# Patient Record
Sex: Female | Born: 1960 | Hispanic: No | Smoking: Light tobacco smoker
Health system: Southern US, Community
[De-identification: ages and names within clinical notes are randomized; demographics above are authoritative.]

## PROBLEM LIST (undated history)

## (undated) DIAGNOSIS — F419 Anxiety disorder, unspecified: Secondary | ICD-10-CM

## (undated) DIAGNOSIS — E119 Type 2 diabetes mellitus without complications: Secondary | ICD-10-CM

## (undated) DIAGNOSIS — I10 Essential (primary) hypertension: Secondary | ICD-10-CM

## (undated) HISTORY — PX: CHOLECYSTECTOMY: SHX55

## (undated) HISTORY — PX: APPENDECTOMY: SHX54

---

## 2004-05-29 ENCOUNTER — Encounter (INDEPENDENT_AMBULATORY_CARE_PROVIDER_SITE_OTHER): Payer: Self-pay | Admitting: Specialist

## 2004-05-29 ENCOUNTER — Ambulatory Visit (HOSPITAL_COMMUNITY): Admission: RE | Admit: 2004-05-29 | Discharge: 2004-05-29 | Payer: Self-pay | Admitting: Obstetrics and Gynecology

## 2004-09-28 ENCOUNTER — Encounter: Admission: RE | Admit: 2004-09-28 | Discharge: 2004-09-28 | Payer: Self-pay | Admitting: Family Medicine

## 2004-10-19 ENCOUNTER — Ambulatory Visit: Admission: RE | Admit: 2004-10-19 | Discharge: 2004-10-19 | Payer: Self-pay | Admitting: General Surgery

## 2004-10-19 ENCOUNTER — Encounter (INDEPENDENT_AMBULATORY_CARE_PROVIDER_SITE_OTHER): Payer: Self-pay | Admitting: Specialist

## 2004-12-19 ENCOUNTER — Other Ambulatory Visit: Admission: RE | Admit: 2004-12-19 | Discharge: 2004-12-19 | Payer: Self-pay | Admitting: Obstetrics and Gynecology

## 2005-02-08 ENCOUNTER — Encounter: Admission: RE | Admit: 2005-02-08 | Discharge: 2005-02-08 | Payer: Self-pay | Admitting: Family Medicine

## 2006-07-29 ENCOUNTER — Other Ambulatory Visit: Admission: RE | Admit: 2006-07-29 | Discharge: 2006-07-29 | Payer: Self-pay | Admitting: Obstetrics and Gynecology

## 2007-01-29 IMAGING — CR DG KNEE 1-2V*R*
3 series · 3 of 3 positions shown · non-contrast
Comparison: none

CLINICAL DATA: Right knee pain and swelling for two days.  No acute injury.  
 RIGHT KNEE TWO VIEWS:
 No comparison.  The frontal exam was repeated.  There is a moderate sized knee joint effusion.  There is mild medial and patellar femoral joint space loss with osteophytes.  No acute fracture or dislocation is seen.

[view not recorded (1 of 3)]
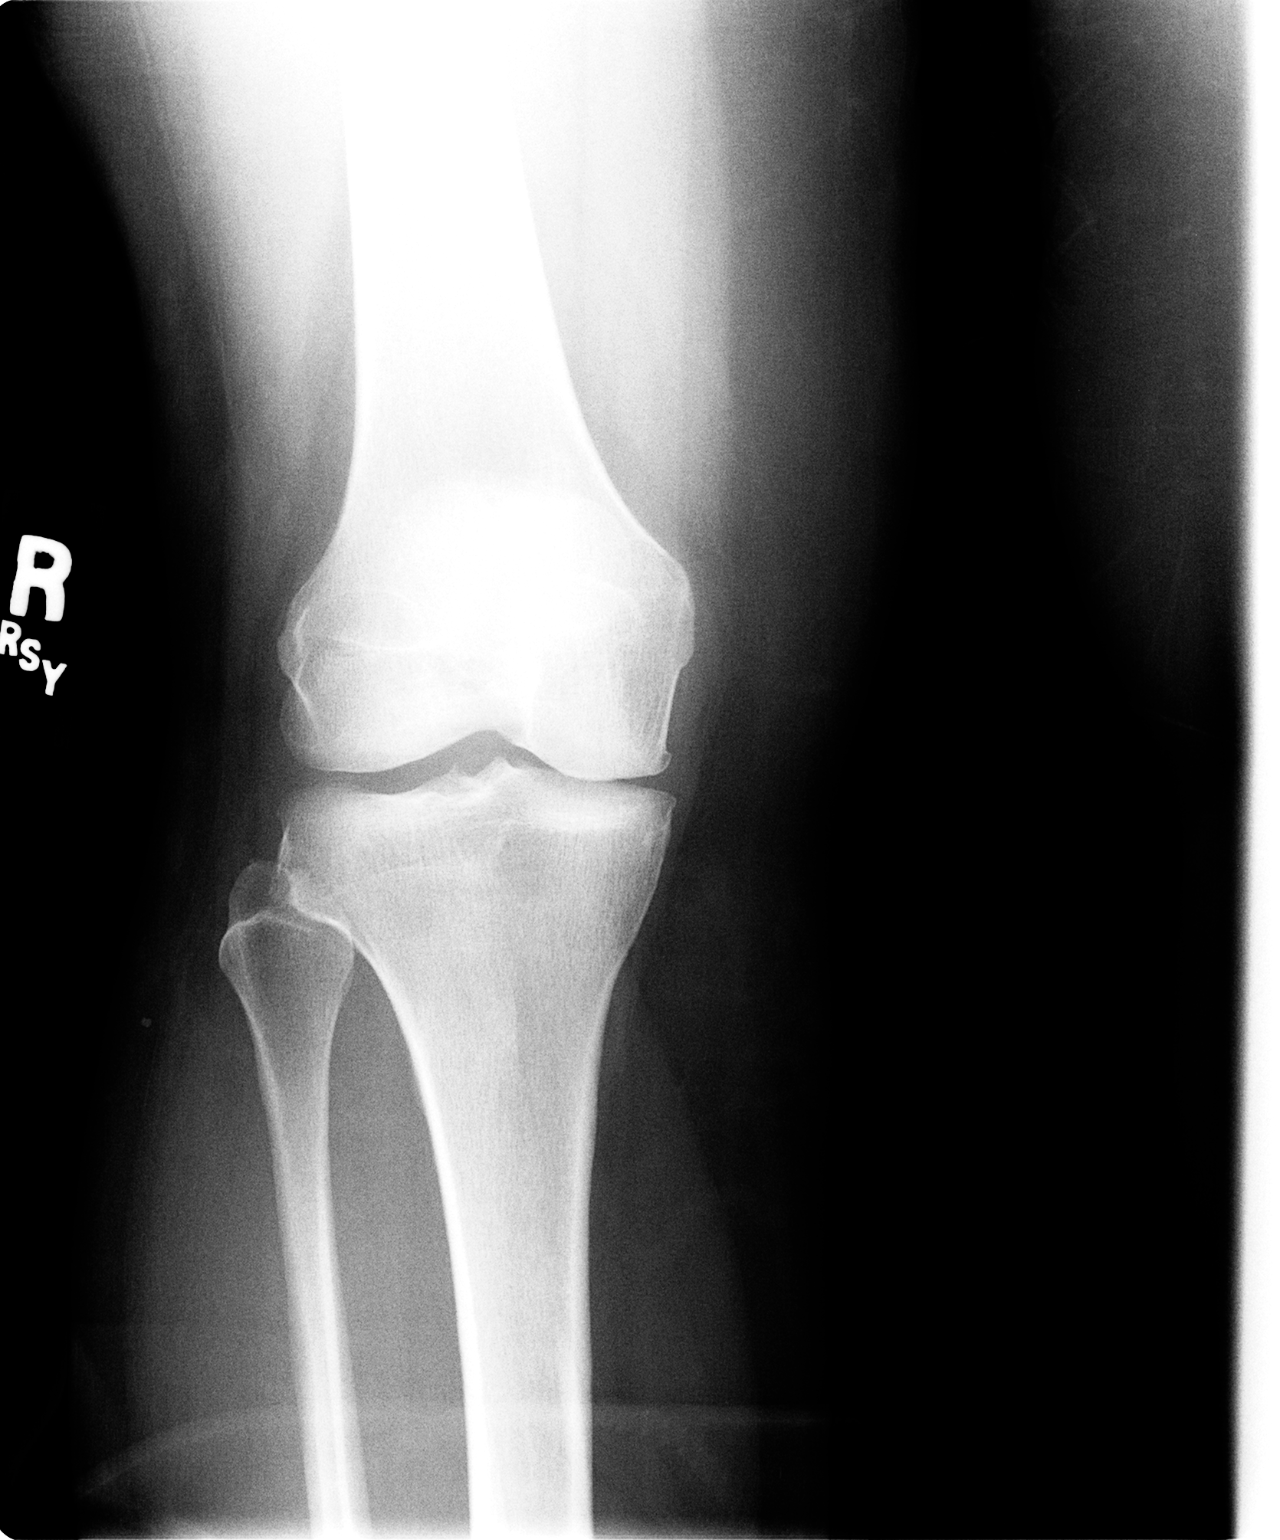

[view not recorded (2 of 3)]
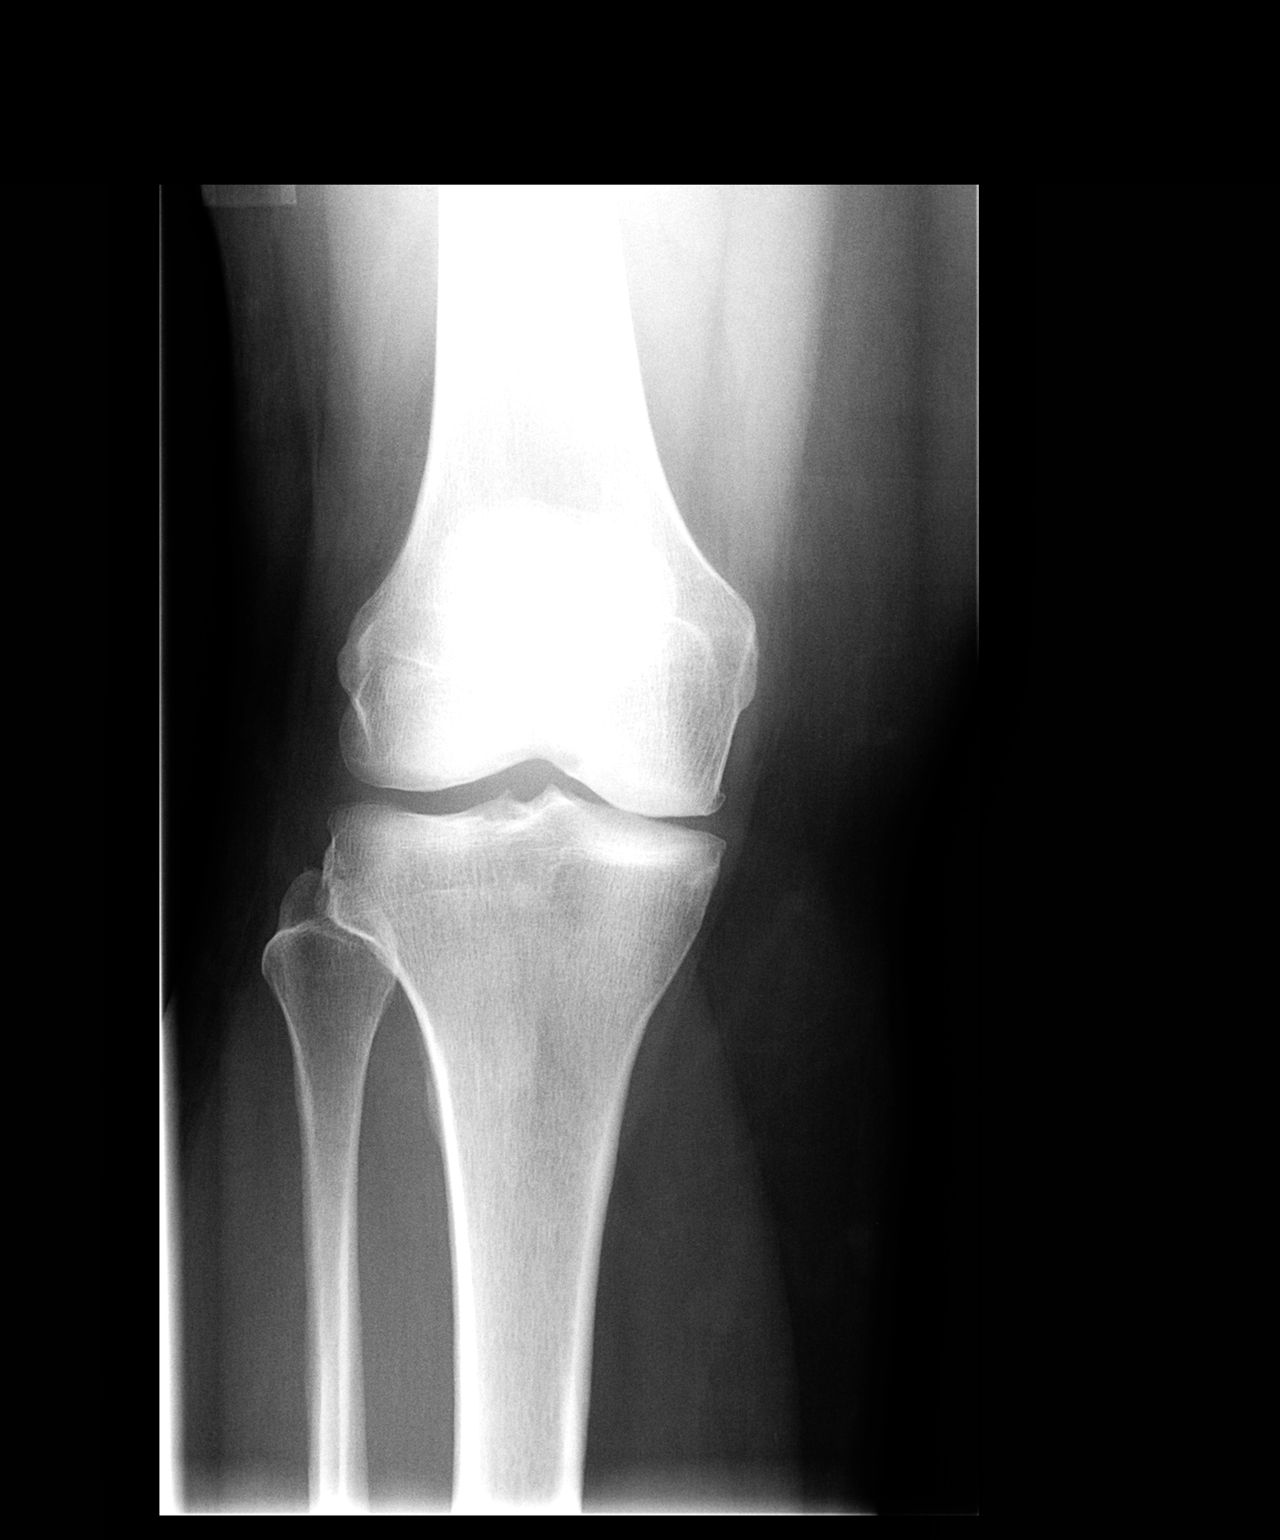

[view not recorded (3 of 3)]
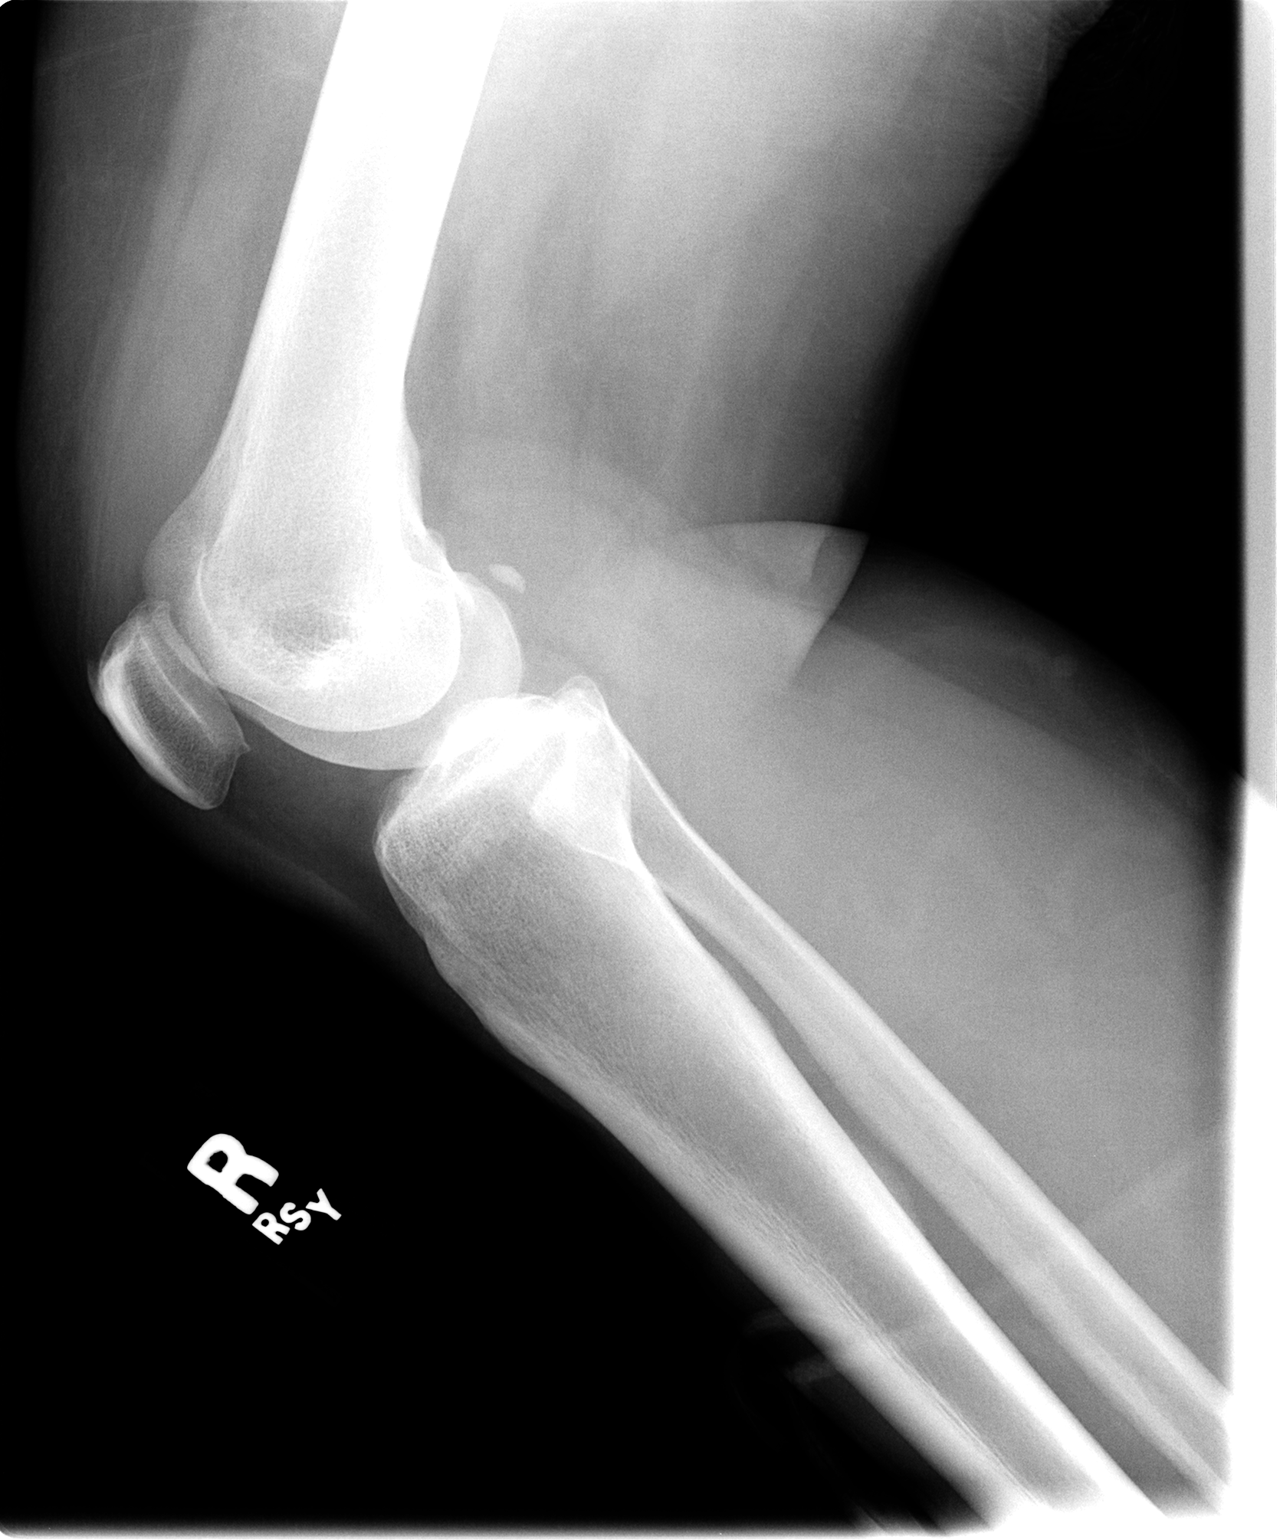

[3 of 3 positions shown; findings below may reference images not displayed]

IMPRESSION: Knee joint effusion with mild degenerative changes.  No acute osseous findings.

## 2010-07-07 NOTE — Op Note (Signed)
Angela Burch, Angela Burch               ACCOUNT NO.:  [id number]   MEDICAL RECORD NO.:  [id number]          PATIENT TYPE:  AMB   LOCATION:  DAY                          FACILITY:  [facility]   PHYSICIAN:  Cynthia P. Romine, M.D.DATE OF BIRTH:  [birth year]   DATE OF PROCEDURE:  05/29/2004  DATE OF DISCHARGE:                                 OPERATIVE REPORT   PREOPERATIVE DIAGNOSES:  Menorrhagia, submucous fibroid.   POSTOPERATIVE DIAGNOSES:  Menorrhagia, submucous fibroid, pathology pending.   PROCEDURE:  Endometrial ablation using HTA and a hysteroscopic resection of  submucous myoma.   SURGEON:  Cynthia P. Romine, M.D.   ANESTHESIA:  General by LMA   ESTIMATED BLOOD LOSS:  Minimal.   COMPLICATIONS:  None.   DESCRIPTION OF PROCEDURE:  The patient was taken to the operating room, and  after the induction of adequate general anesthesia by LMA, she was placed in  the dorsal lithotomy position and prepped and draped in the usual fashion.  The bladder was drained with a red rubber catheter.  A posterior weighted  and an anterior Sims retractor were placed.  The cervix was grasped on its  anterior lip with a single-tooth tenaculum.  The uterus was sounded to 7 cm.  The cervix was dilated to a #23 Shawnie Pons.  The ablated hysteroscope was  introduced.  Proper documentation was noted by seeing the tubal ostia and  the submucous fibroid.  The submucous fibroid was approximately 2 cm, and  originated from the lower part of the anterior uterine segment.  The  hysteroscope was withdrawn to just inside the endo-cervical os, and an  endometrial ablation was carried out using the endometrial hydrotherm  ablator without difficulty, according to the usual fashion.  Following a 10-  minute ablation, the fundus was inspected and it was felt that there were  areas within the fundus that were not adequately ablated.  A two-minute re-  ablation was then done with good results.  The HTA scope was now withdrawn.  The standard operative hysteroscope was then inserted.  Sorbitol was used as  the distention medium.  The pressure pump was set on 70 mmHg.  A single loop  was used to resect the lower uterine segment anterior fibroid in pieces.  The specimen was sent to pathology.  After removing the fibroid, the scope  was withdrawn.  A sharp curettage was done to remove some fragments of  ablated tissue that were shaggy in the endometrial cavity.  The scope was  then put back in.  The cavity was cleaned up with a single loop to make the  site where the fibroid was smooth.  The bleeding sites were  coagulated.  The scope was then withdrawn.  The instruments were removed  from the vagina.  The procedure was terminated.   The patient tolerated the procedure well and went in satisfactory condition  to the post-anesthesia recovery.      CPR/MEDQ  D:  05/29/2004  T:  05/29/2004  Job:  [id number]

## 2010-07-07 NOTE — Op Note (Signed)
NAMEHADYN, Angela Burch NO.:  [id number]   MEDICAL RECORD NO.:  [id number]          PATIENT TYPE:  INP   LOCATION:  0005                         FACILITY:  [facility]   PHYSICIAN:  Leonie Man, M.D.   DATE OF BIRTH:  [birth year]   DATE OF PROCEDURE:  10/19/2004  DATE OF DISCHARGE:                                 OPERATIVE REPORT   PREOPERATIVE DIAGNOSIS:  Chronic calculous cholecystitis.   POSTOPERATIVE DIAGNOSIS:  Chronic calculous cholecystitis.   PROCEDURES:  Laparoscopic cholecystectomy with intraoperative cholangiogram.   SURGEON:  Leonie Man, M.D.   ASSISTANT:  Avel Peace, M.D.   ANESTHESIA:  General.   SPECIMENS TO LAB:  Gallbladder with stones.   ESTIMATED BLOOD LOSS:  Minimal.   COMPLICATIONS:  No complications apparent.   The patient returned to the PACU in good condition.   Note, Angela Burch is a 50 year old diabetic woman with longstanding right  upper quadrant symptoms associated with fatty food intolerance. She had an  ultrasound which shows that she has a single gallstone impacted in the neck  of the gallbladder. She comes to the operating room after the risk and  potential benefits of surgery have been discussed. All questions answered  and consent obtained.   DESCRIPTION OF PROCEDURE:  Following the induction of satisfactory general  anesthesia with the patient positioned supinely, the abdomen was routinely  prepped and draped to be included in a sterile operative field. Open  laparoscopy is then created at the umbilicus with insertion of a Hassan  cannula, insufflation of the peritoneal cavity to 14 mmHg pressure using  carbon dioxide. The camera was then inserted and visual exploration of the  abdomen carried out. There were a few adhesions in the pelvis. The liver  edges were somewhat fatty in appearance. The gallbladder was chronically  scarred with multiple adhesions up to the wall of the gallbladder. Under  direct vision,  epigastric and lateral ports were placed, the gallbladder is  grasped and retracted cephalad. Dissection carried down to the region of the  ampulla of the gallbladder, isolated the cystic artery and cystic duct. The  cystic duct was clipped proximally and opened and the cystic duct  cholangiogram was carried out with a Cook catheter which was inserted into  the cystic duct with the injection of one-half strength Hypaque into the  biliary system. The resulting cholangiogram showed free flow of contrast  into the duodenum with no filling defects and normal caliber ductal system.  The cystic duct was then doubly clipped and transected, the cystic artery  was dissected free, doubly clipped and transected and the gallbladder  dissected free from the liver bed using electrocautery and maintaining  hemostasis throughout the course of the dissection. At the end of the  dissection, additional bleeding points within the liver bed were treated  with electrocautery. The camera was then moved to the epigastric port, the  gallbladder was retrieved through the umbilical port without difficulty. The  right upper quadrant was again thoroughly irrigated and sucked dry. Sponge,  instrument and sharp counts were verified. The pneumoperitoneum allowed to  deflate  and the wound was closed in layers as follows:  The umbilical wound  closed in two layers with #0 Vicryl and 4-0 Monocryl. The epigastric and  lateral flank wounds were closed with 4-0 Monocryl sutures and all wounds  reinforced with Steri-Strips and a sterile dressing was applied. Anesthetic  reversed, patient removed from the operating room to the recovery room in  stable condition. She tolerated the procedure well.           ______________________________  Leonie Man, M.D.     PB/MEDQ  D:  10/19/2004  T:  10/19/2004  Job:  [id number]

## 2011-06-06 ENCOUNTER — Telehealth (INDEPENDENT_AMBULATORY_CARE_PROVIDER_SITE_OTHER): Payer: Self-pay

## 2011-06-07 NOTE — Telephone Encounter (Signed)
Encounter opened in error

## 2011-12-15 ENCOUNTER — Emergency Department (HOSPITAL_COMMUNITY)
Admission: EM | Admit: 2011-12-15 | Discharge: 2011-12-15 | Disposition: A | Discharge status: Home / Self Care | Payer: Self-pay | Attending: Emergency Medicine | Admitting: Emergency Medicine

## 2011-12-15 ENCOUNTER — Encounter (HOSPITAL_COMMUNITY): Payer: Self-pay | Admitting: Emergency Medicine

## 2011-12-15 DIAGNOSIS — R350 Frequency of micturition: Secondary | ICD-10-CM | POA: Insufficient documentation

## 2011-12-15 DIAGNOSIS — F329 Major depressive disorder, single episode, unspecified: Secondary | ICD-10-CM | POA: Insufficient documentation

## 2011-12-15 DIAGNOSIS — F17200 Nicotine dependence, unspecified, uncomplicated: Secondary | ICD-10-CM | POA: Insufficient documentation

## 2011-12-15 DIAGNOSIS — R404 Transient alteration of awareness: Secondary | ICD-10-CM | POA: Insufficient documentation

## 2011-12-15 DIAGNOSIS — W06XXXA Fall from bed, initial encounter: Secondary | ICD-10-CM | POA: Insufficient documentation

## 2011-12-15 DIAGNOSIS — E162 Hypoglycemia, unspecified: Secondary | ICD-10-CM

## 2011-12-15 DIAGNOSIS — Y929 Unspecified place or not applicable: Secondary | ICD-10-CM | POA: Insufficient documentation

## 2011-12-15 DIAGNOSIS — F3289 Other specified depressive episodes: Secondary | ICD-10-CM | POA: Insufficient documentation

## 2011-12-15 DIAGNOSIS — I10 Essential (primary) hypertension: Secondary | ICD-10-CM | POA: Insufficient documentation

## 2011-12-15 DIAGNOSIS — E1169 Type 2 diabetes mellitus with other specified complication: Secondary | ICD-10-CM | POA: Insufficient documentation

## 2011-12-15 DIAGNOSIS — Y939 Activity, unspecified: Secondary | ICD-10-CM | POA: Insufficient documentation

## 2011-12-15 HISTORY — DX: Type 2 diabetes mellitus without complications: E11.9

## 2011-12-15 HISTORY — DX: Essential (primary) hypertension: I10

## 2011-12-15 LAB — CBC WITH DIFFERENTIAL/PLATELET
Basophils Absolute: 0 10*3/uL (ref 0.0–0.1)
Eosinophils Relative: 1 % (ref 0–5)
HCT: 42.3 % (ref 36.0–46.0)
Hemoglobin: 14.2 g/dL (ref 12.0–15.0)
Lymphocytes Relative: 22 % (ref 12–46)
MCHC: 33.6 g/dL (ref 30.0–36.0)
MCV: 81.7 fL (ref 78.0–100.0)
Monocytes Absolute: 0.6 10*3/uL (ref 0.1–1.0)
Monocytes Relative: 6 % (ref 3–12)
Neutro Abs: 6.6 10*3/uL (ref 1.7–7.7)
RDW: 14.3 % (ref 11.5–15.5)
WBC: 9.3 10*3/uL (ref 4.0–10.5)

## 2011-12-15 LAB — BASIC METABOLIC PANEL
BUN: 11 mg/dL (ref 6–23)
CO2: 19 mEq/L (ref 19–32)
Calcium: 8.9 mg/dL (ref 8.4–10.5)
Chloride: 102 mEq/L (ref 96–112)
Creatinine, Ser: 0.45 mg/dL — ABNORMAL LOW (ref 0.50–1.10)

## 2011-12-15 LAB — URINALYSIS, ROUTINE W REFLEX MICROSCOPIC
Bilirubin Urine: NEGATIVE
Glucose, UA: 250 mg/dL — AB
Hgb urine dipstick: NEGATIVE
Ketones, ur: NEGATIVE mg/dL
Protein, ur: NEGATIVE mg/dL
pH: 5 (ref 5.0–8.0)

## 2011-12-15 LAB — GLUCOSE, CAPILLARY
Glucose-Capillary: 92 mg/dL (ref 70–99)
Glucose-Capillary: 128 mg/dL — ABNORMAL HIGH (ref 70–99)
Glucose-Capillary: 72 mg/dL (ref 70–99)

## 2011-12-15 LAB — RAPID URINE DRUG SCREEN, HOSP PERFORMED
Cocaine: NOT DETECTED
Opiates: NOT DETECTED
Tetrahydrocannabinol: NOT DETECTED

## 2011-12-15 MED ORDER — SODIUM CHLORIDE 0.9 % IV SOLN
1000.0000 mL | Freq: Once | INTRAVENOUS | Status: AC
  Administered 2011-12-15: 1000 mL via INTRAVENOUS

## 2011-12-15 MED ORDER — LORAZEPAM 1 MG PO TABS
1.0000 mg | ORAL_TABLET | Freq: Once | ORAL | Status: AC
  Administered 2011-12-15: 1 mg via ORAL
  Filled 2011-12-15: qty 1

## 2011-12-15 MED ORDER — LISINOPRIL 10 MG PO TABS
10.0000 mg | ORAL_TABLET | Freq: Every day | ORAL | Status: DC
  Administered 2011-12-15: 10 mg via ORAL
  Filled 2011-12-15: qty 1

## 2011-12-15 MED ORDER — SODIUM CHLORIDE 0.9 % IV SOLN: 1000.0000 mL | INTRAVENOUS | Status: DC

## 2011-12-15 MED ORDER — ALUM & MAG HYDROXIDE-SIMETH 200-200-20 MG/5ML PO SUSP: 30.0000 mL | ORAL | Status: DC | PRN

## 2011-12-15 MED ORDER — PAROXETINE HCL 20 MG PO TABS
40.0000 mg | ORAL_TABLET | Freq: Every day | ORAL | Status: DC
  Administered 2011-12-15: 40 mg via ORAL
  Filled 2011-12-15: qty 2

## 2011-12-15 MED ORDER — CLONAZEPAM 0.5 MG PO TABS
1.0000 mg | ORAL_TABLET | ORAL | Status: AC
  Administered 2011-12-15: 1 mg via ORAL
  Filled 2011-12-15: qty 2

## 2011-12-15 MED ORDER — IBUPROFEN 200 MG PO TABS: 600.0000 mg | ORAL_TABLET | Freq: Three times a day (TID) | ORAL | Status: DC | PRN

## 2011-12-15 MED ORDER — NICOTINE 21 MG/24HR TD PT24
21.0000 mg | MEDICATED_PATCH | Freq: Every day | TRANSDERMAL | Status: DC
  Filled 2011-12-15: qty 1

## 2011-12-15 MED ORDER — CLONAZEPAM 0.5 MG PO TABS: 1.0000 mg | ORAL_TABLET | Freq: Three times a day (TID) | ORAL | Status: DC | PRN

## 2011-12-15 MED ORDER — SODIUM CHLORIDE 0.9 % IV SOLN: 1000.0000 mL | Freq: Once | INTRAVENOUS | Status: DC

## 2011-12-15 MED ORDER — ZOLPIDEM TARTRATE 5 MG PO TABS: 5.0000 mg | ORAL_TABLET | Freq: Every evening | ORAL | Status: DC | PRN

## 2011-12-15 MED ORDER — ACETAMINOPHEN 325 MG PO TABS: 650.0000 mg | ORAL_TABLET | ORAL | Status: DC | PRN

## 2011-12-15 MED ORDER — ONDANSETRON HCL 4 MG PO TABS: 4.0000 mg | ORAL_TABLET | Freq: Three times a day (TID) | ORAL | Status: DC | PRN

## 2011-12-15 MED ORDER — BUPROPION HCL 100 MG PO TABS
100.0000 mg | ORAL_TABLET | Freq: Two times a day (BID) | ORAL | Status: DC
  Filled 2011-12-15: qty 1

## 2011-12-15 MED ORDER — INSULIN NPH (HUMAN) (ISOPHANE) 100 UNIT/ML ~~LOC~~ SUSP: 40.0000 [IU] | Freq: Two times a day (BID) | SUBCUTANEOUS | Status: DC

## 2011-12-15 MED ORDER — LORAZEPAM 1 MG PO TABS: 1.0000 mg | ORAL_TABLET | Freq: Three times a day (TID) | ORAL | Status: DC | PRN

## 2011-12-15 MED ORDER — AMPHETAMINE-DEXTROAMPHETAMINE 10 MG PO TABS: 30.0000 mg | ORAL_TABLET | Freq: Two times a day (BID) | ORAL | Status: DC

## 2011-12-15 NOTE — ED Notes (Signed)
MD at bedside. 

## 2011-12-15 NOTE — ED Notes (Signed)
AVW:[id number]<XB> Expected date:12/15/11<BR> Expected time:10:36 AM<BR> Means of arrival:Ambulance<BR> Comments:<BR> Hypoglycemia

## 2011-12-15 NOTE — ED Provider Notes (Signed)
History     CSN: [id number]  Arrival date & time 12/15/11  1034   First MD Initiated Contact with Patient 12/15/11 1051      Chief Complaint  Patient presents with  . Hypoglycemia    (Consider location/radiation/quality/duration/timing/severity/associated sxs/prior treatment) HPI Comments: Patient was found unresponsive this morning by friend who heard her fall out of bed this morning.  EMS was called and found her cbg to be 18.  Daughter was also present, states patient was clammy, cold, and moaning.  EMS gave D50 and cbg increased to 93.  Daughter reports patient takes novolin 60 units BID.  Patient last remembers taking novolin yesterday morning.  Yesterday she ate toast and cereal for breakfast and pasta with sausage for dinner.  Pt has had one prior episode of hypoglycemia last year, cbg found to be 23 but pt states she was asymptomatic.  Pt notes she has had urinary frequency over the past few days.  Currently feels tired and cold.  Denies fevers, sore throat, cough, SOB, CP, abdominal pain, N/V/D, urinary urgency or dysuria.  Daughter notes patient's blood glucose is usually in the mid-100s, though she does not check it very often.   The history is provided by the patient and a relative.    Past Medical History  Diagnosis Date  . Diabetes mellitus without complication   . Hypertension     History reviewed. No pertinent past surgical history.  No family history on file.  History  Substance Use Topics  . Smoking status: Light Tobacco Smoker  . Smokeless tobacco: Not on file  . Alcohol Use: No    OB History    Grav Para Term Preterm Abortions TAB SAB Ect Mult Living                  Review of Systems  Constitutional: Negative for fever and chills.  HENT: Negative for sore throat.   Respiratory: Negative for cough and shortness of breath.   Cardiovascular: Negative for chest pain.  Gastrointestinal: Negative for nausea, vomiting, abdominal pain and diarrhea.    Genitourinary: Positive for frequency. Negative for dysuria, urgency, vaginal bleeding and vaginal discharge.  Psychiatric/Behavioral: Positive for confusion.    Allergies  Sulfa antibiotics  Home Medications  No current outpatient prescriptions on file.  BP 128/92  Pulse 56  Temp 97.5 F (36.4 C) (Rectal)  Resp 16  SpO2 100%  Physical Exam  Nursing note and vitals reviewed. Constitutional: She appears well-developed and well-nourished. No distress.  HENT:  Head: Normocephalic and atraumatic.  Neck: Neck supple.  Cardiovascular: Normal rate and regular rhythm.   Pulmonary/Chest: Effort normal and breath sounds normal. No respiratory distress. She has no wheezes. She has no rales.  Abdominal: Soft. She exhibits no distension. There is no tenderness. There is no rebound and no guarding.  Neurological: She is alert. She has normal strength. No cranial nerve deficit or sensory deficit. She exhibits normal muscle tone. Coordination and gait normal. GCS eye subscore is 4. GCS verbal subscore is 5. GCS motor subscore is 6.       CN II-XII intact, EOMs intact, no pronator drift, grip strengths equal bilaterally; strength 5/5 in all extremities, sensation intact in all extremities; finger to nose, heel to shin, rapid alternating movements normal; gait is normal.    Pt is alert and cooperative, answers all questions, but appears mildly confused in terms of short term memory (e.g. yesterday's events).    Skin: She is not diaphoretic.  ED Course  Procedures (including critical care time)  Labs Reviewed  CBC WITH DIFFERENTIAL - Abnormal; Notable for the following:    RBC 5.18 (*)     All other components within normal limits  BASIC METABOLIC PANEL - Abnormal; Notable for the following:    Glucose, Bld 69 (*)     Creatinine, Ser 0.45 (*)     All other components within normal limits  URINALYSIS, ROUTINE W REFLEX MICROSCOPIC - Abnormal; Notable for the following:    APPearance  CLOUDY (*)     Glucose, UA 250 (*)     All other components within normal limits  GLUCOSE, CAPILLARY - Abnormal; Notable for the following:    Glucose-Capillary 119 (*)     All other components within normal limits  GLUCOSE, CAPILLARY - Abnormal; Notable for the following:    Glucose-Capillary 134 (*)     All other components within normal limits  GLUCOSE, CAPILLARY - Abnormal; Notable for the following:    Glucose-Capillary 128 (*)     All other components within normal limits  URINE RAPID DRUG SCREEN (HOSP PERFORMED) - Abnormal; Notable for the following:    Benzodiazepines POSITIVE (*)     Amphetamines POSITIVE (*)     All other components within normal limits  GLUCOSE, CAPILLARY  ETHANOL  GLUCOSE, CAPILLARY   No results found.  11:22 AM Pt discussed with Dr Ethelda Chick who will also see the patient.   1:11 PM Patient reports she is having a panic attack, is crying.  States she feels sad, wants to see her mom (she is dead).  States she is depressed.  Denies SI.  Pt does have a hx panic attacks.   3:38 PM Daughter asked to speak to me privately, states that patient has repeatedly said that she wants to go see her mother (who is dead) and is saying things like "I'm ready for this life to be over."  Daughter also notes that 1-2 weeks ago, patient told her she took 300 units of insulin and the next morning states "why the hell did I wake up?  I shouldn't be here.  I took 300 units of insulin."  Daughter is concerned and does not feel comfortable with patient being discharged and does not feel comfortable leaving patient alone.  I have discussed this with Dr Ethelda Chick and will keep patient, concern for suicidal ideation, possible suicide attempt last night given cbg 18 this morning.    3:46 PM I spoke with Aurther Loft, ACT team, who will add patient to her list for assessment.   5:24 PM Pt was attempting to leave.  I have informed her that she may stay voluntarily or we will fill out IVC papers  for her to stay.  Pt agrees to stay.    7:54 PM Patient spoke with telepsych. Psychiatrist recommends discharge.  Discussed with Dr Rhunette Croft who agrees with following psychiatrist's recommendations.        1. Hypoglycemia   2. Depression     MDM  Patient found unresponsive this morning, cbg found to be 18.  Patient given D50 by EMS with return to normal function and mental status.  Pt ate in ED and has maintained normal blood glucose throughout visit.  There was concern expressed by the daughter of suicidal ideas given attempted suicide last week.  Pt spoke to telepsych MD who recommends discharge home.  Pt instructed to follow up with her PCP, given resources as backup, also given information for [facility].  Per  telepsych, pt has "ANP" that she sees for her depression, instructed to follow up with this person.  Discussed plan with patient and family members.  Family members state they will monitor patient closely.  Pt advised to change her insulin dosage, follow closely with PCP.  Pt given return precautions.  Pt verbalizes understanding and agrees with plan.           [place], [place] 12/15/11 2042

## 2011-12-15 NOTE — ED Provider Notes (Addendum)
Patient found unresponsive this morning. Found to have blood sugar of 18 by EMS treated with D50. Patient presently asymptomatic alert Glasgow Coma Score 15. She did denies missing recent meals and denies recent changes in insulin regimen  Doug Sou, MD 12/15/11 1143  Further history patient reports that she has been sad lately over the lost her job and other issues at home. She denies suicidal ideation but she did attempt to harm herself one week ago by overdosing on insulin. She vehemently denies overdose on insulin last night. We will obtain tele psychiatric consult to evaluate patient  Doug Sou, MD 12/15/11 1610

## 2011-12-15 NOTE — ED Notes (Signed)
Bedside report received from previous RN 

## 2011-12-15 NOTE — ED Notes (Signed)
This RN removed pts cell phone in pink case along with [place] drivers license, [employer] debit card. RN will give to pt family upon their return.

## 2011-12-15 NOTE — ED Notes (Signed)
Per EMS: Pt fell out of bed this morning, BS was 18. 20g in left A, D50 given.

## 2011-12-15 NOTE — ED Notes (Signed)
Pt refused nicotine patch. Pt very argumentive with staff.

## 2011-12-15 NOTE — ED Notes (Signed)
Cell phone, debit card and license given to security guard to secure in personal belongings envelope.

## 2011-12-15 NOTE — ED Notes (Signed)
Tele-psych info faxed. 

## 2011-12-15 NOTE — ED Notes (Signed)
Pt still tearful but denies suicidal thoughts.

## 2011-12-16 NOTE — ED Provider Notes (Signed)
Medical screening examination/treatment/procedure(s) were conducted as a shared visit with non-physician practitioner(s) and myself.  I personally evaluated the patient during the encounter  Doug Sou, MD 12/16/11 [phone]

## 2013-11-30 ENCOUNTER — Emergency Department (HOSPITAL_COMMUNITY): Payer: Self-pay

## 2013-11-30 ENCOUNTER — Emergency Department (HOSPITAL_COMMUNITY)
Admission: EM | Admit: 2013-11-30 | Discharge: 2013-11-30 | Disposition: A | Discharge status: Home / Self Care | Payer: Self-pay | Attending: Emergency Medicine | Admitting: Emergency Medicine

## 2013-11-30 ENCOUNTER — Encounter (HOSPITAL_COMMUNITY): Payer: Self-pay | Admitting: Emergency Medicine

## 2013-11-30 DIAGNOSIS — Z720 Tobacco use: Secondary | ICD-10-CM | POA: Insufficient documentation

## 2013-11-30 DIAGNOSIS — W1839XA Other fall on same level, initial encounter: Secondary | ICD-10-CM | POA: Insufficient documentation

## 2013-11-30 DIAGNOSIS — S82831A Other fracture of upper and lower end of right fibula, initial encounter for closed fracture: Secondary | ICD-10-CM | POA: Insufficient documentation

## 2013-11-30 DIAGNOSIS — S82202A Unspecified fracture of shaft of left tibia, initial encounter for closed fracture: Secondary | ICD-10-CM

## 2013-11-30 DIAGNOSIS — Z79899 Other long term (current) drug therapy: Secondary | ICD-10-CM | POA: Insufficient documentation

## 2013-11-30 DIAGNOSIS — I10 Essential (primary) hypertension: Secondary | ICD-10-CM | POA: Insufficient documentation

## 2013-11-30 DIAGNOSIS — S82402A Unspecified fracture of shaft of left fibula, initial encounter for closed fracture: Secondary | ICD-10-CM

## 2013-11-30 DIAGNOSIS — E119 Type 2 diabetes mellitus without complications: Secondary | ICD-10-CM | POA: Insufficient documentation

## 2013-11-30 DIAGNOSIS — Y9289 Other specified places as the place of occurrence of the external cause: Secondary | ICD-10-CM | POA: Insufficient documentation

## 2013-11-30 DIAGNOSIS — S8254XA Nondisplaced fracture of medial malleolus of right tibia, initial encounter for closed fracture: Secondary | ICD-10-CM | POA: Insufficient documentation

## 2013-11-30 DIAGNOSIS — Z794 Long term (current) use of insulin: Secondary | ICD-10-CM | POA: Insufficient documentation

## 2013-11-30 DIAGNOSIS — Y9389 Activity, other specified: Secondary | ICD-10-CM | POA: Insufficient documentation

## 2013-11-30 MED ORDER — HYDROMORPHONE HCL 1 MG/ML IJ SOLN
1.0000 mg | Freq: Once | INTRAMUSCULAR | Status: AC
  Administered 2013-11-30: 1 mg via INTRAMUSCULAR
  Filled 2013-11-30: qty 1

## 2013-11-30 MED ORDER — ONDANSETRON 4 MG PO TBDP
4.0000 mg | ORAL_TABLET | Freq: Once | ORAL | Status: AC
  Administered 2013-11-30: 4 mg via ORAL
  Filled 2013-11-30: qty 1

## 2013-11-30 MED ORDER — OXYCODONE-ACETAMINOPHEN 5-325 MG PO TABS
1.0000 | ORAL_TABLET | Freq: Once | ORAL | Status: AC
  Administered 2013-11-30: 1 via ORAL
  Filled 2013-11-30: qty 1

## 2013-11-30 MED ORDER — OXYCODONE-ACETAMINOPHEN 5-325 MG PO TABS: 1.0000 | ORAL_TABLET | Freq: Four times a day (QID) | ORAL | Status: DC | PRN

## 2013-11-30 NOTE — ED Provider Notes (Signed)
CSN: [id number]     Arrival date & time 11/30/13  1937 History  This chart was scribed for non-physician practitioner, Terri Piedraourtney Forcucci, PA-C working with Rolland PorterMark James, MD by Greggory StallionKayla Andersen, ED scribe. This patient was seen in room TR09C/TR09C and the patient's care was started at 10:07 PM.    Chief Complaint  Patient presents with  . Ankle Injury   The history is provided by the patient. No language interpreter was used.   HPI Comments: Angela Burch is a 53 y.o. female who presents to the Emergency Department complaining of left ankle injury that occurred last night. States she stepped on an acorn, twisted her ankle and fell down. Reports sudden onset, worsening pain with associated swelling. Bearing weight and movements worsen the pain. She has iced and elevated with no relief.   Past Medical History  Diagnosis Date  . Diabetes mellitus without complication   . Hypertension    Past Surgical History  Procedure Laterality Date  . Appendectomy    . Cholecystectomy     History reviewed. No pertinent family history. History  Substance Use Topics  . Smoking status: Light Tobacco Smoker  . Smokeless tobacco: Never Used  . Alcohol Use: No   OB History   Grav Para Term Preterm Abortions TAB SAB Ect Mult Living                 Review of Systems  Musculoskeletal: Positive for arthralgias and joint swelling.  All other systems reviewed and are negative.  Allergies  Sulfa antibiotics  Home Medications   Prior to Admission medications   Medication Sig Start Date End Date Taking? Authorizing Provider  amphetamine-dextroamphetamine (ADDERALL) 30 MG tablet Take 30 mg by mouth 2 (two) times daily.   Yes Historical Provider, MD  buPROPion (WELLBUTRIN) 100 MG tablet Take 100 mg by mouth 2 (two) times daily.   Yes Historical Provider, MD  clonazePAM (KLONOPIN) 1 MG tablet Take 1 mg by mouth 3 (three) times daily as needed. For anxiety.   Yes Historical Provider, MD  insulin NPH Human  (HUMULIN N,NOVOLIN N) 100 UNIT/ML injection Inject 61 Units into the skin 2 (two) times daily. 12/15/11  Yes Trixie DredgeEmily West, PA-C  lisinopril (PRINIVIL,ZESTRIL) 10 MG tablet Take 10 mg by mouth daily.   Yes Historical Provider, MD  PARoxetine (PAXIL) 40 MG tablet Take 40 mg by mouth daily.   Yes Historical Provider, MD  oxyCODONE-acetaminophen (PERCOCET/ROXICET) 5-325 MG per tablet Take 1-2 tablets by mouth every 6 (six) hours as needed for moderate pain or severe pain. 11/30/13   Latif Nazareno A Forcucci, PA-C   BP 129/60  Pulse 68  Temp(Src) 98.6 F (37 C) (Oral)  Resp 15  Wt 171 lb (77.565 kg)  SpO2 99%  Physical Exam  Nursing note and vitals reviewed. Constitutional: She is oriented to person, place, and time. She appears well-developed and well-nourished. No distress.  HENT:  Head: Normocephalic and atraumatic.  Mouth/Throat: Oropharynx is clear and moist.  Eyes: Conjunctivae and EOM are normal. Pupils are equal, round, and reactive to light.  Neck: Normal range of motion. Neck supple. No tracheal deviation present. No thyromegaly present.  Cardiovascular: Normal rate, regular rhythm, normal heart sounds and intact distal pulses.  Exam reveals no gallop and no friction rub.   No murmur heard. Pulmonary/Chest: Effort normal and breath sounds normal. No respiratory distress. She has no wheezes. She has no rales. She exhibits no tenderness.  Musculoskeletal:       Left ankle:  She exhibits decreased range of motion, swelling and deformity. She exhibits no ecchymosis, no laceration and normal pulse. Tenderness. Lateral malleolus and medial malleolus tenderness found. No AITFL, no CF ligament, no posterior TFL, no head of 5th metatarsal and no proximal fibula tenderness found. Achilles tendon normal.  Lymphadenopathy:    She has no cervical adenopathy.  Neurological: She is alert and oriented to person, place, and time.  Skin: Skin is warm and dry.  Psychiatric: She has a normal mood and affect.  Her behavior is normal.    ED Course  Procedures (including critical care time)  DIAGNOSTIC STUDIES: Oxygen Saturation is 98% on RA, normal by my interpretation.    COORDINATION OF CARE: 10:14 PM-Advised pt of xray results. Discussed treatment plan which includes speaking with Dr. Fayrene FearingJames and pain medication with pt at bedside and pt agreed to plan.   Labs Review Labs Reviewed - No data to display  Imaging Review Dg Ankle Complete Left  11/30/2013   CLINICAL DATA:  Twisting injury last night with pain  EXAM: LEFT ANKLE COMPLETE - 3+ VIEW  COMPARISON:  None.  FINDINGS: There is a minimally displaced fracture of the distal left fibular metaphysis with mild lateral displacement and severe overlying soft tissue swelling. There is a nondisplaced fracture at the distal tip of the medial malleolus with severe overlying soft tissue swelling. The ankle mortise is intact. There is a plantar calcaneal spur.  IMPRESSION: 1. Minimally displaced fracture of the distal left fibular metaphysis with mild lateral displacement and severe overlying soft tissue swelling. 2. Nondisplaced fracture at the distal tip of the medial malleolus with severe overlying soft tissue swelling.   Electronically Signed   By: Elige KoHetal  Patel   On: 11/30/2013 21:51     EKG Interpretation None      MDM   Final diagnoses:  Left fibular fracture, closed, initial encounter  Left tibial fracture, closed, initial encounter   Patient is a 53 y.o female who presents to the ED with left ankle pain.  Physical exam reveals left ankle swelling and tenderness to palpation.  There is no evidence of compartment syndrome.  Plain film xrays reveal minimally displaced left fibular fracture and a minimally displaced left tibial mortise fracture.  Patient was placed here in a posterior and sugar tong splint.  Patient to follow-up with ortho.  Patient to be non-weightbearing.  Patient to be discharged with 1-2 percocet q6hrs.  Patient to return for  compartment symptoms.  Patient stable for discharge.   Patient states understanding and agreement.     I personally performed the services described in this documentation, which was scribed in my presence. The recorded information has been reviewed and is accurate.  Eben Burowourtney A Forcucci, PA-C 11/30/13 [phone]

## 2013-11-30 NOTE — ED Notes (Signed)
Pt c/o burning sensation to bottom of foot and cramping in L upper thigh since returning from xray

## 2013-11-30 NOTE — Discharge Instructions (Signed)
Cast or Splint Care °Casts and splints support injured limbs and keep bones from moving while they heal.  °HOME CARE °· Keep the cast or splint uncovered during the drying period. °· A plaster cast can take 24 to 48 hours to dry. °· A fiberglass cast will dry in less than 1 hour. °· Do not rest the cast on anything harder than a pillow for 24 hours. °· Do not put weight on your injured limb. Do not put pressure on the cast. Wait for your doctor's approval. °· Keep the cast or splint dry. °· Cover the cast or splint with a plastic bag during baths or wet weather. °· If you have a cast over your chest and belly (trunk), take sponge baths until the cast is taken off. °· If your cast gets wet, dry it with a towel or blow dryer. Use the cool setting on the blow dryer. °· Keep your cast or splint clean. Wash a dirty cast with a damp cloth. °· Do not put any objects under your cast or splint. °· Do not scratch the skin under the cast with an object. If itching is a problem, use a blow dryer on a cool setting over the itchy area. °· Do not trim or cut your cast. °· Do not take out the padding from inside your cast. °· Exercise your joints near the cast as told by your doctor. °· Raise (elevate) your injured limb on 1 or 2 pillows for the first 1 to 3 days. °GET HELP IF: °· Your cast or splint cracks. °· Your cast or splint is too tight or too loose. °· You itch badly under the cast. °· Your cast gets wet or has a soft spot. °· You have a bad smell coming from the cast. °· You get an object stuck under the cast. °· Your skin around the cast becomes red or sore. °· You have new or more pain after the cast is put on. °GET HELP RIGHT AWAY IF: °· You have fluid leaking through the cast. °· You cannot move your fingers or toes. °· Your fingers or toes turn blue or white or are cool, painful, or puffy (swollen). °· You have tingling or lose feeling (numbness) around the injured area. °· You have bad pain or pressure under the  cast. °· You have trouble breathing or have shortness of breath. °· You have chest pain. °Document Released: 06/07/2010 Document Revised: 10/08/2012 Document Reviewed: 08/14/2012 °Exit[employer]® Patient Information ©2015 [employer]. This information is not intended to replace advice given to you by your health care provider. Make sure you discuss any questions you have with your health care provider. °Ankle Fracture °A fracture is a break in a bone. The ankle joint is made up of three bones. These include the lower (distal) sections of your lower leg bones, called the tibia and fibula, along with a bone in your foot, called the talus. Depending on how bad the break is and if more than one ankle joint bone is broken, a cast or splint is used to protect and keep your injured bone from moving while it heals. Sometimes, surgery is required to help the fracture heal properly.  °There are two general types of fractures: °· Stable fracture. This includes a single fracture line through one bone, with no injury to ankle ligaments. A fracture of the talus that does not have any displacement (movement of the bone on either side of the fracture line) is also stable. °· Unstable   fracture. This includes more than one fracture line through one or more bones in the ankle joint. It also includes fractures that have displacement of the bone on either side of the fracture line. °CAUSES °· A direct blow to the ankle.   °· Quickly and severely twisting your ankle. °· Trauma, such as a car accident or falling from a significant height. °RISK FACTORS °You may be at a higher risk of ankle fracture if: °· You have certain medical conditions. °· You are involved in high-impact sports. °· You are involved in a high-impact car accident. °SIGNS AND SYMPTOMS  °· Tender and swollen ankle. °· Bruising around the injured ankle. °· Pain on movement of the ankle. °· Difficulty walking or putting weight on the ankle. °· A cold foot below the site of the  ankle injury. This can occur if the blood vessels passing through your injured ankle were also damaged. °· Numbness in the foot below the site of the ankle injury. °DIAGNOSIS  °An ankle fracture is usually diagnosed with a physical exam and X-rays. A CT scan may also be required for complex fractures. °TREATMENT  °Stable fractures are treated with a cast or splint and using crutches to avoid putting weight on your injured ankle. This is followed by an ankle strengthening program. Some patients require a special type of cast, depending on other medical problems they may have. Unstable fractures require surgery to ensure the bones heal properly. Your health care provider will tell you what type of fracture you have and the best treatment for your condition. °HOME CARE INSTRUCTIONS  °· Review correct crutch use with your health care provider and use your crutches as directed. Safe use of crutches is extremely important. Misuse of crutches can cause you to fall or cause injury to nerves in your hands or armpits. °· Do not put weight or pressure on the injured ankle until directed by your health care provider. °· To lessen the swelling, keep the injured leg elevated while sitting or lying down. °· Apply ice to the injured area: °¨ Put ice in a plastic bag. °¨ Place a towel between your cast and the bag. °¨ Leave the ice on for 20 minutes, 2-3 times a day. °· If you have a plaster or fiberglass cast: °¨ Do not try to scratch the skin under the cast with any objects. This can increase your risk of skin infection. °¨ Check the skin around the cast every day. You may put lotion on any red or sore areas. °¨ Keep your cast dry and clean. °· If you have a plaster splint: °¨ Wear the splint as directed. °¨ You may loosen the elastic around the splint if your toes become numb, tingle, or turn cold or blue. °· Do not put pressure on any part of your cast or splint; it may break. Rest your cast only on a pillow the first 24 hours  until it is fully hardened. °· Your cast or splint can be protected during bathing with a plastic bag sealed to your skin with medical tape. Do not lower the cast or splint into water. °· Take medicines as directed by your health care provider. Only take over-the-counter or prescription medicines for pain, discomfort, or fever as directed by your health care provider. °· Do not drive a vehicle until your health care provider specifically tells you it is safe to do so. °· If your health care provider has given you a follow-up appointment, it is very important to   keep that appointment. Not keeping the appointment could result in a chronic or permanent injury, pain, and disability. If you have any problem keeping the appointment, call the facility for assistance. °SEEK MEDICAL CARE IF: °You develop increased swelling or discomfort. °SEEK IMMEDIATE MEDICAL CARE IF:  °· Your cast gets damaged or breaks. °· You have continued severe pain. °· You develop new pain or swelling after the cast was put on. °· Your skin or toenails below the injury turn blue or gray. °· Your skin or toenails below the injury feel cold, numb, or have loss of sensitivity to touch. °· There is a bad smell or pus draining from under the cast. °MAKE SURE YOU:  °· Understand these instructions. °· Will watch your condition. °· Will get help right away if you are not doing well or get worse. °Document Released: 02/03/2000 Document Revised: 02/10/2013 Document Reviewed: 09/04/2012 °ExitCare® Patient Information ©2015 [employer]. This information is not intended to replace advice given to you by your health care provider. Make sure you discuss any questions you have with your health care provider. ° °

## 2013-11-30 NOTE — Progress Notes (Signed)
Orthopedic Tech Progress Note Patient Details:  Angela Burch 19-Oct-1960 [id number]  Ortho Devices Type of Ortho Device: Ace wrap;Post (short leg) splint;Stirrup splint;Crutches Ortho Device/Splint Location: LLE Ortho Device/Splint Interventions: Ordered;Application   Jennye MoccasinHughes, Angela Burch 11/30/2013, 11:17 PM

## 2013-11-30 NOTE — ED Notes (Signed)
Family member stood at door to report that Pt leg pain was getting worse and pt now has numbness to leg.

## 2013-11-30 NOTE — ED Notes (Signed)
Pt is requesting Pzain meds stronger than oxycodone atg D/C.

## 2013-11-30 NOTE — ED Notes (Signed)
Patient stated she rolled her ankle last night stepping on an acorn

## 2013-12-06 NOTE — ED Provider Notes (Signed)
Medical screening examination/treatment/procedure(s) were performed by non-physician practitioner and as supervising physician I was immediately available for consultation/collaboration.   EKG Interpretation None        Afton Lavalle, MD 12/06/13 0905 

## 2015-11-20 IMAGING — CR DG ANKLE COMPLETE 3+V*L*
3 series · 3 of 3 positions shown · non-contrast
Comparison: None.

CLINICAL DATA: Twisting injury last night with pain

EXAM:
LEFT ANKLE COMPLETE - 3+ VIEW

[t ankle joint ap left]
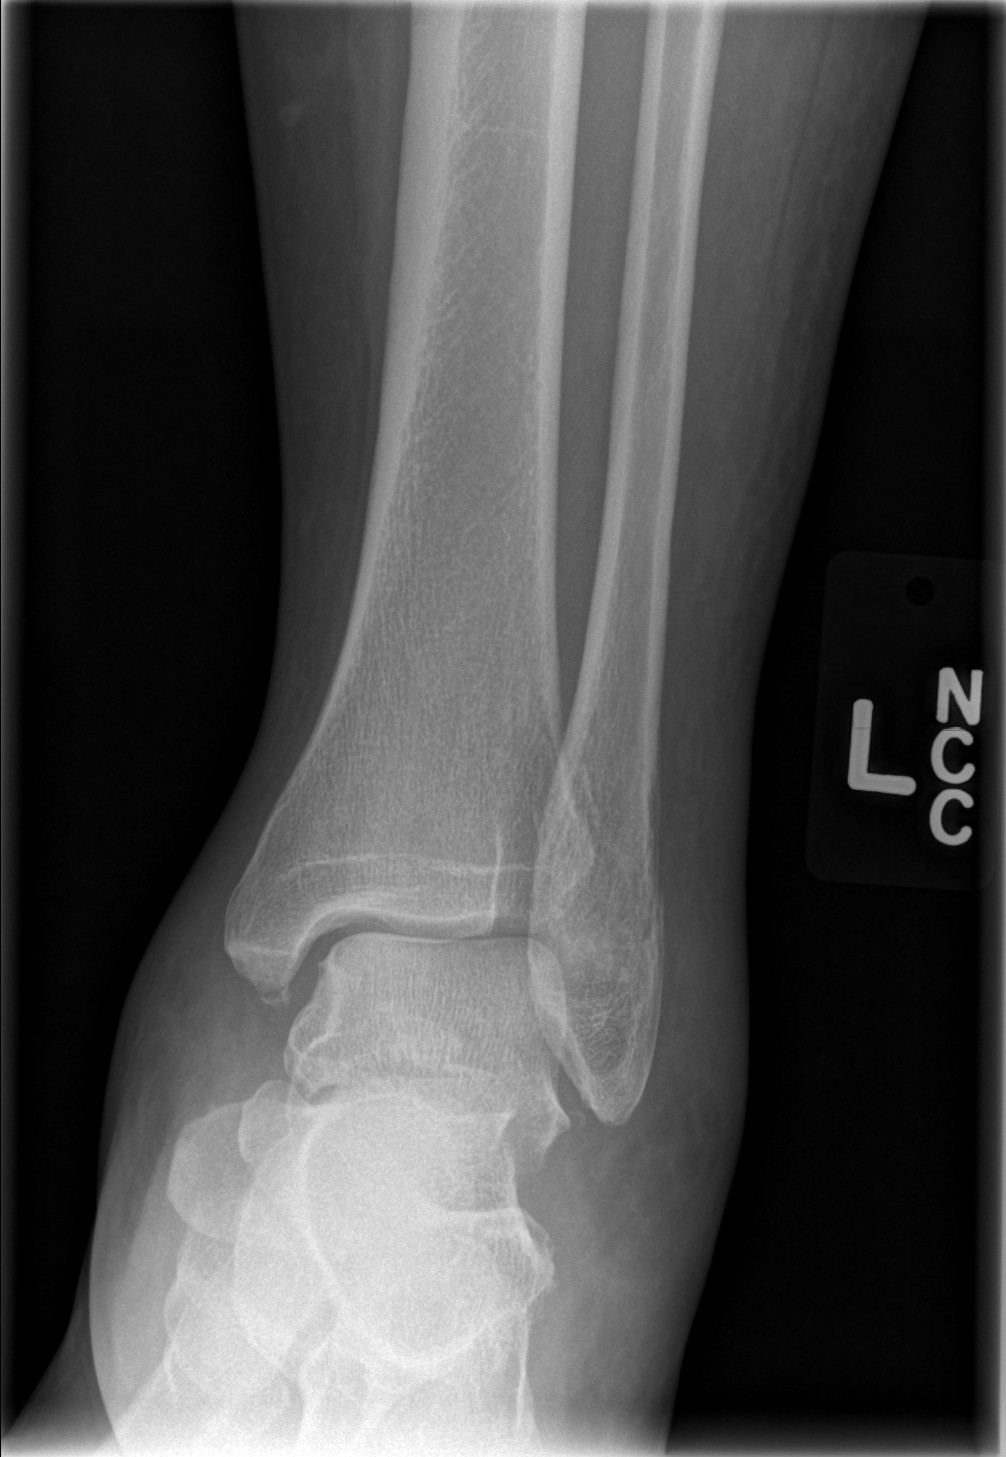

[t ankle joint oblique left]
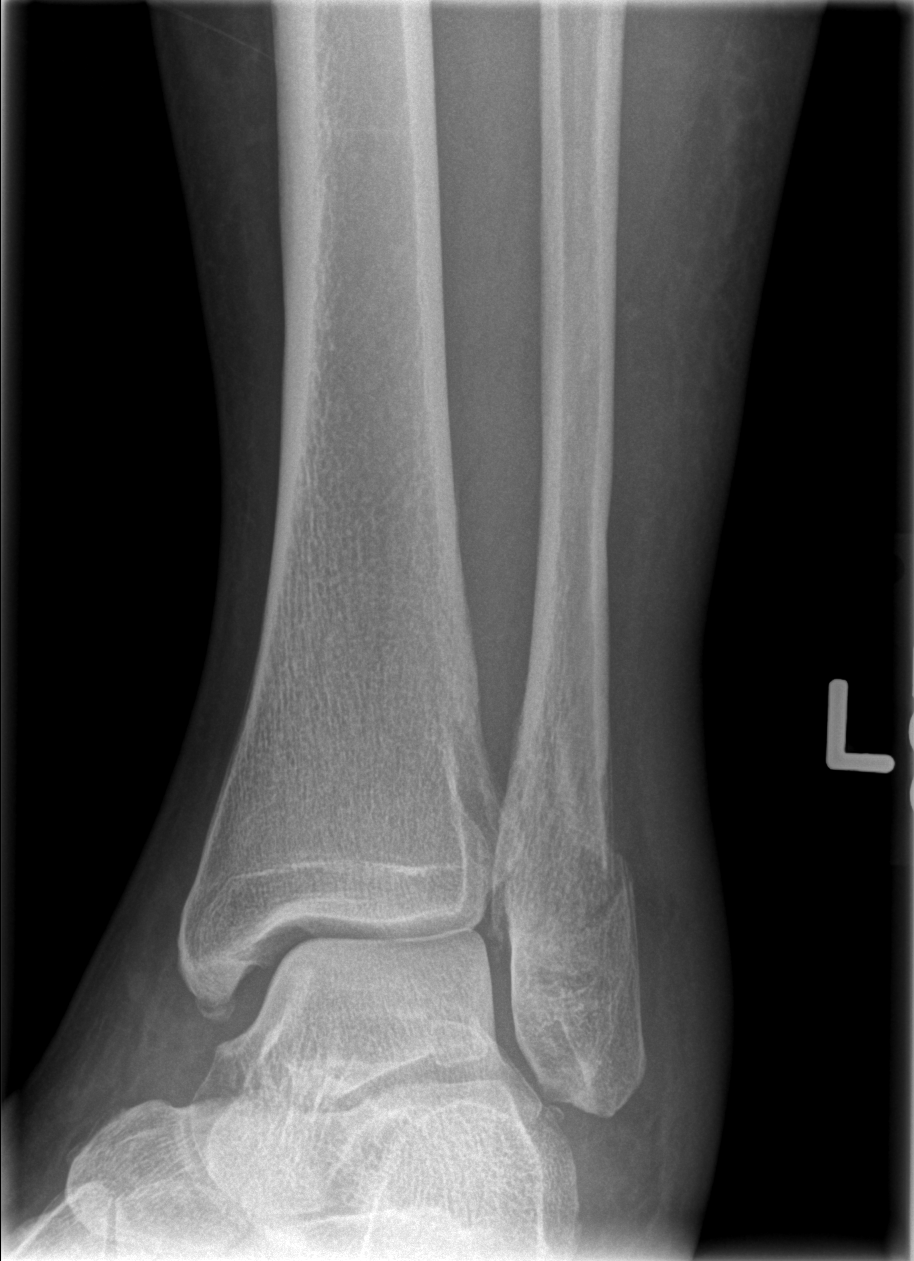

[t ankle joint lat left]
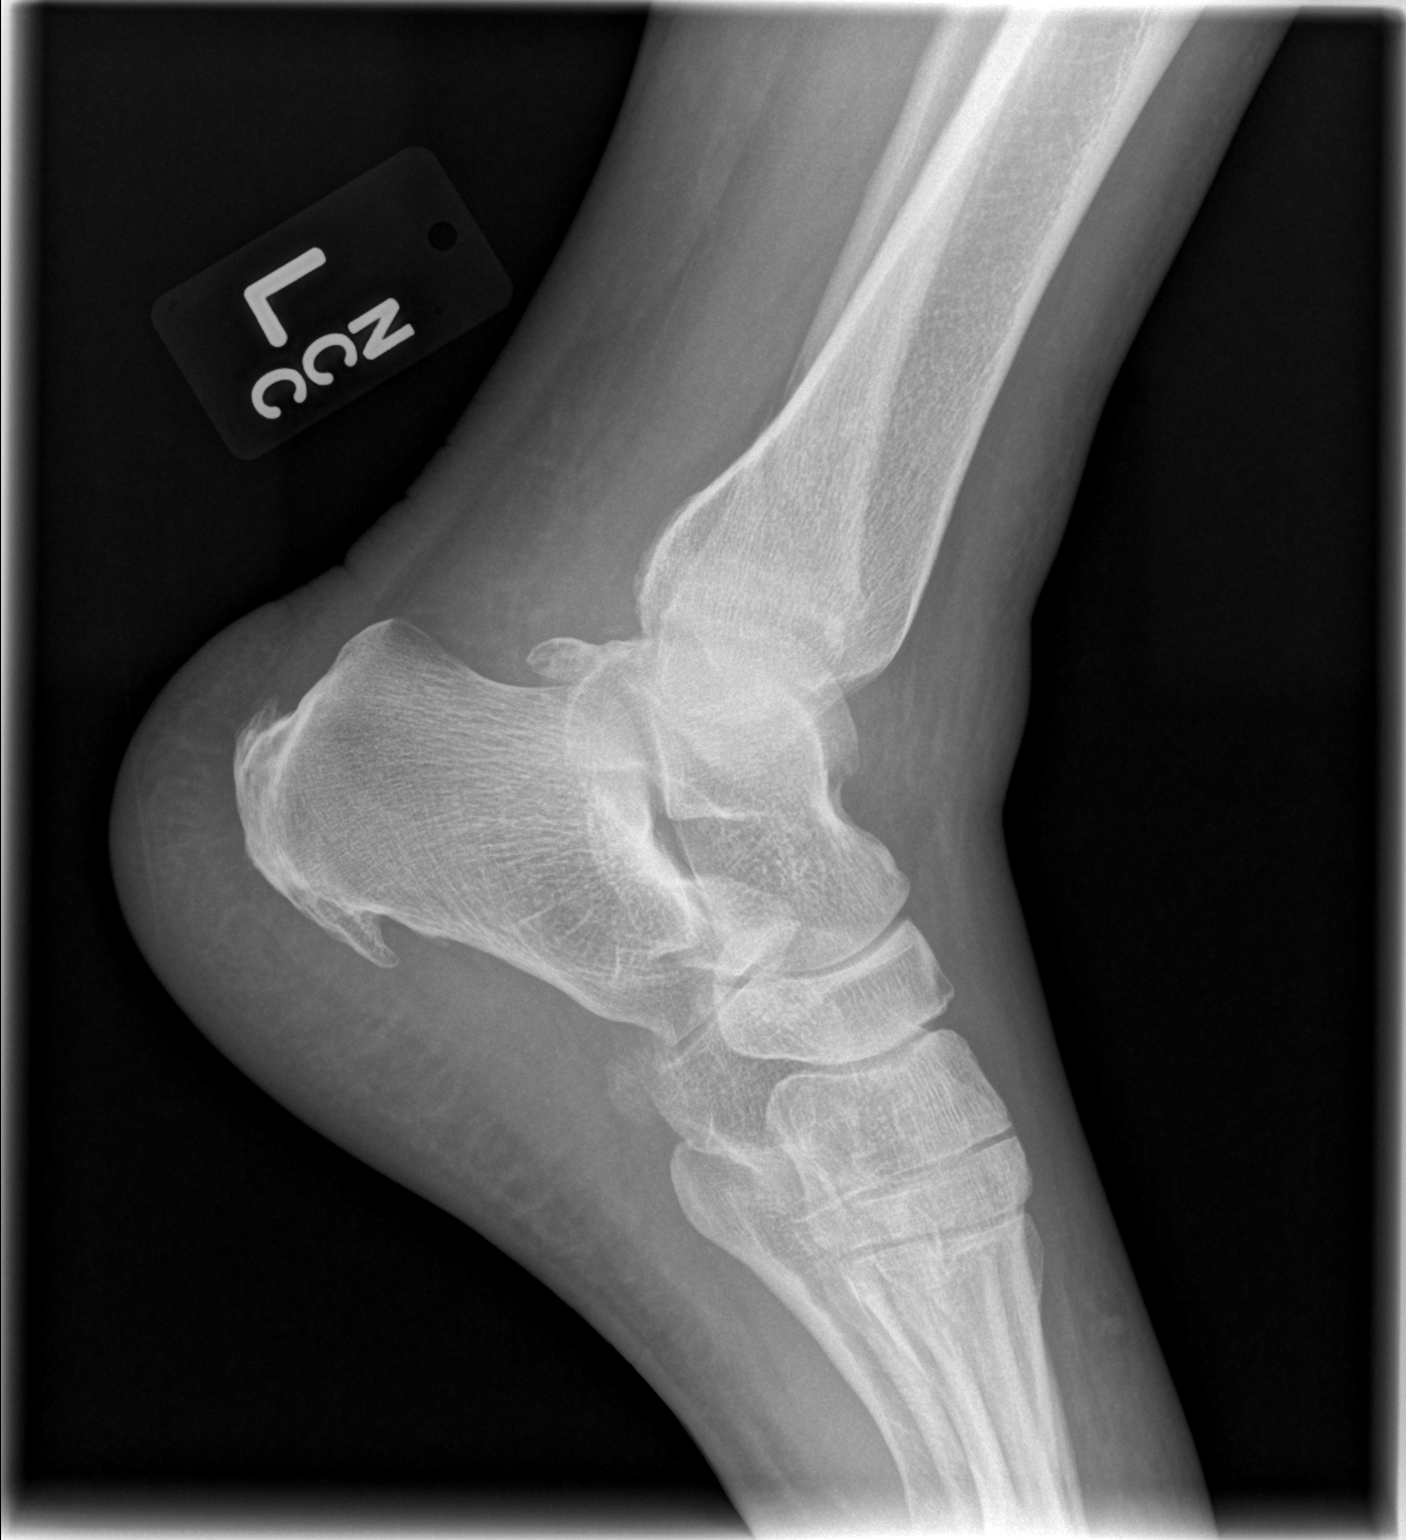

[3 of 3 positions shown; findings below may reference images not displayed]

FINDINGS: There is a minimally displaced fracture of the distal left fibular
metaphysis with mild lateral displacement and severe overlying soft
tissue swelling. There is a nondisplaced fracture at the distal tip
of the medial malleolus with severe overlying soft tissue swelling.
The ankle mortise is intact. There is a plantar calcaneal spur.
IMPRESSION: 1. Minimally displaced fracture of the distal left fibular
metaphysis with mild lateral displacement and severe overlying soft
tissue swelling.
2. Nondisplaced fracture at the distal tip of the medial malleolus
with severe overlying soft tissue swelling.

## 2016-06-10 ENCOUNTER — Encounter (HOSPITAL_COMMUNITY): Payer: Self-pay | Admitting: Physician Assistant

## 2016-06-10 ENCOUNTER — Emergency Department (HOSPITAL_COMMUNITY): Payer: Self-pay

## 2016-06-10 ENCOUNTER — Observation Stay (HOSPITAL_COMMUNITY)
Admission: EM | Admit: 2016-06-10 | Discharge: 2016-06-11 | Disposition: A | Discharge status: Home / Self Care | Payer: Self-pay | Attending: Internal Medicine | Admitting: Internal Medicine

## 2016-06-10 DIAGNOSIS — F419 Anxiety disorder, unspecified: Secondary | ICD-10-CM

## 2016-06-10 DIAGNOSIS — M25572 Pain in left ankle and joints of left foot: Secondary | ICD-10-CM | POA: Insufficient documentation

## 2016-06-10 DIAGNOSIS — I10 Essential (primary) hypertension: Secondary | ICD-10-CM

## 2016-06-10 DIAGNOSIS — E118 Type 2 diabetes mellitus with unspecified complications: Secondary | ICD-10-CM

## 2016-06-10 DIAGNOSIS — E11649 Type 2 diabetes mellitus with hypoglycemia without coma: Principal | ICD-10-CM | POA: Insufficient documentation

## 2016-06-10 DIAGNOSIS — E872 Acidosis: Secondary | ICD-10-CM | POA: Insufficient documentation

## 2016-06-10 DIAGNOSIS — T68XXXA Hypothermia, initial encounter: Secondary | ICD-10-CM | POA: Insufficient documentation

## 2016-06-10 DIAGNOSIS — Z794 Long term (current) use of insulin: Secondary | ICD-10-CM | POA: Insufficient documentation

## 2016-06-10 DIAGNOSIS — F329 Major depressive disorder, single episode, unspecified: Secondary | ICD-10-CM | POA: Insufficient documentation

## 2016-06-10 DIAGNOSIS — E119 Type 2 diabetes mellitus without complications: Secondary | ICD-10-CM

## 2016-06-10 DIAGNOSIS — X58XXXA Exposure to other specified factors, initial encounter: Secondary | ICD-10-CM | POA: Insufficient documentation

## 2016-06-10 DIAGNOSIS — E162 Hypoglycemia, unspecified: Secondary | ICD-10-CM

## 2016-06-10 DIAGNOSIS — F17200 Nicotine dependence, unspecified, uncomplicated: Secondary | ICD-10-CM | POA: Insufficient documentation

## 2016-06-10 DIAGNOSIS — I4892 Unspecified atrial flutter: Secondary | ICD-10-CM

## 2016-06-10 DIAGNOSIS — Z79899 Other long term (current) drug therapy: Secondary | ICD-10-CM | POA: Insufficient documentation

## 2016-06-10 DIAGNOSIS — M25579 Pain in unspecified ankle and joints of unspecified foot: Secondary | ICD-10-CM

## 2016-06-10 HISTORY — DX: Anxiety disorder, unspecified: F41.9

## 2016-06-10 LAB — GLUCOSE, CAPILLARY
Glucose-Capillary: 150 mg/dL — ABNORMAL HIGH (ref 65–99)
Glucose-Capillary: 159 mg/dL — ABNORMAL HIGH (ref 65–99)

## 2016-06-10 LAB — CBC WITH DIFFERENTIAL/PLATELET
BASOS ABS: 0 10*3/uL (ref 0.0–0.1)
Basophils Relative: 0 %
EOS ABS: 0 10*3/uL (ref 0.0–0.7)
Eosinophils Relative: 0 %
HCT: 44.2 % (ref 36.0–46.0)
Hemoglobin: 14.5 g/dL (ref 12.0–15.0)
LYMPHS ABS: 1.9 10*3/uL (ref 0.7–4.0)
Lymphocytes Relative: 19 %
MCH: 25.8 pg — AB (ref 26.0–34.0)
MCHC: 32.8 g/dL (ref 30.0–36.0)
MCV: 78.8 fL (ref 78.0–100.0)
MONO ABS: 0.4 10*3/uL (ref 0.1–1.0)
Monocytes Relative: 4 %
Neutro Abs: 7.9 10*3/uL — ABNORMAL HIGH (ref 1.7–7.7)
Neutrophils Relative %: 77 %
PLATELETS: 375 10*3/uL (ref 150–400)
RBC: 5.61 MIL/uL — AB (ref 3.87–5.11)
RDW: 15.8 % — AB (ref 11.5–15.5)
WBC: 10.3 10*3/uL (ref 4.0–10.5)

## 2016-06-10 LAB — COMPREHENSIVE METABOLIC PANEL
ALBUMIN: 4.4 g/dL (ref 3.5–5.0)
ALT: 27 U/L (ref 14–54)
AST: 41 U/L (ref 15–41)
Alkaline Phosphatase: 82 U/L (ref 38–126)
Anion gap: 14 (ref 5–15)
BILIRUBIN TOTAL: 0.4 mg/dL (ref 0.3–1.2)
BUN: 9 mg/dL (ref 6–20)
CO2: 19 mmol/L — ABNORMAL LOW (ref 22–32)
CREATININE: 0.49 mg/dL (ref 0.44–1.00)
Calcium: 9.1 mg/dL (ref 8.9–10.3)
Chloride: 102 mmol/L (ref 101–111)
GFR calc Af Amer: >60 mL/min (ref >60)
GLUCOSE: 97 mg/dL (ref 65–99)
Potassium: 4 mmol/L (ref 3.5–5.1)
Sodium: 135 mmol/L (ref 135–145)
TOTAL PROTEIN: 7.7 g/dL (ref 6.5–8.1)

## 2016-06-10 LAB — CBG MONITORING, ED
GLUCOSE-CAPILLARY: 86 mg/dL (ref 65–99)
GLUCOSE-CAPILLARY: 172 mg/dL — AB (ref 65–99)
Glucose-Capillary: 119 mg/dL — ABNORMAL HIGH (ref 65–99)
Glucose-Capillary: 120 mg/dL — ABNORMAL HIGH (ref 65–99)
Glucose-Capillary: 104 mg/dL — ABNORMAL HIGH (ref 65–99)
Glucose-Capillary: 72 mg/dL (ref 65–99)
Glucose-Capillary: 182 mg/dL — ABNORMAL HIGH (ref 65–99)

## 2016-06-10 LAB — CK: Total CK: 237 U/L — ABNORMAL HIGH (ref 38–234)

## 2016-06-10 LAB — URINALYSIS, ROUTINE W REFLEX MICROSCOPIC
BILIRUBIN URINE: NEGATIVE
Bacteria, UA: NONE SEEN
Glucose, UA: NEGATIVE mg/dL
Hgb urine dipstick: NEGATIVE
KETONES UR: NEGATIVE mg/dL
Leukocytes, UA: NEGATIVE
Nitrite: NEGATIVE
PROTEIN: 30 mg/dL — AB
RBC / HPF: NONE SEEN RBC/hpf (ref 0–5)
SPECIFIC GRAVITY, URINE: 1.011 (ref 1.005–1.030)
SQUAMOUS EPITHELIAL / LPF: NONE SEEN
WBC, UA: NONE SEEN WBC/hpf (ref 0–5)
pH: 6 (ref 5.0–8.0)

## 2016-06-10 LAB — I-STAT CG4 LACTIC ACID, ED
Lactic Acid, Venous: 2.94 mmol/L (ref 0.5–1.9)
Lactic Acid, Venous: 1.72 mmol/L (ref 0.5–1.9)

## 2016-06-10 LAB — LIPASE, BLOOD: LIPASE: 18 U/L (ref 11–51)

## 2016-06-10 MED ORDER — INSULIN ASPART 100 UNIT/ML ~~LOC~~ SOLN
0.0000 [IU] | Freq: Three times a day (TID) | SUBCUTANEOUS | Status: DC
  Administered 2016-06-10 – 2016-06-11 (×2): 1 [IU] via SUBCUTANEOUS

## 2016-06-10 MED ORDER — FLUTICASONE PROPIONATE 50 MCG/ACT NA SUSP
1.0000 | Freq: Every day | NASAL | Status: DC
  Administered 2016-06-11 (×2): 1 via NASAL
  Filled 2016-06-10: qty 16

## 2016-06-10 MED ORDER — KETOROLAC TROMETHAMINE 30 MG/ML IJ SOLN
60.0000 mg | Freq: Four times a day (QID) | INTRAMUSCULAR | Status: DC | PRN
  Administered 2016-06-10 – 2016-06-11 (×2): 60 mg via INTRAVENOUS
  Filled 2016-06-10 (×2): qty 2

## 2016-06-10 MED ORDER — ONDANSETRON HCL 4 MG/2ML IJ SOLN
4.0000 mg | Freq: Four times a day (QID) | INTRAMUSCULAR | Status: DC | PRN
  Administered 2016-06-10: 4 mg via INTRAVENOUS
  Filled 2016-06-10: qty 2

## 2016-06-10 MED ORDER — PAROXETINE HCL 20 MG PO TABS: 40.0000 mg | ORAL_TABLET | Freq: Every day | ORAL | Status: DC

## 2016-06-10 MED ORDER — KETOROLAC TROMETHAMINE 30 MG/ML IJ SOLN
30.0000 mg | Freq: Once | INTRAMUSCULAR | Status: AC
  Administered 2016-06-10: 30 mg via INTRAVENOUS
  Filled 2016-06-10: qty 1

## 2016-06-10 MED ORDER — CLONAZEPAM 1 MG PO TABS
1.0000 mg | ORAL_TABLET | Freq: Three times a day (TID) | ORAL | Status: DC | PRN
  Administered 2016-06-10 – 2016-06-11 (×2): 1 mg via ORAL
  Filled 2016-06-10 (×2): qty 1

## 2016-06-10 MED ORDER — ENOXAPARIN SODIUM 40 MG/0.4ML ~~LOC~~ SOLN
40.0000 mg | SUBCUTANEOUS | Status: DC
  Administered 2016-06-10: 40 mg via SUBCUTANEOUS
  Filled 2016-06-10: qty 0.4

## 2016-06-10 MED ORDER — AMPHETAMINE-DEXTROAMPHETAMINE 10 MG PO TABS
30.0000 mg | ORAL_TABLET | Freq: Two times a day (BID) | ORAL | Status: DC
Start: 2016-06-10 — End: 2016-06-11
  Administered 2016-06-10 – 2016-06-11 (×2): 30 mg via ORAL
  Filled 2016-06-10 (×2): qty 3

## 2016-06-10 MED ORDER — LISINOPRIL 10 MG PO TABS: 10.0000 mg | ORAL_TABLET | Freq: Every day | ORAL | Status: DC

## 2016-06-10 MED ORDER — ACETAMINOPHEN 650 MG RE SUPP: 650.0000 mg | Freq: Four times a day (QID) | RECTAL | Status: DC | PRN

## 2016-06-10 MED ORDER — ACETAMINOPHEN 325 MG PO TABS
650.0000 mg | ORAL_TABLET | Freq: Four times a day (QID) | ORAL | Status: DC | PRN
  Administered 2016-06-11: 650 mg via ORAL
  Filled 2016-06-10: qty 2

## 2016-06-10 MED ORDER — BISACODYL 10 MG RE SUPP: 10.0000 mg | Freq: Every day | RECTAL | Status: DC | PRN

## 2016-06-10 MED ORDER — SODIUM CHLORIDE 0.9% FLUSH
3.0000 mL | Freq: Two times a day (BID) | INTRAVENOUS | Status: DC
  Administered 2016-06-10 – 2016-06-11 (×2): 3 mL via INTRAVENOUS

## 2016-06-10 MED ORDER — ONDANSETRON HCL 4 MG PO TABS: 4.0000 mg | ORAL_TABLET | Freq: Four times a day (QID) | ORAL | Status: DC | PRN

## 2016-06-10 MED ORDER — SENNOSIDES-DOCUSATE SODIUM 8.6-50 MG PO TABS
1.0000 | ORAL_TABLET | Freq: Every evening | ORAL | Status: DC | PRN
  Filled 2016-06-10: qty 1

## 2016-06-10 MED ORDER — SERTRALINE HCL 100 MG PO TABS
100.0000 mg | ORAL_TABLET | Freq: Every day | ORAL | Status: DC
  Administered 2016-06-10 – 2016-06-11 (×2): 100 mg via ORAL
  Filled 2016-06-10 (×2): qty 1

## 2016-06-10 MED ORDER — IBUPROFEN 400 MG PO TABS
600.0000 mg | ORAL_TABLET | Freq: Once | ORAL | Status: AC
  Administered 2016-06-10: 600 mg via ORAL
  Filled 2016-06-10: qty 1

## 2016-06-10 NOTE — ED Provider Notes (Signed)
[facility]-EMERGENCY DEPT Provider Note   CSN: [id number] Arrival date & time: 06/10/16  4098     History   Chief Complaint Chief Complaint  Patient presents with  . Hypoglycemia    HPI Skyleigh ELEXIS POLLAK is a 56 y.o. female.  The history is provided by the patient and the EMS personnel. No language interpreter was used.  Hypoglycemia  Initial blood sugar:  60's Blood sugar after intervention:  Over 100 Severity:  Moderate Onset quality:  Gradual Duration:  1 day Timing:  Intermittent Progression:  Improving Chronicity:  New Diabetic status:  Controlled with insulin Current diabetic therapy:  Insulin NPH Time since last antidiabetic medication:  1 day Relieved by:  Nothing Ineffective treatments:  None tried Associated symptoms: altered mental status and weakness (generalized)   Associated symptoms: no decreased responsiveness, no shortness of breath, no sweats, no visual change and no vomiting   Altered mental status:    Severity:  Moderate   Progression:  Improving   Past Medical History:  Diagnosis Date  . Diabetes mellitus without complication   . Hypertension     There are no active problems to display for this patient.   Past Surgical History:  Procedure Laterality Date  . APPENDECTOMY    . CHOLECYSTECTOMY      OB History    No data available       Home Medications    Prior to Admission medications   Medication Sig Start Date End Date Taking? Authorizing Provider  amphetamine-dextroamphetamine (ADDERALL) 30 MG tablet Take 30 mg by mouth 2 (two) times daily.    Historical Provider, MD  buPROPion (WELLBUTRIN) 100 MG tablet Take 100 mg by mouth 2 (two) times daily.    Historical Provider, MD  clonazePAM (KLONOPIN) 1 MG tablet Take 1 mg by mouth 3 (three) times daily as needed. For anxiety.    Historical Provider, MD  insulin NPH Human (HUMULIN N,NOVOLIN N) 100 UNIT/ML injection Inject 61 Units into the skin 2 (two) times daily. 12/15/11   Trixie Dredge, PA-C   lisinopril (PRINIVIL,ZESTRIL) 10 MG tablet Take 10 mg by mouth daily.    Historical Provider, MD  oxyCODONE-acetaminophen (PERCOCET/ROXICET) 5-325 MG per tablet Take 1-2 tablets by mouth every 6 (six) hours as needed for moderate pain or severe pain. 11/30/13   Courtney Forcucci, PA-C  PARoxetine (PAXIL) 40 MG tablet Take 40 mg by mouth daily.    Historical Provider, MD    Family History No family history on file.  Social History Social History  Substance Use Topics  . Smoking status: Light Tobacco Smoker  . Smokeless tobacco: Never Used  . Alcohol use No     Allergies   Sulfa antibiotics   Review of Systems Review of Systems  Constitutional: Positive for chills and fatigue. Negative for decreased responsiveness, diaphoresis and fever.  HENT: Negative for congestion and sneezing.   Eyes: Negative for visual disturbance.  Respiratory: Negative for cough, chest tightness, shortness of breath, wheezing and stridor.   Cardiovascular: Negative for chest pain and palpitations.  Gastrointestinal: Negative for abdominal pain, diarrhea, nausea and vomiting.  Genitourinary: Negative for dysuria and flank pain.  Musculoskeletal: Negative for back pain, neck pain and neck stiffness.  Skin: Negative for rash and wound.  Neurological: Positive for weakness (generalized). Negative for light-headedness, numbness and headaches.  Psychiatric/Behavioral: Negative for agitation and confusion.  All other systems reviewed and are negative.    Physical Exam Updated Vital Signs BP 132/69   Pulse 72  Temp (!) 91.9 F (33.3 C) (Rectal) Comment: PT PLACED ON BAIR HUGGER  Resp 16   SpO2 100%   Physical Exam  Constitutional: She is oriented to person, place, and time. She appears well-developed and well-nourished. She appears distressed.  HENT:  Head: Normocephalic and atraumatic.  Mouth/Throat: Oropharynx is clear and moist. No oropharyngeal exudate.  Eyes: Conjunctivae and EOM are normal.  Pupils are equal, round, and reactive to light.  Neck: Normal range of motion.  Cardiovascular: Normal rate, normal heart sounds and intact distal pulses.   No murmur heard. Pulmonary/Chest: Effort normal and breath sounds normal. No stridor. She has no wheezes. She exhibits no tenderness.  Abdominal: Soft. Bowel sounds are normal. There is no tenderness. There is no guarding.  Musculoskeletal: She exhibits tenderness. She exhibits no deformity.  Neurological: She is alert and oriented to person, place, and time. She is not disoriented. No cranial nerve deficit or sensory deficit. She exhibits normal muscle tone. Coordination normal. GCS eye subscore is 4. GCS verbal subscore is 5. GCS motor subscore is 6.  Pt is alert and oriented on arrival. No focal neuro deficits. Pt shivering.   Skin: Capillary refill takes less than 2 seconds. No rash noted. She is not diaphoretic. No erythema.  Nursing note and vitals reviewed.    ED Treatments / Results  Labs (all labs ordered are listed, but only abnormal results are displayed) Labs Reviewed  COMPREHENSIVE METABOLIC PANEL - Abnormal; Notable for the following:       Result Value   CO2 19 (*)    All other components within normal limits  CBC WITH DIFFERENTIAL/PLATELET - Abnormal; Notable for the following:    RBC 5.61 (*)    MCH 25.8 (*)    RDW 15.8 (*)    Neutro Abs 7.9 (*)    All other components within normal limits  URINALYSIS, ROUTINE W REFLEX MICROSCOPIC - Abnormal; Notable for the following:    Protein, ur 30 (*)    All other components within normal limits  CK - Abnormal; Notable for the following:    Total CK 237 (*)    All other components within normal limits  GLUCOSE, CAPILLARY - Abnormal; Notable for the following:    Glucose-Capillary 150 (*)    All other components within normal limits  GLUCOSE, CAPILLARY - Abnormal; Notable for the following:    Glucose-Capillary 159 (*)    All other components within normal limits  CBG  MONITORING, ED - Abnormal; Notable for the following:    Glucose-Capillary 119 (*)    All other components within normal limits  I-STAT CG4 LACTIC ACID, ED - Abnormal; Notable for the following:    Lactic Acid, Venous 2.94 (*)    All other components within normal limits  CBG MONITORING, ED - Abnormal; Notable for the following:    Glucose-Capillary 120 (*)    All other components within normal limits  CBG MONITORING, ED - Abnormal; Notable for the following:    Glucose-Capillary 104 (*)    All other components within normal limits  CBG MONITORING, ED - Abnormal; Notable for the following:    Glucose-Capillary 172 (*)    All other components within normal limits  CBG MONITORING, ED - Abnormal; Notable for the following:    Glucose-Capillary 182 (*)    All other components within normal limits  CULTURE, BLOOD (ROUTINE X 2)  CULTURE, BLOOD (ROUTINE X 2)  URINE CULTURE  LIPASE, BLOOD  COMPREHENSIVE METABOLIC PANEL  CBC  HEMOGLOBIN A1C  CBG MONITORING, ED  I-STAT CG4 LACTIC ACID, ED  CBG MONITORING, ED    EKG  EKG Interpretation  Date/Time:  Sunday June 10 2016 08:24:47 EDT Ventricular Rate:  73 PR Interval:    QRS Duration: 65 QT Interval:  420 QTC Calculation: 460 R Axis:   -8 Text Interpretation:  Atrial flutter Low voltage, precordial leads Minimal ST elevation, inferior leads No prior ECG for comparison.  No STEMI Confirmed by Rush Landmark MD, CHRISTOPHER [phone]) on 06/10/2016 8:38:21 AM       Radiology Dg Chest Port 1 View  Result Date: 06/10/2016 CLINICAL DATA:  Hypothermia, confusion.  Hypoglycemia. EXAM: PORTABLE CHEST 1 VIEW COMPARISON:  10/17/2004 FINDINGS: The heart size and mediastinal contours are within normal limits. Both lungs are clear. No pleural effusion or pneumothorax. The visualized skeletal structures are unremarkable. IMPRESSION: No active disease. Electronically Signed   By: Amie Portland M.D.   On: 06/10/2016 08:46   Dg Ankle Left Port  Result  Date: 06/10/2016 CLINICAL DATA:  Patient reports fracturing her left ankle a few years ago, she reports having generalized left ankle pain on and off "depending on the weather". HX fractured left ankle EXAM: PORTABLE LEFT ANKLE - 2 VIEW COMPARISON:  11/30/2013 FINDINGS: No acute fracture. No bone lesion. Previously seen fractures have healed. The ankle mortise is normally spaced and aligned. No arthropathic change. There are plantar and dorsal calcaneal spurs stable from the prior study. Soft tissues are unremarkable. IMPRESSION: 1. No acute fracture.  No arthropathic change. 2. Heel spurs. Electronically Signed   By: Amie Portland M.D.   On: 06/10/2016 08:48    Procedures Procedures (including critical care time)  Medications Ordered in ED Medications  sertraline (ZOLOFT) tablet 100 mg (100 mg Oral Given 06/10/16 1812)  amphetamine-dextroamphetamine (ADDERALL) tablet 30 mg (not administered)  clonazePAM (KLONOPIN) tablet 1 mg (not administered)  insulin aspart (novoLOG) injection 0-9 Units (1 Units Subcutaneous Given 06/10/16 1857)  acetaminophen (TYLENOL) tablet 650 mg (not administered)    Or  acetaminophen (TYLENOL) suppository 650 mg (not administered)  senna-docusate (Senokot-S) tablet 1 tablet (not administered)  ondansetron (ZOFRAN) tablet 4 mg (not administered)    Or  ondansetron (ZOFRAN) injection 4 mg (not administered)  enoxaparin (LOVENOX) injection 40 mg (40 mg Subcutaneous Given 06/10/16 1812)  sodium chloride flush (NS) 0.9 % injection 3 mL (not administered)  bisacodyl (DULCOLAX) suppository 10 mg (not administered)  ketorolac (TORADOL) 30 MG/ML injection 60 mg (not administered)  ibuprofen (ADVIL,MOTRIN) tablet 600 mg (600 mg Oral Given 06/10/16 1206)  ketorolac (TORADOL) 30 MG/ML injection 30 mg (30 mg Intravenous Given 06/10/16 1644)     Initial Impression / Assessment and Plan / ED Course  I have reviewed the triage vital signs and the nursing notes.  Pertinent labs &  imaging results that were available during my care of the patient were reviewed by me and considered in my medical decision making (see chart for details).     Geneieve EVALYNN HANKINS is a 56 y.o. female With a past medical history significant for  insulin control diabetes, hypertension, and depression who presents with EMS for hypoglycemia and altered mental status. According to EMS, they were called out when patient yelled at neighbors saying she was confused. Patient found crawling around the floor of her house. Patient found to have a low glucose in the 60s. Patient felt that it was likely much lower than that as she reports having similar confusion with hyperglycemia. EMS provided D10  with improvement in both glucose and mental status. Patient is alert and oriented times three arrival.  On arrival, patient is complaining of chills. Patient had a rectal temperature checked and was 91. Bair hugger applied and patient was warmed.  Given cold temperature and hypoglycemia, patient had workup to look for infection. Diagnostic testing showed no evidence of infection. Patient was had a snack and subsequently had return of hypoglycemia in the 70s. Patient did not take insulin this morning. Patient then had lunch with some improvement in glucose again.  Despite reassuring lab testing, patient is very concerned about her continuing decreasing hemoglobin. Patient also concern about how lower temperature was this AM. Antibiotics were held as no infection was found.   Given her living alone, altered mental status, and continuously dropping glucose, patient admitted to hospitalist service for further evaluation and management.  Patient admitted in stable condition.    Final Clinical Impressions(s) / ED Diagnoses   Final diagnoses:  Ankle pain  Hypoglycemia  Hypothermia, initial encounter     Clinical Impression: 1. Hypoglycemia   2. Ankle pain   3. Hypothermia, initial encounter     Disposition: Admit  to Hospitalist service    Heide Scales, MD 06/10/16 2153

## 2016-06-10 NOTE — ED Notes (Signed)
Bair hugger turned down to lowest warmth setting, pt requesting to keep the blanket.

## 2016-06-10 NOTE — ED Notes (Signed)
Pt eating lunch tray  

## 2016-06-10 NOTE — ED Notes (Signed)
X-ray at bedside

## 2016-06-10 NOTE — ED Triage Notes (Signed)
Per [facility], pt from home, initally called her neighbor and said she needed help, ems arrived and had difficulty extracting patient due to pt protective animal, pt was initally confused, crawling on the floor. Ems checked CBG which was 60. Gave 12.5 g D10, 15 gram oral glucose, cbg now 148. Pt is not AAOX4, has mild superficial abrasions on face, states she fell out of bed.

## 2016-06-10 NOTE — Progress Notes (Signed)
Pt admitted to the unit at 1735. Pt mental status is A&O x 4. Pt oriented to room, staff, and call bell. Skin is intact except where otherwise charted. Full assessment charted in [facility]. Call bell within reach. Visitor guidelines reviewed w/ pt and/or family.  

## 2016-06-10 NOTE — ED Notes (Signed)
Pt ate breakfast tray.

## 2016-06-10 NOTE — ED Notes (Signed)
Breakfast tray ordered for pt

## 2016-06-10 NOTE — H&P (Signed)
History and Physical    Angela Burch ZOX:[id number] DOB: 08-11-1960 DOA: 06/10/2016   PCP: Egbert Garibaldi, NP   Patient coming from:  Home    Chief Complaint: Hypoglycemia   HPI: Angela Burch is a 56 y.o. female with medical history significant for HTN,DM, anxiety, depresion, brought by EMS due to increased confusion, crawling on the floor. EMS check CGB which was 60. Given 12.5 g of D10, 15 gm of Glucose with good response to 148. Her AMS resolved, and she is alert and able to respond to questions appropriately.  She reports this is the first time this occured. She reports compliance with her insulin dose, last taken last night.  She follows every 3 months at Urgent [facility] in [place], not recently.No head trauma. She denies any recent infections. She denies any sick contacts. No recent long distance trips. She denies any ticks. She needs low sugar foods. She denies any fevers or chills night sweats, but does feel "hot and cold", no  vision changes or mucositis. No respiratory complaints. No chest pain or palpitations. She denies any lower extremity swelling. She denies any nausea heartburn or change in her bowel habits at this time. No abdominal pain. Appetite is normal. She denies any dysuria, or gross hematuria. She denies any abnormal skin rashes, but she has some bruises on her knees, after crawling on the carpet. She reports new onset of neuropathy over the last 2 weeks. She states "it hurts all over" she denies any alcohol or recreational drug use."lillte tobacco"  ED Course:  BP 131/67   Pulse 91   Temp 98.5 F (36.9 C) (Oral)   Resp 12   SpO2 94%    glucose on admission was 120,  currently at 182 sodium 135 potassium 4.0 CO2 in CMET was 19  Anion Gap  14 initial lactic acid was 2.94, with repeat 1.72. White count 10.3 hemoglobin 14.5 platelets 375 differential in smear is unremarkable. Urinalysis negative ankle x-ray negative for fracture chest x-ray negative for  acute disease. EKG with atrial flutter, QTC's 460. No other EKG is available for comparison. CK is pending   Review of Systems: As per HPI otherwise 10 point review of systems negative.   Past Medical History:  Diagnosis Date  . Anxiety   . Diabetes mellitus without complication (HCC)   . Hypertension     Past Surgical History:  Procedure Laterality Date  . APPENDECTOMY    . CHOLECYSTECTOMY      Social History Social History   Social History  . Marital status: Legally Separated    Spouse name: N/A  . Number of children: N/A  . Years of education: N/A   Occupational History  . Not on file.   Social History Main Topics  . Smoking status: Light Tobacco Smoker  . Smokeless tobacco: Never Used  . Alcohol use No  . Drug use: No  . Sexual activity: Not on file   Other Topics Concern  . Not on file   Social History Narrative   Patient does not have medical insurance     Allergies  Allergen Reactions  . Sulfa Antibiotics Anaphylaxis and Hives    No family history on file.    Prior to Admission medications   Medication Sig Start Date End Date Taking? Authorizing Provider  amphetamine-dextroamphetamine (ADDERALL) 30 MG tablet Take 30 mg by mouth 2 (two) times daily.   Yes Historical Provider, MD  clonazePAM (KLONOPIN) 1 MG tablet Take 1 mg by  mouth 3 (three) times daily as needed. For anxiety.   Yes Historical Provider, MD  insulin NPH Human (HUMULIN N,NOVOLIN N) 100 UNIT/ML injection Inject 61 Units into the skin 2 (two) times daily. 12/15/11  Yes Trixie Dredge, PA-C  PARoxetine (PAXIL) 40 MG tablet Take 40 mg by mouth daily.   Yes Historical Provider, MD  sertraline (ZOLOFT) 100 MG tablet Take 100 mg by mouth daily.   Yes Historical Provider, MD  buPROPion (WELLBUTRIN) 100 MG tablet Take 100 mg by mouth 2 (two) times daily.    Historical Provider, MD  lisinopril (PRINIVIL,ZESTRIL) 10 MG tablet Take 10 mg by mouth daily.    Historical Provider, MD    oxyCODONE-acetaminophen (PERCOCET/ROXICET) 5-325 MG per tablet Take 1-2 tablets by mouth every 6 (six) hours as needed for moderate pain or severe pain. Patient not taking: Reported on 06/10/2016 11/30/13   Terri Piedra, PA-C    Physical Exam:  Vitals:   06/10/16 1500 06/10/16 1515 06/10/16 1530 06/10/16 1545  BP: 130/60 117/65 117/64 131/67  Pulse: 88 88 88 91  Resp: 15 16 (!) 21 12  Temp:      TempSrc:      SpO2: 100% 99% 99% 94%   Constitutional: NAD, calm, very anxious Eyes: PERRL, lids and conjunctivae normal ENMT: Mucous membranes are moist, without exudate or lesions  Neck: normal, supple, no masses, no thyromegaly Respiratory: clear to auscultation bilaterally, no wheezing, no crackles. Normal respiratory effort  Cardiovascular: Regular rate and rhythm, no murmurs, rubs or gallops. No extremity edema. 2+ pedal pulses. No carotid bruits.  Abdomen: Soft, non tender, No hepatosplenomegaly. Bowel sounds positive.  Musculoskeletal: no clubbing / cyanosis. Moves all extremities Skin: no jaundice, No lesions. Some areas of bruising in both knees  Neurologic: Sensation intact  Strength equal in all extremities Psychiatric:   Alert and oriented x 3.Anxious mood.     Labs on Admission: I have personally reviewed following labs and imaging studies  CBC:  Recent Labs Lab 06/10/16 0847  WBC 10.3  NEUTROABS 7.9*  HGB 14.5  HCT 44.2  MCV 78.8  PLT 375    Basic Metabolic Panel:  Recent Labs Lab 06/10/16 0847  NA 135  K 4.0  CL 102  CO2 19*  GLUCOSE 97  BUN 9  CREATININE 0.49  CALCIUM 9.1    GFR: CrCl cannot be calculated (Unknown ideal weight.).  Liver Function Tests:  Recent Labs Lab 06/10/16 0847  AST 41  ALT 27  ALKPHOS 82  BILITOT 0.4  PROT 7.7  ALBUMIN 4.4    Recent Labs Lab 06/10/16 0847  LIPASE 18   No results for input(s): AMMONIA in the last 168 hours.  Coagulation Profile: No results for input(s): INR, PROTIME in the last 168  hours.  Cardiac Enzymes: No results for input(s): CKTOTAL, CKMB, CKMBINDEX, TROPONINI in the last 168 hours.  BNP (last 3 results) No results for input(s): PROBNP in the last 8760 hours.  HbA1C: No results for input(s): HGBA1C in the last 72 hours.  CBG:  Recent Labs Lab 06/10/16 0905 06/10/16 0958 06/10/16 1124 06/10/16 1338 06/10/16 1428  GLUCAP 120* 104* 72 172* 182*    Lipid Profile: No results for input(s): CHOL, HDL, LDLCALC, TRIG, CHOLHDL, LDLDIRECT in the last 72 hours.  Thyroid Function Tests: No results for input(s): TSH, T4TOTAL, FREET4, T3FREE, THYROIDAB in the last 72 hours.  Anemia Panel: No results for input(s): VITAMINB12, FOLATE, FERRITIN, TIBC, IRON, RETICCTPCT in the last 72 hours.  Urine analysis:  Component Value Date/Time   COLORURINE YELLOW 06/10/2016 0919   APPEARANCEUR CLEAR 06/10/2016 0919   LABSPEC 1.011 06/10/2016 0919   PHURINE 6.0 06/10/2016 0919   GLUCOSEU NEGATIVE 06/10/2016 0919   HGBUR NEGATIVE 06/10/2016 0919   BILIRUBINUR NEGATIVE 06/10/2016 0919   KETONESUR NEGATIVE 06/10/2016 0919   PROTEINUR 30 (A) 06/10/2016 0919   UROBILINOGEN 0.2 12/15/2011 1124   NITRITE NEGATIVE 06/10/2016 0919   LEUKOCYTESUR NEGATIVE 06/10/2016 0919    Sepsis Labs: (procalcitonin:4,lacticidven:4) )No results found for this or any previous visit (from the past 240 hour(s)).   Radiological Exams on Admission: Dg Chest Port 1 View  Result Date: 06/10/2016 CLINICAL DATA:  Hypothermia, confusion.  Hypoglycemia. EXAM: PORTABLE CHEST 1 VIEW COMPARISON:  10/17/2004 FINDINGS: The heart size and mediastinal contours are within normal limits. Both lungs are clear. No pleural effusion or pneumothorax. The visualized skeletal structures are unremarkable. IMPRESSION: No active disease. Electronically Signed   By: Amie Portland M.D.   On: 06/10/2016 08:46   Dg Ankle Left Port  Result Date: 06/10/2016 CLINICAL DATA:  Patient reports fracturing her  left ankle a few years ago, she reports having generalized left ankle pain on and off "depending on the weather". HX fractured left ankle EXAM: PORTABLE LEFT ANKLE - 2 VIEW COMPARISON:  11/30/2013 FINDINGS: No acute fracture. No bone lesion. Previously seen fractures have healed. The ankle mortise is normally spaced and aligned. No arthropathic change. There are plantar and dorsal calcaneal spurs stable from the prior study. Soft tissues are unremarkable. IMPRESSION: 1. No acute fracture.  No arthropathic change. 2. Heel spurs. Electronically Signed   By: Amie Portland M.D.   On: 06/10/2016 08:48    EKG: Independently reviewed.  Assessment/Plan Active Problems:   Hypoglycemia   Anxiety   Hypertension   Diabetes (HCC)   Atrial flutter (HCC)   Hypoglycemia in a patient with DM  No recent   A1c. glucose on admission was 120  currently at 182. Anion Gap  14. initial lactic acid was 2.94, with repeat 1.72. White count 10.3  Urinalysis negative Diabetic diet Encourage PO intake CBG monitoring every 2 hours if remains hypoglycemic D5W if blood sugar drops  Atrial Flutter, this is a new finding, no other EKG to compare. Denies CP or palpitations.  QTC's 460.   Repeat EKG Will need referral for OP Cardiology evaluation once discharged  Hypertension BP 131/67   Pulse 91    Controlled Continue home anti-hypertensive medications    Anxiety and Depression and other Psych issues   Continue Clonazepam for anxiety and Zoloft, for depression . Paxil was held Continue Adderall   Pain issues . She reports "pain all over" unclear etiology ankle x-ray negative for fracture Toradol 30 mg IV q 6 prn pain   Social issues: Will request consult to Case Management as patient does not have insurance   DVT prophylaxis: Lovenox  Code Status:   Full  Family Communication:  Discussed with patient Disposition Plan: Expect patient to be discharged to home after condition improves Consults called:    None    Admission status: Medsurg  Obs    Marlowe Kays E, PA-C [facility]   06/10/2016, 4:15 PM

## 2016-06-11 DIAGNOSIS — F329 Major depressive disorder, single episode, unspecified: Secondary | ICD-10-CM

## 2016-06-11 DIAGNOSIS — E118 Type 2 diabetes mellitus with unspecified complications: Secondary | ICD-10-CM

## 2016-06-11 DIAGNOSIS — E162 Hypoglycemia, unspecified: Secondary | ICD-10-CM

## 2016-06-11 DIAGNOSIS — Z794 Long term (current) use of insulin: Secondary | ICD-10-CM

## 2016-06-11 LAB — GLUCOSE, CAPILLARY
Glucose-Capillary: 51 mg/dL — ABNORMAL LOW (ref 65–99)
Glucose-Capillary: 113 mg/dL — ABNORMAL HIGH (ref 65–99)
Glucose-Capillary: 134 mg/dL — ABNORMAL HIGH (ref 65–99)

## 2016-06-11 LAB — CBC
HEMATOCRIT: 39.3 % (ref 36.0–46.0)
HEMOGLOBIN: 12.6 g/dL (ref 12.0–15.0)
MCH: 25.5 pg — AB (ref 26.0–34.0)
MCHC: 32.1 g/dL (ref 30.0–36.0)
MCV: 79.6 fL (ref 78.0–100.0)
Platelets: 341 10*3/uL (ref 150–400)
RBC: 4.94 MIL/uL (ref 3.87–5.11)
RDW: 15.5 % (ref 11.5–15.5)
WBC: 8.5 10*3/uL (ref 4.0–10.5)

## 2016-06-11 LAB — COMPREHENSIVE METABOLIC PANEL
ALBUMIN: 3.7 g/dL (ref 3.5–5.0)
ALT: 26 U/L (ref 14–54)
ANION GAP: 9 (ref 5–15)
AST: 30 U/L (ref 15–41)
Alkaline Phosphatase: 68 U/L (ref 38–126)
BILIRUBIN TOTAL: 0.4 mg/dL (ref 0.3–1.2)
BUN: 12 mg/dL (ref 6–20)
CO2: 27 mmol/L (ref 22–32)
Calcium: 9.1 mg/dL (ref 8.9–10.3)
Chloride: 102 mmol/L (ref 101–111)
Creatinine, Ser: 0.67 mg/dL (ref 0.44–1.00)
GFR calc Af Amer: >60 mL/min (ref >60)
Glucose, Bld: 130 mg/dL — ABNORMAL HIGH (ref 65–99)
POTASSIUM: 4.4 mmol/L (ref 3.5–5.1)
Sodium: 138 mmol/L (ref 135–145)
TOTAL PROTEIN: 6.8 g/dL (ref 6.5–8.1)

## 2016-06-11 LAB — URINE CULTURE: Culture: NO GROWTH

## 2016-06-11 LAB — HEMOGLOBIN A1C
HEMOGLOBIN A1C: 8.6 % — AB (ref 4.8–5.6)
MEAN PLASMA GLUCOSE: 200 mg/dL

## 2016-06-11 MED ORDER — ALUM & MAG HYDROXIDE-SIMETH 200-200-20 MG/5ML PO SUSP
30.0000 mL | ORAL | Status: DC | PRN
  Administered 2016-06-11: 30 mL via ORAL
  Filled 2016-06-11: qty 30

## 2016-06-11 MED ORDER — WHITE PETROLATUM GEL
Status: AC
  Administered 2016-06-11: 07:00:00
  Filled 2016-06-11: qty 1

## 2016-06-11 NOTE — Progress Notes (Signed)
Inpatient Diabetes Program Recommendations  AACE/ADA: New Consensus Statement on Inpatient Glycemic Control (2015)  Target Ranges:  Prepandial:   less than 140 mg/dL      Peak postprandial:   less than 180 mg/dL (1-2 hours)      Critically ill patients:  140 - 180 mg/dL   Spoke with patient about diabetes and home regimen for diabetes control. Patient reports that she has no insurance and takes NPH usually once a day due to mild hypoglycemia  at night. Patient feels that this works better than Lantus. Discussed with patient about the option presented to her by Dr. Benjamine Mola with NPH 30 units BID instead of Current dose of 60 units Daily. Discussed glucose and A1C goals. Discussed importance of checking CBGs and maintaining good CBG control to prevent long-term and short-term complications. Explained how hyperglycemia leads to damage within blood vessels which lead to the common complications seen with uncontrolled diabetes. Discussed impact of nutrition, exercise, stress, sickness, and medications on diabetes control. Discussed carbohydrates, carbohydrate goals per day and meal, along with portion sizes. Encouraged patient to check glucose 4 times per day (before meals and at bedtime) and to keep a log book of glucose readings and insulin. Patient will need insulin adjustments. Patient verbalized understanding of information discussed and he states that he has no further questions at this time related to diabetes.   Thanks,  Christena Deem RN, MSN, [facility] Inpatient Diabetes Coordinator Team Pager [phone] (8a-5p)

## 2016-06-11 NOTE — Care Management Note (Signed)
Case Management Note  Patient Details  Name: Angela Burch MRN: [id number] Date of Birth: 12-19-1960  Subjective/Objective:   HTN, DM, anxiety                 Action/Plan: Discharge Planning: NCM spoke to pt and states she currently does not work or have insurance coverage. Provided pt with brochure for [facility]. Her appt is Jun 19, 2016 at 2:45 pm. Provided pt with MATCH. Explained she can use once per year with a $3 copay. [facility] DME rep delivered RW to room.   Expected Discharge Date:  06/11/2016                Expected Discharge Plan:  Home/Self Care  In-House Referral:  NA  Discharge planning Services  CM Consult, MATCH Program, Medication Assistance  Post Acute Care Choice:  NA Choice offered to:  NA  DME Arranged:  Walker rolling DME Agency:  [facility]  HH Arranged:  NA HH Agency:  NA  Status of Service:  Completed, signed off  If discussed at Long Length of Stay Meetings, dates discussed:    Additional Comments:  Elliot Cousin, RN 06/11/2016, 3:29 PM

## 2016-06-11 NOTE — Discharge Summary (Signed)
Physician Discharge Summary  Angela Burch GEX:[id number] DOB: 12/27/1960 DOA: 06/10/2016  PCP: Angela Garibaldi, NP  Admit date: 06/10/2016 Discharge date: 06/11/2016   Recommendations for Outpatient Follow-Up:   1. Close outpatient follow up for blood sugars   Discharge Diagnosis:   Active Problems:   Anxiety   Hypertension   Diabetes (HCC)   Hypoglycemia   Atrial flutter [facility])   Discharge disposition:  Home.  Discharge Condition: Improved.  Diet recommendation:   Carbohydrate-modified.  Wound care: None.   History of Present Illness:   56 year old female with history of hypertension, diabetes, depression brought by EMS due to confusion, hypoglycemia, hypothermia. Patient evaluated in ED - workup showed mild lactic acidosis which clear with IV fluids. Her blood sugars returned to normal, as well as her mental status. He does complain of body aches all over. BP (!) 122/52   Pulse 86   Temp 98.5 F (36.9 C) (Oral)   Resp 20   SpO2 100%  awake and alert, oriented 3. No distress. Lungs clear to auscultation. Normal S1-S2 sounds. Abdomen soft nontender. Will observe, CBGs. Likely discharge tomorrow morning.   Hospital Course by Problem:   Hypoglycemia -patient is not eating regular meal nor is she eating much due to financial constraints -patient had also titrated her NPH down -for now, holding insulin and patient will keep log of blood sugars-- once >200, will restart BID NPH  Depression -resume prior medications -have arranged for patient a PCP now that she does not have insurance   Medical Consultants:    None.   Discharge Exam:   Vitals:   06/10/16 2136 06/11/16 0517  BP: 121/63 125/61  Pulse: 89 82  Resp: 16 16  Temp: 98.1 F (36.7 C) 97.7 F (36.5 C)   Vitals:   06/10/16 1545 06/10/16 1632 06/10/16 2136 06/11/16 0517  BP: 131/67 (!) 122/52 121/63 125/61  Pulse: 91 86 89 82  Resp: Temp:   98.1 F (36.7 C) 97.7 F  (36.5 C)  TempSrc:      SpO2: 94% 100% 99% 94%    Gen:  NAD    The results of significant diagnostics from this hospitalization (including imaging, microbiology, ancillary and laboratory) are listed below for reference.     Procedures and Diagnostic Studies:   Dg Chest Port 1 View  Result Date: 06/10/2016 CLINICAL DATA:  Hypothermia, confusion.  Hypoglycemia. EXAM: PORTABLE CHEST 1 VIEW COMPARISON:  10/17/2004 FINDINGS: The heart size and mediastinal contours are within normal limits. Both lungs are clear. No pleural effusion or pneumothorax. The visualized skeletal structures are unremarkable. IMPRESSION: No active disease. Electronically Signed   By: Amie Portland M.D.   On: 06/10/2016 08:46   Dg Ankle Left Port  Result Date: 06/10/2016 CLINICAL DATA:  Patient reports fracturing her left ankle a few years ago, she reports having generalized left ankle pain on and off "depending on the weather". HX fractured left ankle EXAM: PORTABLE LEFT ANKLE - 2 VIEW COMPARISON:  11/30/2013 FINDINGS: No acute fracture. No bone lesion. Previously seen fractures have healed. The ankle mortise is normally spaced and aligned. No arthropathic change. There are plantar and dorsal calcaneal spurs stable from the prior study. Soft tissues are unremarkable. IMPRESSION: 1. No acute fracture.  No arthropathic change. 2. Heel spurs. Electronically Signed   By: Amie Portland M.D.   On: 06/10/2016 08:48     Labs:   Basic Metabolic Panel:  Recent Labs Lab 06/10/16 [phone]  06/11/16 0908  NA 135 138  K 4.0 4.4  CL 102 102  CO2 19* 27  GLUCOSE 97 130*  BUN 9 12  CREATININE 0.49 0.67  CALCIUM 9.1 9.1   GFR CrCl cannot be calculated (Unknown ideal weight.). Liver Function Tests:  Recent Labs Lab 06/10/16 0847 06/11/16 0908  AST 41 30  ALT 27 26  ALKPHOS 82 68  BILITOT 0.4 0.4  PROT 7.7 6.8  ALBUMIN 4.4 3.7    Recent Labs Lab 06/10/16 0847  LIPASE 18   No results for input(s): AMMONIA in  the last 168 hours. Coagulation profile No results for input(s): INR, PROTIME in the last 168 hours.  CBC:  Recent Labs Lab 06/10/16 0847 06/11/16 0908  WBC 10.3 8.5  NEUTROABS 7.9*  --   HGB 14.5 12.6  HCT 44.2 39.3  MCV 78.8 79.6  PLT 375 341   Cardiac Enzymes:  Recent Labs Lab 06/10/16 1904  CKTOTAL 237*   BNP: Invalid input(s): POCBNP CBG:  Recent Labs Lab 06/10/16 1824 06/10/16 2034 06/11/16 0741 06/11/16 0852 06/11/16 1115  GLUCAP 150* 159* 51* 113* 134*   D-Dimer No results for input(s): DDIMER in the last 72 hours. Hgb A1c No results for input(s): HGBA1C in the last 72 hours. Lipid Profile No results for input(s): CHOL, HDL, LDLCALC, TRIG, CHOLHDL, LDLDIRECT in the last 72 hours. Thyroid function studies No results for input(s): TSH, T4TOTAL, T3FREE, THYROIDAB in the last 72 hours.  Invalid input(s): FREET3 Anemia work up No results for input(s): VITAMINB12, FOLATE, FERRITIN, TIBC, IRON, RETICCTPCT in the last 72 hours. Microbiology Recent Results (from the past 240 hour(s))  Urine culture     Status: None   Collection Time: 06/10/16  9:19 AM  Result Value Ref Range Status   Specimen Description URINE, RANDOM  Final   Special Requests NONE  Final   Culture NO GROWTH  Final   Report Status 06/11/2016 FINAL  Final     Discharge Instructions:   Discharge Instructions    Diet - low sodium heart healthy    Complete by:  As directed    Diet Carb Modified    Complete by:  As directed    Discharge instructions    Complete by:  As directed    Keep tract of blood sugar at home-- when blood sugars > 200 resume NPH at 20 units BID and titrate up -eat regular meals with protein   Increase activity slowly    Complete by:  As directed      Allergies as of 06/11/2016      Reactions   Sulfa Antibiotics Anaphylaxis, Hives      Medication List    STOP taking these medications   buPROPion 100 MG tablet Commonly known as:  WELLBUTRIN   insulin  NPH Human 100 UNIT/ML injection Commonly known as:  HUMULIN N,NOVOLIN N   lisinopril 10 MG tablet Commonly known as:  PRINIVIL,ZESTRIL   oxyCODONE-acetaminophen 5-325 MG tablet Commonly known as:  PERCOCET/ROXICET     TAKE these medications   amphetamine-dextroamphetamine 30 MG tablet Commonly known as:  ADDERALL Take 30 mg by mouth 2 (two) times daily.   clonazePAM 1 MG tablet Commonly known as:  KLONOPIN Take 1 mg by mouth 3 (three) times daily as needed. For anxiety.   PARoxetine 40 MG tablet Commonly known as:  PAXIL Take 40 mg by mouth daily.   sertraline 100 MG tablet Commonly known as:  ZOLOFT Take 100 mg by mouth daily.  Durable Medical Equipment        Start     Ordered   06/11/16 1100  For home use only DME Walker rolling  Once    Question:  Patient needs a walker to treat with the following condition  Answer:  Sprain   06/11/16 1059        Time coordinating discharge: 25 min  Signed:  Omare Bilotta U Javontae Marlette   [facility] 06/11/2016, 1:31 PM

## 2016-06-11 NOTE — Progress Notes (Signed)
Hypoglycemic Event  CBG: 51  Treatment: Pt. Given meal tray that included 4oz orange juice  Symptoms: Asymptomatic  Follow-up CBG: Time: 8:52 CBG Result: 113  Possible Reasons for Event: Pt. Did not eat overnight  Comments/MD notified: Dr. Benjamine Mola notified    Angela Burch

## 2016-06-11 NOTE — Progress Notes (Signed)
Jazira T Battershell to be D/C'd to home per MD order.  Discussed with the patient and all questions fully answered.  VSS, Skin clean, dry and intact without evidence of skin break down, no evidence of skin tears noted. IV catheter discontinued intact. Site without signs and symptoms of complications. Dressing and pressure applied.  An After Visit Summary was printed and given to the patient. Patient received rolling walker to room.  D/c education completed with patient/family including follow up instructions, medication list, d/c activities limitations if indicated, with other d/c instructions as indicated by MD - patient able to verbalize understanding, all questions fully answered.   Patient instructed to return to ED, call 911, or call MD for any changes in condition.   Patient escorted via WC, and D/C home via private auto.  Joellyn Haff Price 06/11/2016 4:19 PM

## 2016-06-15 LAB — CULTURE, BLOOD (ROUTINE X 2)
CULTURE: NO GROWTH
Culture: NO GROWTH
SPECIAL REQUESTS: ADEQUATE
Special Requests: ADEQUATE

## 2016-06-19 ENCOUNTER — Encounter (INDEPENDENT_AMBULATORY_CARE_PROVIDER_SITE_OTHER): Payer: Self-pay | Admitting: Physician Assistant

## 2016-06-19 ENCOUNTER — Ambulatory Visit (INDEPENDENT_AMBULATORY_CARE_PROVIDER_SITE_OTHER): Payer: Self-pay | Admitting: Physician Assistant

## 2016-06-19 VITALS — BP 144/78 | HR 79 | Temp 97.9°F | Resp 18 | Ht 60.0 in | Wt 160.0 lb

## 2016-06-19 DIAGNOSIS — E119 Type 2 diabetes mellitus without complications: Secondary | ICD-10-CM

## 2016-06-19 DIAGNOSIS — Z794 Long term (current) use of insulin: Secondary | ICD-10-CM

## 2016-06-19 DIAGNOSIS — R202 Paresthesia of skin: Secondary | ICD-10-CM

## 2016-06-19 DIAGNOSIS — F411 Generalized anxiety disorder: Secondary | ICD-10-CM

## 2016-06-19 DIAGNOSIS — L301 Dyshidrosis [pompholyx]: Secondary | ICD-10-CM

## 2016-06-19 DIAGNOSIS — F988 Other specified behavioral and emotional disorders with onset usually occurring in childhood and adolescence: Secondary | ICD-10-CM

## 2016-06-19 DIAGNOSIS — I10 Essential (primary) hypertension: Secondary | ICD-10-CM

## 2016-06-19 LAB — GLUCOSE, POCT (MANUAL RESULT ENTRY): POC GLUCOSE: 300 mg/dL — AB (ref 70–99)

## 2016-06-19 MED ORDER — METFORMIN HCL ER 500 MG PO TB24: 500.0000 mg | ORAL_TABLET | Freq: Every day | ORAL | 3 refills | Status: DC

## 2016-06-19 MED ORDER — AMPHETAMINE-DEXTROAMPHETAMINE 30 MG PO TABS: 30.0000 mg | ORAL_TABLET | Freq: Two times a day (BID) | ORAL | 0 refills | Status: DC

## 2016-06-19 MED ORDER — PEN NEEDLES 30G X 8 MM MISC: 1.0000 | Freq: Every day | 5 refills | Status: DC

## 2016-06-19 MED ORDER — LISINOPRIL 10 MG PO TABS: 10.0000 mg | ORAL_TABLET | Freq: Every day | ORAL | 3 refills | Status: DC

## 2016-06-19 MED ORDER — SERTRALINE HCL 100 MG PO TABS: 100.0000 mg | ORAL_TABLET | Freq: Every day | ORAL | 1 refills | Status: DC

## 2016-06-19 MED ORDER — AMLODIPINE BESYLATE 10 MG PO TABS: 10.0000 mg | ORAL_TABLET | Freq: Every day | ORAL | 1 refills | Status: DC

## 2016-06-19 MED ORDER — BLOOD GLUCOSE MONITOR KIT: PACK | 0 refills | Status: DC

## 2016-06-19 MED ORDER — INSULIN GLARGINE 100 UNIT/ML SOLOSTAR PEN: 45.0000 [IU] | PEN_INJECTOR | Freq: Every day | SUBCUTANEOUS | 99 refills | Status: DC

## 2016-06-19 MED ORDER — CLONAZEPAM 1 MG PO TABS: 1.0000 mg | ORAL_TABLET | Freq: Two times a day (BID) | ORAL | 0 refills | Status: DC

## 2016-06-19 MED ORDER — TRIAMCINOLONE ACETONIDE 0.5 % EX CREA: 1.0000 "application " | TOPICAL_CREAM | Freq: Two times a day (BID) | CUTANEOUS | 0 refills | Status: DC

## 2016-06-19 MED FILL — !TRUE METRIX BLOOD GLUCOSE: 365 days supply | Qty: 1 | Fill #0

## 2016-06-19 MED FILL — TRIAMCINOLONE 0.5% CREAM: 0.5 | 25 days supply | Qty: 30 | Fill #0

## 2016-06-19 MED FILL — ?LISINOPRIL 10 MG TABLET: 10 | 30 days supply | Qty: 30 | Fill #0

## 2016-06-19 MED FILL — LANTUS SOLOSTAR 100 UNITS/M: 100 | 33 days supply | Qty: 15 | Fill #0

## 2016-06-19 NOTE — Patient Instructions (Signed)

## 2016-06-19 NOTE — Progress Notes (Signed)
Subjective:  Patient ID: Angela Burch, female    DOB: 07/06/1960  Age: 56 y.o. MRN: [id number]  CC: Hospital f/u  HPI Angela Burch is a 56 y.o. female with a PMH of Anxiety, ADD, HTN, and DM2 presents to establish care for DM and for medication refills. Went to the ED on 06/10/16 and found to be hypoglycemic. Pt states she was busy moving, did not eat properly, and still used her NPH insulin. Hypoglycemia one other time decades ago. Used to take metformin but stopped due to GI upset. Admits to not having proper control of her diabetes for a long time since only using NPH. Has lost her insurance and is financially limited. Lastly, would like evaluation of small pruritic bumps on the palm of her left hand. Says she has seen several providers but has not received treatment. Uses hand moisturizers with no relief. Does not endorse any other symptoms. Needs refills of all her meds before they run out.    Outpatient Medications Prior to Visit  Medication Sig Dispense Refill  . amphetamine-dextroamphetamine (ADDERALL) 30 MG tablet Take 30 mg by mouth 2 (two) times daily.    . clonazePAM (KLONOPIN) 1 MG tablet Take 1 mg by mouth 3 (three) times daily as needed. For anxiety.    Marland Kitchen PARoxetine (PAXIL) 40 MG tablet Take 40 mg by mouth daily.    . sertraline (ZOLOFT) 100 MG tablet Take 100 mg by mouth daily.     No facility-administered medications prior to visit.      ROS Review of Systems  Constitutional: Negative for chills, fever and malaise/fatigue.  Eyes: Negative for blurred vision.  Respiratory: Negative for shortness of breath.   Cardiovascular: Negative for chest pain and palpitations.  Gastrointestinal: Negative for abdominal pain and nausea.  Genitourinary: Negative for dysuria and hematuria.  Musculoskeletal: Negative for joint pain and myalgias.  Skin: Positive for itching. Negative for rash.  Neurological: Positive for tingling. Negative for headaches.  Psychiatric/Behavioral:  Negative for depression. The patient is not nervous/anxious.     Objective:  BP (!) 144/78 (BP Location: Right Arm, Patient Position: Sitting, Cuff Size: Normal)   Pulse 79   Temp 97.9 F (36.6 C) (Oral)   Resp 18   Ht 5' (1.524 m)   Wt 160 lb (72.6 kg)   SpO2 98%   BMI 31.25 kg/m   BP/Weight 06/19/2016 06/11/2016 41/28/7867  Systolic BP 672 094 709  Diastolic BP 78 61 64  Wt. (Lbs) 160 - 171  BMI 31.25 - -      Physical Exam  Constitutional: She is oriented to person, place, and time.  Well developed, overweight, NAD, polite, sarcastic, light hearted  HENT:  Head: Normocephalic and atraumatic.  Eyes: No scleral icterus.  Neck: Normal range of motion. Neck supple. No thyromegaly present.  Cardiovascular: Normal rate, regular rhythm and normal heart sounds.   Pulmonary/Chest: Effort normal and breath sounds normal.  Abdominal: Soft.  Musculoskeletal: She exhibits no edema.  Neurological: She is alert and oriented to person, place, and time.  Skin: Skin is warm and dry.  Tiny papules on mildly erythematous background with tiny areas of desquamation on left palm  Psychiatric: She has a normal mood and affect. Her behavior is normal. Thought content normal.  Vitals reviewed.    Assessment & Plan:   1. Type 2 diabetes mellitus without complication, with long-term current use of insulin (HCC) - Glucose (CBG) 300 in clinic today - Hemoglobin A1C 8.6% on 06/11/16 -  Begin metFORMIN (GLUCOPHAGE XR) 500 MG 24 hr tablet; Take 1 tablet (500 mg total) by mouth daily with breakfast.  Dispense: 30 tablet; Refill: 3 - Begin Insulin Glargine (LANTUS SOLOSTAR) 100 UNIT/ML Solostar Pen; Inject 45 Units into the skin daily at 10 pm.  Dispense: 5 pen; Refill: PRN - Insulin Pen Needle (PEN NEEDLES) 30G X 8 MM MISC; 1 each by Does not apply route at bedtime.  Dispense: 30 each; Refill: 5 - blood glucose meter kit and supplies KIT; Dispense based on patient and insurance preference. Use up to  four times daily as directed. (FOR ICD-9 250.00, 250.01).  Dispense: 1 each; Refill: 0  2. Paresthesia - Suspected diabetic neuropathy but can not fully exclude other etiologies such as CTS.  3. Dyshidrotic eczema - Begin triamcinolone cream (KENALOG) 0.5 %; Apply 1 application topically 2 (two) times daily. Do not use for more than 7 consecutive days without medical approval.  Dispense: 30 g; Refill: 0 - Use Cetaphil moisturizer  Meds ordered this encounter  Medications  . metFORMIN (GLUCOPHAGE XR) 500 MG 24 hr tablet    Sig: Take 1 tablet (500 mg total) by mouth daily with breakfast.    Dispense:  30 tablet    Refill:  3    Order Specific Question:   Supervising Provider    Answer:   Tresa Garter [id number]  . Insulin Glargine (LANTUS SOLOSTAR) 100 UNIT/ML Solostar Pen    Sig: Inject 45 Units into the skin daily at 10 pm.    Dispense:  5 pen    Refill:  PRN    Order Specific Question:   Supervising Provider    Answer:   Tresa Garter [[id number]]  . Insulin Pen Needle (PEN NEEDLES) 30G X 8 MM MISC    Sig: 1 each by Does not apply route at bedtime.    Dispense:  30 each    Refill:  5    Order Specific Question:   Supervising Provider    Answer:   Tresa Garter [id number]  . triamcinolone cream (KENALOG) 0.5 %    Sig: Apply 1 application topically 2 (two) times daily. Do not use for more than 7 consecutive days without medical approval.    Dispense:  30 g    Refill:  0    Order Specific Question:   Supervising Provider    Answer:   Tresa Garter [id number]  . blood glucose meter kit and supplies KIT    Sig: Dispense based on patient and insurance preference. Use up to four times daily as directed. (FOR ICD-9 250.00, 250.01).    Dispense:  1 each    Refill:  0    Order Specific Question:   Supervising Provider    Answer:   Tresa Garter [[id number]]    Order Specific Question:   Number of strips    Answer:   100    Order Specific Question:   Number  of lancets    Answer:   100    Follow-up: Return in about 2 weeks (around 07/03/2016).   Clent Demark PA

## 2016-06-20 MED FILL — TRUEPLUS PEN NDL 31GX5/16: 31G X 8 MM | 30 days supply | Qty: 100 | Fill #0

## 2016-06-20 MED FILL — TRUEPLUS PEN NDL 31GX5/16": 31G X 8 MM | 30 days supply | Qty: 100 | Fill #0

## 2016-06-20 MED FILL — METFORMIN HCL ER 500 MG TAB: 500 | 30 days supply | Qty: 30 | Fill #0

## 2016-06-20 MED FILL — AMLODIPINE BESYLATE 10 MG T: 10 | 30 days supply | Qty: 30 | Fill #0

## 2016-06-20 MED FILL — SERTRALINE HCL 100 MG TAB: 100 | 30 days supply | Qty: 30 | Fill #0

## 2016-06-21 ENCOUNTER — Telehealth (INDEPENDENT_AMBULATORY_CARE_PROVIDER_SITE_OTHER): Payer: Self-pay | Admitting: Physician Assistant

## 2016-06-21 ENCOUNTER — Other Ambulatory Visit (INDEPENDENT_AMBULATORY_CARE_PROVIDER_SITE_OTHER): Payer: Self-pay | Admitting: Physician Assistant

## 2016-06-21 DIAGNOSIS — E118 Type 2 diabetes mellitus with unspecified complications: Secondary | ICD-10-CM

## 2016-06-21 DIAGNOSIS — Z794 Long term (current) use of insulin: Principal | ICD-10-CM

## 2016-06-21 MED ORDER — GLUCOSE BLOOD VI STRP: ORAL_STRIP | 5 refills | Status: DC

## 2016-06-21 MED FILL — TRUE METRIX TEST STRIP: 30 days supply | Qty: 100 | Fill #0

## 2016-06-21 NOTE — Progress Notes (Signed)
Sent out request for True test strips

## 2016-06-21 NOTE — Telephone Encounter (Signed)
FWD to PCP. Tempestt S Roberts, CMA  

## 2016-06-21 NOTE — Telephone Encounter (Signed)
Take Clonopin prescription to another pharmacy. Pt is to up titrate Lantus by one unit every other day until reaching 60 units per night. Example 46 units, 46 units, 47 units, 47 units, 48 units, 48 units, and so on until reaching 60 units. Then it will be 60 units every night. I will send out test strips for [employer].

## 2016-06-21 NOTE — Telephone Encounter (Signed)
Patient was informed and expressed understanding. Maryjean Mornempestt S Roberts, CMA

## 2016-06-21 NOTE — Telephone Encounter (Signed)
Patient requesting test strips for [employer]  Requesting quantity of 200 test strips,  Per Patient [facility] pharm told her she needs a RX they  gave 20 testing strips.  clonazePAM (KLONOPIN) 1 MG  [facility], due to control medication,  Is there something else you recommend or the that [facility].  Also concerned blood sugar is over 210 or 285 not sure what is going on with sugar levels.  And also feels very tired.   Wants to know if needs to increase Lantus   Or is it that body no used to Lantus, yet.   Please follow up with patient ph: [phone]

## 2016-06-25 ENCOUNTER — Telehealth (INDEPENDENT_AMBULATORY_CARE_PROVIDER_SITE_OTHER): Payer: Self-pay | Admitting: Physician Assistant

## 2016-06-25 NOTE — Telephone Encounter (Signed)
Pt may stop metformin. Recheck glucose without metformin in the system and report back (at follow up or telephonically)

## 2016-06-25 NOTE — Telephone Encounter (Signed)
FWD to PCP. Tempestt S Roberts, CMA  

## 2016-06-25 NOTE — Telephone Encounter (Signed)
Patient called stated concerned because blood sugar at 59 or 58 or 56 in the morning. Also Metformin is causing stomach issues and making her feel really tired  Patient is using Lantus 50 units at bedtime along with metformin.  Please follow up with patient

## 2016-06-25 NOTE — Telephone Encounter (Signed)
Patient notified. Angela Burch S [name], CMA  

## 2016-07-05 ENCOUNTER — Encounter (INDEPENDENT_AMBULATORY_CARE_PROVIDER_SITE_OTHER): Payer: Self-pay | Admitting: Physician Assistant

## 2016-07-05 ENCOUNTER — Ambulatory Visit (INDEPENDENT_AMBULATORY_CARE_PROVIDER_SITE_OTHER): Payer: Self-pay | Admitting: Physician Assistant

## 2016-07-05 VITALS — BP 146/83 | HR 80 | Temp 97.7°F | Wt 160.8 lb

## 2016-07-05 DIAGNOSIS — Z23 Encounter for immunization: Secondary | ICD-10-CM

## 2016-07-05 DIAGNOSIS — Z794 Long term (current) use of insulin: Secondary | ICD-10-CM

## 2016-07-05 DIAGNOSIS — E118 Type 2 diabetes mellitus with unspecified complications: Secondary | ICD-10-CM

## 2016-07-05 DIAGNOSIS — F419 Anxiety disorder, unspecified: Secondary | ICD-10-CM

## 2016-07-05 DIAGNOSIS — I10 Essential (primary) hypertension: Secondary | ICD-10-CM

## 2016-07-05 MED ORDER — PROPRANOLOL HCL 20 MG PO TABS: 20.0000 mg | ORAL_TABLET | Freq: Two times a day (BID) | ORAL | 2 refills | Status: DC

## 2016-07-05 MED ORDER — HYDROXYZINE HCL 25 MG PO TABS: 25.0000 mg | ORAL_TABLET | Freq: Every day | ORAL | 2 refills | Status: DC

## 2016-07-05 MED FILL — ?HYDROXYZINE HCL 25 MG TAB: 25 MG | 30 days supply | Qty: 30 | Fill #0

## 2016-07-05 MED FILL — PROPRANOLOL 20 MG TABLET: 20 | 30 days supply | Qty: 60 | Fill #0

## 2016-07-05 NOTE — Patient Instructions (Addendum)
Living With Anxiety After being diagnosed with an anxiety disorder, you may be relieved to know why you have felt or behaved a certain way. It is natural to also feel overwhelmed about the treatment ahead and what it will mean for your life. With care and support, you can manage this condition and recover from it. How to cope with anxiety Dealing with stress  Stress is your body's reaction to life changes and events, both good and bad. Stress can last just a few hours or it can be ongoing. Stress can play a major role in anxiety, so it is important to learn both how to cope with stress and how to think about it differently. Talk with your health care provider or a counselor to learn more about stress reduction. He or she may suggest some stress reduction techniques, such as:  Music therapy. This can include creating or listening to music that you enjoy and that inspires you.  Mindfulness-based meditation. This involves being aware of your normal breaths, rather than trying to control your breathing. It can be done while sitting or walking.  Centering prayer. This is a kind of meditation that involves focusing on a word, phrase, or sacred image that is meaningful to you and that brings you peace.  Deep breathing. To do this, expand your stomach and inhale slowly through your nose. Hold your breath for 3-5 seconds. Then exhale slowly, allowing your stomach muscles to relax.  Self-talk. This is a skill where you identify thought patterns that lead to anxiety reactions and correct those thoughts.  Muscle relaxation. This involves tensing muscles then relaxing them. Choose a stress reduction technique that fits your lifestyle and personality. Stress reduction techniques take time and practice. Set aside 5-15 minutes a day to do them. Therapists can offer training in these techniques. The training may be covered by some insurance plans. Other things you can do to manage stress include:  Keeping a  stress diary. This can help you learn what triggers your stress and ways to control your response.  Thinking about how you respond to certain situations. You may not be able to control everything, but you can control your reaction.  Making time for activities that help you relax, and not feeling guilty about spending your time in this way. Therapy combined with coping and stress-reduction skills provides the best chance for successful treatment. Medicines  Medicines can help ease symptoms. Medicines for anxiety include:  Anti-anxiety drugs.  Antidepressants.  Beta-blockers. Medicines may be used as the main treatment for anxiety disorder, along with therapy, or if other treatments are not working. Medicines should be prescribed by a health care provider. Relationships  Relationships can play a big part in helping you recover. Try to spend more time connecting with trusted friends and family members. Consider going to couples counseling, taking family education classes, or going to family therapy. Therapy can help you and others better understand the condition. How to recognize changes in your condition Everyone has a different response to treatment for anxiety. Recovery from anxiety happens when symptoms decrease and stop interfering with your daily activities at home or work. This may mean that you will start to:  Have better concentration and focus.  Sleep better.  Be less irritable.  Have more energy.  Have improved memory. It is important to recognize when your condition is getting worse. Contact your health care provider if your symptoms interfere with home or work and you do not feel like your condition is  improving. Where to find help and support: You can get help and support from these sources:  Self-help groups.  Online and [employer].  A trusted spiritual leader.  Couples counseling.  Family education classes.  Family therapy. Follow these  instructions at home:  Eat a healthy diet that includes plenty of vegetables, fruits, whole grains, low-fat dairy products, and lean protein. Do not eat a lot of foods that are high in solid fats, added sugars, or salt.  Exercise. Most adults should do the following:  Exercise for at least 150 minutes each week. The exercise should increase your heart rate and make you sweat (moderate-intensity exercise).  Strengthening exercises at least twice a week.  Cut down on caffeine, tobacco, alcohol, and other potentially harmful substances.  Get the right amount and quality of sleep. Most adults need 7-9 hours of sleep each night.  Make choices that simplify your life.  Take over-the-counter and prescription medicines only as told by your health care provider.  Avoid caffeine, alcohol, and certain over-the-counter cold medicines. These may make you feel worse. Ask your pharmacist which medicines to avoid.  Keep all follow-up visits as told by your health care provider. This is important. Questions to ask your health care provider  Would I benefit from therapy?  How often should I follow up with a health care provider?  How long do I need to take medicine?  Are there any long-term side effects of my medicine?  Are there any alternatives to taking medicine? Contact a health care provider if:  You have a hard time staying focused or finishing daily tasks.  You spend many hours a day feeling worried about everyday life.  You become exhausted by worry.  You start to have headaches, feel tense, or have nausea.  You urinate more than normal.  You have diarrhea. Get help right away if:  You have a racing heart and shortness of breath.  You have thoughts of hurting yourself or others. If you ever feel like you may hurt yourself or others, or have thoughts about taking your own life, get help right away. You can go to your nearest emergency department or call:  Your local emergency  services (911 in the [place]  A suicide crisis helpline, such as the National Suicide Prevention Lifeline at 437-201-3790. This is open 24-hours a day. Summary  Taking steps to deal with stress can help calm you.  Medicines cannot cure anxiety disorders, but they can help ease symptoms.  Family, friends, and partners can play a big part in helping you recover from an anxiety disorder. This information is not intended to replace advice given to you by your health care provider. Make sure you discuss any questions you have with your health care provider. Document Released: 01/31/2016 Document Revised: 01/31/2016 Document Reviewed: 01/31/2016 Elsevier Interactive Patient Education  2017 [employer] Propranolol tablets What is this medicine? PROPRANOLOL (proe PRAN oh lole) is a beta-blocker. Beta-blockers reduce the workload on the heart and help it to beat more regularly. This medicine is used to treat high blood pressure, to control irregular heart rhythms (arrhythmias) and to relieve chest pain caused by angina. It may also be helpful after a heart attack. This medicine is also used to prevent migraine headaches, relieve uncontrollable shaking (tremors), and help certain problems related to the thyroid gland and adrenal gland. This medicine may be used for other purposes; ask your health care provider or pharmacist if you have questions. COMMON BRAND NAME(S): Inderal What  should I tell my health care provider before I take this medicine? They need to know if you have any of these conditions: -circulation problems or blood vessel disease -diabetes -history of heart attack or heart disease, vasospastic angina -kidney disease -liver disease -lung or breathing disease, like asthma or emphysema -pheochromocytoma -slow heart rate -thyroid disease -an unusual or allergic reaction to propranolol, other beta-blockers, medicines, foods, dyes, or preservatives -pregnant or trying to get  pregnant -breast-feeding How should I use this medicine? Take this medicine by mouth with a glass of water. Follow the directions on the prescription label. Take your doses at regular intervals. Do not take your medicine more often than directed. Do not stop taking except on your the advice of your doctor or health care professional. Talk to your pediatrician regarding the use of this medicine in children. Special care may be needed. Overdosage: If you think you have taken too much of this medicine contact a poison control center or emergency room at once. NOTE: This medicine is only for you. Do not share this medicine with others. What if I miss a dose? If you miss a dose, take it as soon as you can. If it is almost time for your next dose, take only that dose. Do not take double or extra doses. What may interact with this medicine? Do not take this medicine with any of the following medications: -feverfew -phenothiazines like chlorpromazine, mesoridazine, prochlorperazine, thioridazine This medicine may also interact with the following medications: -aluminum hydroxide gel -antipyrine -antiviral medicines for HIV or AIDS -barbiturates like phenobarbital -certain medicines for blood pressure, heart disease, irregular heart beat -cimetidine -ciprofloxacin -diazepam -fluconazole -haloperidol -isoniazid -medicines for cholesterol like cholestyramine or colestipol -medicines for mental depression -medicines for migraine headache like almotriptan, eletriptan, frovatriptan, naratriptan, rizatriptan, sumatriptan, zolmitriptan -NSAIDs, medicines for pain and inflammation, like ibuprofen or naproxen -phenytoin -rifampin -teniposide -theophylline -thyroid medicines -tolbutamide -warfarin -zileuton This list may not describe all possible interactions. Give your health care provider a list of all the medicines, herbs, non-prescription drugs, or dietary supplements you use. Also tell them if  you smoke, drink alcohol, or use illegal drugs. Some items may interact with your medicine. What should I watch for while using this medicine? Visit your doctor or health care professional for regular check ups. Check your blood pressure and pulse rate regularly. Ask your health care professional what your blood pressure and pulse rate should be, and when you should contact them. You may get drowsy or dizzy. Do not drive, use machinery, or do anything that needs mental alertness until you know how this drug affects you. Do not stand or sit up quickly, especially if you are an older patient. This reduces the risk of dizzy or fainting spells. Alcohol can make you more drowsy and dizzy. Avoid alcoholic drinks. This medicine can affect blood sugar levels. If you have diabetes, check with your doctor or health care professional before you change your diet or the dose of your diabetic medicine. Do not treat yourself for coughs, colds, or pain while you are taking this medicine without asking your doctor or health care professional for advice. Some ingredients may increase your blood pressure. What side effects may I notice from receiving this medicine? Side effects that you should report to your doctor or health care professional as soon as possible: -allergic reactions like skin rash, itching or hives, swelling of the face, lips, or tongue -breathing problems -changes in blood sugar -cold hands or feet -difficulty sleeping,  nightmares -dry peeling skin -hallucinations -muscle cramps or weakness -slow heart rate -swelling of the legs and ankles -vomiting Side effects that usually do not require medical attention (report to your doctor or health care professional if they continue or are bothersome): -change in sex drive or performance -diarrhea -dry sore eyes -hair loss -nausea -weak or tired This list may not describe all possible side effects. Call your doctor for medical advice about side  effects. You may report side effects to FDA at 1-800-FDA-1088. Where should I keep my medicine? Keep out of the reach of children. Store at room temperature between 15 and 30 degrees C (59 and 86 degrees F). Protect from light. Throw away any unused medicine after the expiration date. NOTE: This sheet is a summary. It may not cover all possible information. If you have questions about this medicine, talk to your doctor, pharmacist, or health care provider.  2018 Elsevier/Gold Standard (2012-10-10 14:51:53)

## 2016-07-05 NOTE — Progress Notes (Signed)
Subjective:  Patient ID: Angela Burch, female    DOB: August 28, 1960  Age: 56 y.o. MRN: [id number]  CC: f/u diabetes  HPI Angela Burch is a 56 y.o. female with a PMH of HTN, DM2, and anxiety. Presents on f/u of DM2. Glucometer readings presented and she is low in the mornings, 30s or 40s. High in the evening of 200s but there has been readings in the 40s in the evening also. The variations are attributed to irregular meal intake. Sometimes salads, sometimes high carb foods.     Anxiety is uncontrolled and states she only has a few left over Clonopins from long ago. She was unable to fill the Clonazepam rx prescribed at first visit due to her financial situation. Attributes her financial situation as the main trigger of her anxiety. She does not have enough money for rent or foods.         Outpatient Medications Prior to Visit  Medication Sig Dispense Refill  . amLODipine (NORVASC) 10 MG tablet Take 1 tablet (10 mg total) by mouth daily. 90 tablet 1  . amphetamine-dextroamphetamine (ADDERALL) 30 MG tablet Take 1 tablet by mouth 2 (two) times daily. 60 tablet 0  . blood glucose meter kit and supplies KIT Dispense based on patient and insurance preference. Use up to four times daily as directed. (FOR ICD-9 250.00, 250.01). 1 each 0  . glucose blood (TRUETEST TEST) test strip Use as instructed 200 each 5  . Insulin Glargine (LANTUS SOLOSTAR) 100 UNIT/ML Solostar Pen Inject 45 Units into the skin daily at 10 pm. 5 pen PRN  . Insulin Pen Needle (PEN NEEDLES) 30G X 8 MM MISC 1 each by Does not apply route at bedtime. 30 each 5  . lisinopril (PRINIVIL,ZESTRIL) 10 MG tablet Take 1 tablet (10 mg total) by mouth daily. 90 tablet 3  . sertraline (ZOLOFT) 100 MG tablet Take 1 tablet (100 mg total) by mouth daily. 90 tablet 1  . clonazePAM (KLONOPIN) 1 MG tablet Take 1 tablet (1 mg total) by mouth 2 (two) times daily at 10 AM and 5 PM. For anxiety. 60 tablet 0  . PARoxetine (PAXIL) 40 MG tablet Take 40  mg by mouth daily.    . metFORMIN (GLUCOPHAGE XR) 500 MG 24 hr tablet Take 1 tablet (500 mg total) by mouth daily with breakfast. (Patient not taking: Reported on 07/05/2016) 30 tablet 3   No facility-administered medications prior to visit.      ROS Review of Systems  Constitutional: Negative for chills, fever and malaise/fatigue.  Eyes: Negative for blurred vision.  Respiratory: Negative for shortness of breath.   Cardiovascular: Negative for chest pain and palpitations.  Gastrointestinal: Negative for abdominal pain and nausea.  Genitourinary: Negative for dysuria and hematuria.  Musculoskeletal: Negative for joint pain and myalgias.  Skin: Negative for rash.  Neurological: Negative for tingling and headaches.  Endo/Heme/Allergies:       Hypoglycemia at times.  Psychiatric/Behavioral: Negative for depression. The patient is nervous/anxious.     Objective:  BP (!) 146/83   Pulse 80   Temp 97.7 F (36.5 C) (Oral)   Wt 160 lb 12.8 oz (72.9 kg)   SpO2 99%   BMI 31.40 kg/m   BP/Weight 07/05/2016 06/19/2016 4/74/2595  Systolic BP 638 756 433  Diastolic BP 83 78 61  Wt. (Lbs) 160.8 160 -  BMI 31.4 31.25 -      Physical Exam  Constitutional: She is oriented to person, place, and time.  Well  developed, overweight, NAD, polite  HENT:  Head: Normocephalic and atraumatic.  Eyes: No scleral icterus.  Neck: Normal range of motion. Neck supple. No thyromegaly present.  Cardiovascular: Normal rate, regular rhythm and normal heart sounds.   Pulmonary/Chest: Effort normal and breath sounds normal.  Neurological: She is alert and oriented to person, place, and time.  Skin: Skin is warm and dry. No rash noted. No erythema. No pallor.  Psychiatric: She has a normal mood and affect. Her behavior is normal. Thought content normal.  Vitals reviewed.    Assessment & Plan:   1. Type 2 diabetes mellitus with complication, with long-term current use of insulin (HCC) - Take 50-55 units  of Lantus in the a.m. Instead of qhs. - Stop Metformin XR due to intolerance. - Advised to eat more consistent diet.   2. Anxiety - Begin hydrOXYzine (ATARAX/VISTARIL) 25 MG tablet; Take 1 tablet (25 mg total) by mouth at bedtime.  Dispense: 30 tablet; Refill: 2 - Begin propranolol (INDERAL) 20 MG tablet; Take 1 tablet (20 mg total) by mouth 2 (two) times daily.  Dispense: 60 tablet; Refill: 2 - TSH - CBC - CMP  3. Hypertension, unspecified type - Begin Propranolol.  - CBC with Differential - Comprehensive metabolic panel - TSH  4. Need for Tdap vaccination - Administer Tdap vaccine greater than or equal to 7yo IM   Meds ordered this encounter  Medications  . hydrOXYzine (ATARAX/VISTARIL) 25 MG tablet    Sig: Take 1 tablet (25 mg total) by mouth at bedtime.    Dispense:  30 tablet    Refill:  2    Order Specific Question:   Supervising Provider    Answer:   Tresa Garter [id number]  . propranolol (INDERAL) 20 MG tablet    Sig: Take 1 tablet (20 mg total) by mouth 2 (two) times daily.    Dispense:  60 tablet    Refill:  2    Order Specific Question:   Supervising Provider    Answer:   Tresa Garter [id number]    Follow-up: 4 weeks.   Clent Demark PA

## 2016-07-06 LAB — COMPREHENSIVE METABOLIC PANEL
ALBUMIN: 4.5 g/dL (ref 3.5–5.5)
ALK PHOS: 95 IU/L (ref 39–117)
ALT: 15 IU/L (ref 0–32)
AST: 15 IU/L (ref 0–40)
Albumin/Globulin Ratio: 1.8 (ref 1.2–2.2)
BUN / CREAT RATIO: 28 — AB (ref 9–23)
BUN: 14 mg/dL (ref 6–24)
Bilirubin Total: <0.2 mg/dL (ref 0.0–1.2)
CO2: 22 mmol/L (ref 18–29)
Calcium: 9.7 mg/dL (ref 8.7–10.2)
Chloride: 100 mmol/L (ref 96–106)
Creatinine, Ser: 0.5 mg/dL — ABNORMAL LOW (ref 0.57–1.00)
GFR calc Af Amer: 124 mL/min/{1.73_m2} (ref >59)
GFR calc non Af Amer: 108 mL/min/{1.73_m2} (ref >59)
GLUCOSE: 128 mg/dL — AB (ref 65–99)
Globulin, Total: 2.5 g/dL (ref 1.5–4.5)
Potassium: 4.7 mmol/L (ref 3.5–5.2)
Sodium: 141 mmol/L (ref 134–144)
Total Protein: 7 g/dL (ref 6.0–8.5)

## 2016-07-06 LAB — CBC WITH DIFFERENTIAL/PLATELET
BASOS ABS: 0.1 10*3/uL (ref 0.0–0.2)
Basos: 2 %
EOS (ABSOLUTE): 0.3 10*3/uL (ref 0.0–0.4)
Eos: 4 %
HEMOGLOBIN: 12.4 g/dL (ref 11.1–15.9)
Hematocrit: 37.8 % (ref 34.0–46.6)
Immature Grans (Abs): 0 10*3/uL (ref 0.0–0.1)
Immature Granulocytes: 0 %
LYMPHS ABS: 2.2 10*3/uL (ref 0.7–3.1)
LYMPHS: 28 %
MCH: 25.9 pg — ABNORMAL LOW (ref 26.6–33.0)
MCHC: 32.8 g/dL (ref 31.5–35.7)
MCV: 79 fL (ref 79–97)
MONOCYTES: 8 %
Monocytes Absolute: 0.6 10*3/uL (ref 0.1–0.9)
Neutrophils Absolute: 4.5 10*3/uL (ref 1.4–7.0)
Neutrophils: 58 %
Platelets: 380 10*3/uL — ABNORMAL HIGH (ref 150–379)
RBC: 4.79 x10E6/uL (ref 3.77–5.28)
RDW: 16.7 % — ABNORMAL HIGH (ref 12.3–15.4)
WBC: 7.8 10*3/uL (ref 3.4–10.8)

## 2016-07-06 LAB — TSH: TSH: 2.81 u[IU]/mL (ref 0.450–4.500)

## 2016-07-18 ENCOUNTER — Other Ambulatory Visit (INDEPENDENT_AMBULATORY_CARE_PROVIDER_SITE_OTHER): Payer: Self-pay | Admitting: Physician Assistant

## 2016-07-18 DIAGNOSIS — L301 Dyshidrosis [pompholyx]: Secondary | ICD-10-CM

## 2016-07-18 MED FILL — ?LISINOPRIL 10 MG TABLET: 10 | 30 days supply | Qty: 30 | Fill #1

## 2016-07-18 MED FILL — !LANTUS SOLOSTAR 100UNITS/M: 100 | 20 days supply | Qty: 9 | Fill #1

## 2016-07-18 MED FILL — TRUE METRIX TEST STRIP: 30 days supply | Qty: 100 | Fill #1

## 2016-07-18 MED FILL — SERTRALINE HCL 100 MG TAB: 100 | 30 days supply | Qty: 30 | Fill #1

## 2016-07-18 MED FILL — AMLODIPINE BESYLATE 10 MG T: 10 | 30 days supply | Qty: 30 | Fill #1

## 2016-07-19 MED FILL — TRIAMCINOLONE 0.5% CREAM: 0.5 | 7 days supply | Qty: 30 | Fill #0

## 2016-07-19 NOTE — Telephone Encounter (Signed)
FWD to PCP. Tempestt S Roberts, CMA  

## 2016-08-02 ENCOUNTER — Ambulatory Visit (INDEPENDENT_AMBULATORY_CARE_PROVIDER_SITE_OTHER): Payer: Self-pay | Admitting: Physician Assistant

## 2016-08-02 ENCOUNTER — Encounter (INDEPENDENT_AMBULATORY_CARE_PROVIDER_SITE_OTHER): Payer: Self-pay | Admitting: Physician Assistant

## 2016-08-02 VITALS — BP 142/63 | HR 64 | Temp 97.9°F | Wt 162.8 lb

## 2016-08-02 DIAGNOSIS — F32A Depression, unspecified: Secondary | ICD-10-CM

## 2016-08-02 DIAGNOSIS — F329 Major depressive disorder, single episode, unspecified: Secondary | ICD-10-CM

## 2016-08-02 DIAGNOSIS — E118 Type 2 diabetes mellitus with unspecified complications: Secondary | ICD-10-CM

## 2016-08-02 DIAGNOSIS — F419 Anxiety disorder, unspecified: Secondary | ICD-10-CM

## 2016-08-02 DIAGNOSIS — E119 Type 2 diabetes mellitus without complications: Secondary | ICD-10-CM

## 2016-08-02 DIAGNOSIS — F988 Other specified behavioral and emotional disorders with onset usually occurring in childhood and adolescence: Secondary | ICD-10-CM

## 2016-08-02 DIAGNOSIS — Z794 Long term (current) use of insulin: Secondary | ICD-10-CM

## 2016-08-02 MED ORDER — AMPHETAMINE-DEXTROAMPHETAMINE 30 MG PO TABS: 30.0000 mg | ORAL_TABLET | Freq: Every day | ORAL | 0 refills | Status: AC

## 2016-08-02 MED ORDER — INSULIN PEN NEEDLE 31G X 5 MM MISC: 1.0000 "application " | Freq: Every day | 3 refills | Status: DC

## 2016-08-02 MED ORDER — INSULIN GLARGINE 100 UNIT/ML SOLOSTAR PEN: 45.0000 [IU] | PEN_INJECTOR | Freq: Every day | SUBCUTANEOUS | 99 refills | Status: DC

## 2016-08-02 NOTE — Progress Notes (Signed)
Subjective:  Patient ID: Angela Burch, female    DOB: 06-08-1960  Age: 56 y.o. MRN: [id number]  CC: f/u anxiety and DM  HPI Angela Burch is a 56 y.o. female with a PMH of anxiety, DM2, and HTN. Reports the adjustment of Lantus from qhs to qam has been successful and is no longer having hypoglycemic episodes. Her glucometer readings are usually from 100 - 150. Admits to not keeping a strict low carb diet. She requests a smaller pen needle for her lantus.     Her anxiety and insomnia continues despite the use of propranolol and hydroxyzine. Says anxiety is a little better with propranolol in that anxiety intensity is lessened. Hydroxyzine has done nothing for insomnia. Pt reports feeling depressed despite having heard good news of starting a new job. The new job is also causing some anxiety in regards to not knowing how she will perform. She requests Adderall to help her perform better.    Outpatient Medications Prior to Visit  Medication Sig Dispense Refill  . amLODipine (NORVASC) 10 MG tablet Take 1 tablet (10 mg total) by mouth daily. 90 tablet 1  . blood glucose meter kit and supplies KIT Dispense based on patient and insurance preference. Use up to four times daily as directed. (FOR ICD-9 250.00, 250.01). 1 each 0  . glucose blood (TRUETEST TEST) test strip Use as instructed 200 each 5  . hydrOXYzine (ATARAX/VISTARIL) 25 MG tablet Take 1 tablet (25 mg total) by mouth at bedtime. 30 tablet 2  . lisinopril (PRINIVIL,ZESTRIL) 10 MG tablet Take 1 tablet (10 mg total) by mouth daily. 90 tablet 3  . propranolol (INDERAL) 20 MG tablet Take 1 tablet (20 mg total) by mouth 2 (two) times daily. 60 tablet 2  . sertraline (ZOLOFT) 100 MG tablet Take 1 tablet (100 mg total) by mouth daily. 90 tablet 1  . triamcinolone cream (KENALOG) 0.5 % APPLY 1 APPLICATION TOPICALLY 2 TIMES DAILY. DO NOT USE FOR MORE THAN 7 CONSECUTIVE DAYS WITHOUT MEDICAL APPROVAL. 30 g 0  . amphetamine-dextroamphetamine  (ADDERALL) 30 MG tablet Take 1 tablet by mouth 2 (two) times daily. 60 tablet 0  . Insulin Glargine (LANTUS SOLOSTAR) 100 UNIT/ML Solostar Pen Inject 45 Units into the skin daily at 10 pm. 5 pen PRN  . Insulin Pen Needle (PEN NEEDLES) 30G X 8 MM MISC 1 each by Does not apply route at bedtime. 30 each 5   No facility-administered medications prior to visit.      ROS Review of Systems  Constitutional: Negative for chills, fever and malaise/fatigue.  Eyes: Negative for blurred vision.  Respiratory: Negative for shortness of breath.   Cardiovascular: Negative for chest pain and palpitations.  Gastrointestinal: Negative for abdominal pain and nausea.  Genitourinary: Negative for dysuria and hematuria.  Musculoskeletal: Negative for joint pain and myalgias.  Skin: Negative for rash.  Neurological: Negative for tingling and headaches.  Psychiatric/Behavioral: Positive for depression. The patient is nervous/anxious.     Objective:  BP (!) 142/63 (BP Location: Right Arm, Patient Position: Sitting, Cuff Size: Normal)   Pulse 64   Temp 97.9 F (36.6 C) (Oral)   Wt 162 lb 12.8 oz (73.8 kg)   SpO2 99%   BMI 31.79 kg/m   BP/Weight 08/02/2016 02/25/2692 09/23/4625  Systolic BP 035 009 381  Diastolic BP 63 83 78  Wt. (Lbs) 162.8 160.8 160  BMI 31.79 31.4 31.25      Physical Exam  Constitutional: She is oriented to person,  place, and time.  Well developed, overweight, NAD, polite  HENT:  Head: Normocephalic and atraumatic.  Eyes: No scleral icterus.  Cardiovascular: Normal rate, regular rhythm and normal heart sounds.   Pulmonary/Chest: Effort normal and breath sounds normal.  Musculoskeletal: She exhibits no edema.  Neurological: She is alert and oriented to person, place, and time. No cranial nerve deficit. Coordination normal.  Skin: Skin is warm and dry. No rash noted. No erythema. No pallor.  Psychiatric: She has a normal mood and affect. Her behavior is normal. Thought content  normal.  Vitals reviewed.    Assessment & Plan:   1. Type 2 diabetes mellitus with complication, with long-term current use of insulin (HCC) - Well gontrolled based on reported glucometer readings. -Continue Lantus at 50 units in the a.m.  2. Anxiety - Continue Sertraline 132m qday - Advised to call [facility] or one of the mental health resources I gave her - I have counseled her on trying to avoid use of any controlled substances including benzos for anxiety. I feel she will benefit from psychotherapy instead of Benzodiazepines.   3. Depression, unspecified depression type - I counseled pt in regards to depression and how to make conscious decisions to choose a positive path in regards to social, physical, emotional, and spiritual aspects - Continue Sertraline 1031mqday - Advised to call [facility] or one of the mental health resources I gave her  4. Attention deficit disorder, unspecified hyperactivity presence - Decrease amphetamine-dextroamphetamine (ADDERALL) 30 MG tablet; Take 1 tablet by mouth daily.  Dispense: 30 tablet; Refill: 0 - I spoke to patient about the dependency she seems to portray for Adderall. I counseled her on having confidence in herself and not to immediately turn to a stimulant for help.   5. Type 2 diabetes mellitus without complication, with long-term current use of insulin (HCC) - Continue Insulin Glargine (LANTUS SOLOSTAR) 100 UNIT/ML Solostar Pen; Inject 45 Units into the skin daily at 10 pm.  Dispense: 5 pen; Refill: PRN - Begin Insulin Pen Needle (B-D UF III MINI PEN NEEDLES) 31G X 5 MM MISC; 1 application by Does not apply route daily.  Dispense: 90 each; Refill: 3   Meds ordered this encounter  Medications  . amphetamine-dextroamphetamine (ADDERALL) 30 MG tablet    Sig: Take 1 tablet by mouth daily.    Dispense:  30 tablet    Refill:  0    Order Specific Question:   Supervising Provider    Answer:   JETresa [id number]. Insulin  Glargine (LANTUS SOLOSTAR) 100 UNIT/ML Solostar Pen    Sig: Inject 45 Units into the skin daily at 10 pm.    Dispense:  5 pen    Refill:  PRN    Order Specific Question:   Supervising Provider    Answer:   JETresa Garter1[[id number]]. Insulin Pen Needle (B-D UF III MINI PEN NEEDLES) 31G X 5 MM MISC    Sig: 1 application by Does not apply route daily.    Dispense:  90 each    Refill:  3    Order Specific Question:   Supervising Provider    Answer:   JETresa [id number]  Follow-up: Return in about 8 weeks (around 09/27/2016).   RoClent DemarkA

## 2016-08-02 NOTE — Patient Instructions (Signed)
Generalized Anxiety Disorder, Adult Generalized anxiety disorder (GAD) is a mental health disorder. People with this condition constantly worry about everyday events. Unlike normal anxiety, worry related to GAD is not triggered by a specific event. These worries also do not fade or get better with time. GAD interferes with life functions, including relationships, work, and school. GAD can vary from mild to severe. People with severe GAD can have intense waves of anxiety with physical symptoms (panic attacks). What are the causes? The exact cause of GAD is not known. What increases the risk? This condition is more likely to develop in:  Women.  People who have a family history of anxiety disorders.  People who are very shy.  People who experience very stressful life events, such as the death of a loved one.  People who have a very stressful family environment.  What are the signs or symptoms? People with GAD often worry excessively about many things in their lives, such as their health and family. They may also be overly concerned about:  Doing well at work.  Being on time.  Natural disasters.  Friendships.  Physical symptoms of GAD include:  Fatigue.  Muscle tension or having muscle twitches.  Trembling or feeling shaky.  Being easily startled.  Feeling like your heart is pounding or racing.  Feeling out of breath or like you cannot take a deep breath.  Having trouble falling asleep or staying asleep.  Sweating.  Nausea, diarrhea, or irritable bowel syndrome (IBS).  Headaches.  Trouble concentrating or remembering facts.  Restlessness.  Irritability.  How is this diagnosed? Your health care provider can diagnose GAD based on your symptoms and medical history. You will also have a physical exam. The health care provider will ask specific questions about your symptoms, including how severe they are, when they started, and if they come and go. Your health care  provider may ask you about your use of alcohol or drugs, including prescription medicines. Your health care provider may refer you to a mental health specialist for further evaluation. Your health care provider will do a thorough examination and may perform additional tests to rule out other possible causes of your symptoms. To be diagnosed with GAD, a person must have anxiety that:  Is out of his or her control.  Affects several different aspects of his or her life, such as work and relationships.  Causes distress that makes him or her unable to take part in normal activities.  Includes at least three physical symptoms of GAD, such as restlessness, fatigue, trouble concentrating, irritability, muscle tension, or sleep problems.  Before your health care provider can confirm a diagnosis of GAD, these symptoms must be present more days than they are not, and they must last for six months or longer. How is this treated? The following therapies are usually used to treat GAD:  Medicine. Antidepressant medicine is usually prescribed for long-term daily control. Antianxiety medicines may be added in severe cases, especially when panic attacks occur.  Talk therapy (psychotherapy). Certain types of talk therapy can be helpful in treating GAD by providing support, education, and guidance. Options include: ? Cognitive behavioral therapy (CBT). People learn coping skills and techniques to ease their anxiety. They learn to identify unrealistic or negative thoughts and behaviors and to replace them with positive ones. ? Acceptance and commitment therapy (ACT). This treatment teaches people how to be mindful as a way to cope with unwanted thoughts and feelings. ? Biofeedback. This process trains you to   manage your body's response (physiological response) through breathing techniques and relaxation methods. You will work with a therapist while machines are used to monitor your physical symptoms.  Stress  management techniques. These include yoga, meditation, and exercise.  A mental health specialist can help determine which treatment is best for you. Some people see improvement with one type of therapy. However, other people require a combination of therapies. Follow these instructions at home:  Take over-the-counter and prescription medicines only as told by your health care provider.  Try to maintain a normal routine.  Try to anticipate stressful situations and allow extra time to manage them.  Practice any stress management or self-calming techniques as taught by your health care provider.  Do not punish yourself for setbacks or for not making progress.  Try to recognize your accomplishments, even if they are small.  Keep all follow-up visits as told by your health care provider. This is important. Contact a health care provider if:  Your symptoms do not get better.  Your symptoms get worse.  You have signs of depression, such as: ? A persistently sad, cranky, or irritable mood. ? Loss of enjoyment in activities that used to bring you joy. ? Change in weight or eating. ? Changes in sleeping habits. ? Avoiding friends or family members. ? Loss of energy for normal tasks. ? Feelings of guilt or worthlessness. Get help right away if:  You have serious thoughts about hurting yourself or others. If you ever feel like you may hurt yourself or others, or have thoughts about taking your own life, get help right away. You can go to your nearest emergency department or call:  Your local emergency services (911 in the [place]  A suicide crisis helpline, such as the National Suicide Prevention Lifeline at 575-082-2798. This is open 24 hours a day.  Summary  Generalized anxiety disorder (GAD) is a mental health disorder that involves worry that is not triggered by a specific event.  People with GAD often worry excessively about many things in their lives, such as their health and  family.  GAD may cause physical symptoms such as restlessness, trouble concentrating, sleep problems, frequent sweating, nausea, diarrhea, headaches, and trembling or muscle twitching.  A mental health specialist can help determine which treatment is best for you. Some people see improvement with one type of therapy. However, other people require a combination of therapies. This information is not intended to replace advice given to you by your health care provider. Make sure you discuss any questions you have with your health care provider. Document Released: 06/02/2012 Document Revised: 12/27/2015 Document Reviewed: 12/27/2015 Elsevier Interactive Patient Education  2018 [employer].    [facility]  Advocacy/Legal Legal Aid [place]:  [phone]  /  [phone]  Family Justice Center:  [phone]  Family Service of the [facility] 24-hr Crisis line:  [phone]  [facility], [place]:  [phone]  Court Watch (custody):  [phone]  [employer] Law Clinic:   [phone]    Baby & Breastfeeding Car Seat Inspection @ Various [place] Equities trader.- call [phone]  [facility] Lactation  [phone]  [facility] Lactation [phone]  WIC: [phone] ([place]);  [phone] ([place])  [place] League:  [phone]   Childcare [place] Child Development: [phone] [facility]) / [phone] ([place])  - Child Care Resources/ Referrals/ Scholarships  - Head Start/ Early Head Start (call or apply online)  [place] DHHS: [place] Pre-K :  [phone] / [phone]   Employment / Job Airline pilot  [place]: [phone] / [address]  [place] [employer] ([employer]Link): [phone] ([place]) / [phone] ([place])  [employer] laboratory technicianGoodwill Community Resource/ Career Center: [phone] / [phone]  [facility] Job & Career Center: [phone]  DHHS Work First: [phone] ([place]) / [phone] ([place])  [facility] [place]:  [phone]     Financial Assistance [place] [facility]:  [phone]  [employer]: (747) 848-9290([phone]  [employer]omin[employer] SeverinBarnabas Network (furniture):  ([phone]  [facility] Helping Hands: [phone]  Low Income Energy Assistance  [phone] [phone]   Food Assistance DHHS- SNAP/ Food Stamps: [phone]  WIC: Manley [phone]- [phone] ;  [place] [phone]  Layne BentonLittle Green Book- Free Meals  Little Blue Book- Free Food Pantries  During the summer, text "FOOD" to [phone]   General Health / Clinics (Adults) Orange Card (for Adults) through [facility]: [phone]  [facility]:   [phone]  [facility] Community Health & Wellness:   [phone]  Health Department:  [phone]  [facility]:  [phone] / [phone]  [facility]anned [facility] of [place]:   [phone]  [facility]:   [phone]   Housing [place] Housing Coalition:   ([phone]  [facility]:  (939) [phone]  Affordable Housing Managemnt:  [phone]   Immigrant/ Refugee Center for [facility] [place] [place]([employer]):  [phone]  Faith [employer] House:  [phone]  [employer] Institute:  [phone]  Parks RangerChurch World Services:  [phone]  African Services Coalition:  [phone]   LGBTQ YouthSAFE  www.youthsafegso.org  PFLAG  654-650-3546870-723-4241 / info@[place].org  The Trevor Project:  (351)[phone]   Mental Health/ Substance Use Family Service of the [facility]  [phone]  [facility]:  [phone] or [phone]  [facility] of Care:  [phone]  [facility]:  [phone]  [facility] Care Services:  [phone]  [facility] (walk-ins)  [phone] / [address]  [place]:  [phone]  Alcoholics Anonymous:  [phone]  Narcotics Anonymous:  [phone]  Quit Smoking Hotline:  800-QUIT-NOW (339)623-8656(934 136 7917)   Parenting Children's Home Society:  [phone]  [facility]: Education Center & Support Groups:   [phone]  YWCA: [phone]  [employer]: Bringing Out the Best:  [phone]               Thriving at Three (Hispanic families): [phone]  Healthy Start (Family Service of the [place]):  [phone]  Parents as Teachers:  [phone]  [place] Child Development- Learning Together (Immigrants): [phone]   Poison Control [phone]  Sports & Recreation [employer] Open Doors Application: https://www.rich.com/ymcanwnc.org/join/open-doors-financial-assistance/  [place] of [place] Recreation Centers: http://www.[place].gov/index.aspx?page=3615   Special Needs Family Support Network:  [phone]  Autism Society of [place]:   [phone] or [phone] /  [phone]  [facility] [place]:  [phone]  ARC of [place]:  ([phone]  Children's Developmental Service Agency (CDSA):  [phone]  [facility] (Care Coordination for Children):  [phone]   Transportation Medicaid Transportation: [phone] to apply  [place] Transit Authority: [phone] (reduced-fare bus ID to Medicaid/ Medicare/ Orange Card)  SCAT Paratransit services: Eligible riders only, call [phone] for application   Tutoring/Mentoring Black Child Development Institute: [phone]  [facility]/ Big Sisters: [phone]([place])  [phone] ([place])  ACES through child's school: [phone]  [employer] Achievers: contact your local Y  SHIELD Mentor Program: [phone]

## 2016-08-13 ENCOUNTER — Encounter (INDEPENDENT_AMBULATORY_CARE_PROVIDER_SITE_OTHER): Payer: Self-pay | Admitting: Physician Assistant

## 2016-08-13 ENCOUNTER — Ambulatory Visit (INDEPENDENT_AMBULATORY_CARE_PROVIDER_SITE_OTHER): Payer: Self-pay | Admitting: Physician Assistant

## 2016-08-13 VITALS — BP 168/78 | HR 74 | Temp 97.9°F | Wt 163.4 lb

## 2016-08-13 DIAGNOSIS — Z760 Encounter for issue of repeat prescription: Secondary | ICD-10-CM

## 2016-08-13 DIAGNOSIS — H1033 Unspecified acute conjunctivitis, bilateral: Secondary | ICD-10-CM

## 2016-08-13 MED ORDER — TOBRAMYCIN 0.3 % OP SOLN: 2.0000 [drp] | OPHTHALMIC | 0 refills | Status: DC

## 2016-08-13 MED ORDER — INSULIN PEN NEEDLE 31G X 5 MM MISC: 1.0000 "application " | Freq: Every day | 3 refills | Status: DC

## 2016-08-13 MED ORDER — INSULIN GLARGINE 100 UNIT/ML SOLOSTAR PEN: 45.0000 [IU] | PEN_INJECTOR | Freq: Every day | SUBCUTANEOUS | 99 refills | Status: DC

## 2016-08-13 MED ORDER — HYDROXYZINE HCL 25 MG PO TABS: 25.0000 mg | ORAL_TABLET | Freq: Every day | ORAL | 2 refills | Status: DC

## 2016-08-13 MED ORDER — PROPRANOLOL HCL 20 MG PO TABS
20.0000 mg | ORAL_TABLET | Freq: Two times a day (BID) | ORAL | 2 refills | Status: DC
Start: 2016-08-13 — End: 2023-01-06

## 2016-08-13 MED ORDER — TOBRAMYCIN 0.3 % OP SOLN: 2.0000 [drp] | OPHTHALMIC | 0 refills | Status: AC

## 2016-08-13 MED FILL — PROPRANOLOL 20 MG TABLET: 20 | 30 days supply | Qty: 60 | Fill #0

## 2016-08-13 MED FILL — ?HYDROXYZINE HCL 25 MG TAB: 25 MG | 30 days supply | Qty: 30 | Fill #0

## 2016-08-13 MED FILL — TRUEPLUS PEN NDL 31GX3/16": 31G X 5 MM | 25 days supply | Qty: 100 | Fill #0

## 2016-08-13 MED FILL — TOBRAMYCIN 0.3% EYE DROPS: 0.3 | 4 days supply | Qty: 5 | Fill #0

## 2016-08-13 MED FILL — !LANTUS SOLOSTAR 100UNITS/M: 100 | 6 days supply | Qty: 3 | Fill #0

## 2016-08-13 MED FILL — TRUEPLUS PEN NDL 31GX3/16: 31G X 5 MM | 25 days supply | Qty: 100 | Fill #0

## 2016-08-13 NOTE — Progress Notes (Signed)
Subjective:  Patient ID: Angela Burch, female    DOB: [birth year]  Age: 56 y.o. MRN: [id number]  CC: conjunctivitis  HPI Angela Burch is a 56 y.o. female with a PMH of Anxiety, DM2, and HTN presents with bilateral conjunctivitis. Two day onset of itching and pain R > L. Right upper eyelid is swollen. Eyelid have been stuck together and has been crusting in the morning. Has applied warm compresses with little to no relief. Does not endorse any other symptoms. Requests medications prescribed at previous encounter to be sent to [facility] pharmacy instead of [facility].     ROS Review of Systems  Constitutional: Negative for chills, fever and malaise/fatigue.  Eyes: Negative for blurred vision.  Respiratory: Negative for shortness of breath.   Cardiovascular: Negative for chest pain and palpitations.  Gastrointestinal: Negative for abdominal pain and nausea.  Genitourinary: Negative for dysuria and hematuria.  Musculoskeletal: Negative for joint pain and myalgias.  Skin: Positive for itching. Negative for rash.  Neurological: Negative for tingling and headaches.  Psychiatric/Behavioral: Negative for depression. The patient is not nervous/anxious.     Objective:  BP (!) 168/78 (BP Location: Left Arm, Patient Position: Sitting, Cuff Size: Normal)   Pulse 74   Temp 97.9 F (36.6 C) (Oral)   Wt 163 lb 6.4 oz (74.1 kg)   SpO2 99%   BMI 31.91 kg/m   BP/Weight 08/13/2016 08/02/2016 07/05/2016  Systolic BP 168 142 146  Diastolic BP 78 63 83  Wt. (Lbs) 163.4 162.8 160.8  BMI 31.91 31.79 31.4      Physical Exam  Constitutional: She is oriented to person, place, and time.  Well developed, overweight, NAD, polite  HENT:  Head: Normocephalic and atraumatic.  Eyes: Conjunctivae are normal. No scleral icterus.  Right upper eyelid with moderate edema and mild erythema. Right lower eyelid and left eyelids with very mild edema and erythema. No watering or crusting seen.   Neurological: She is alert  and oriented to person, place, and time.  Skin: Skin is warm and dry. No rash noted. No erythema. No pallor.  Psychiatric: She has a normal mood and affect. Her behavior is normal. Thought content normal.  Vitals reviewed.    Assessment & Plan:   1. Acute conjunctivitis of both eyes, unspecified acute conjunctivitis type - Begin tobramycin (TOBREX) 0.3 % ophthalmic solution; Place 2 drops into both eyes every 4 (four) hours.  Dispense: 5 mL; Refill: 0  2. Medication refill - Insulin Glargine (LANTUS SOLOSTAR) 100 UNIT/ML Solostar Pen; Inject 45 Units into the skin daily at 10 pm.  Dispense: 5 pen; Refill: PRN - Insulin Pen Needle (B-D UF III MINI PEN NEEDLES) 31G X 5 MM MISC; 1 application by Does not apply route daily.  Dispense: 90 each; Refill: 3 - hydrOXYzine (ATARAX/VISTARIL) 25 MG tablet; Take 1 tablet (25 mg total) by mouth at bedtime.  Dispense: 30 tablet; Refill: 2 - propranolol (INDERAL) 20 MG tablet; Take 1 tablet (20 mg total) by mouth 2 (two) times daily.  Dispense: 60 tablet; Refill: 2      Meds ordered this encounter  Medications  . DISCONTD: tobramycin (TOBREX) 0.3 % ophthalmic solution    Sig: Place 2 drops into both eyes every 4 (four) hours.    Dispense:  5 mL    Refill:  0    Order Specific Question:   Supervising Provider    Answer:   Quentin Angst [id number]  . DISCONTD: Insulin Pen Needle (B-D UF III MINI  PEN NEEDLES) 31G X 5 MM MISC    Sig: 1 application by Does not apply route daily.    Dispense:  90 each    Refill:  3    Order Specific Question:   Supervising Provider    Answer:   Quentin AngstJEGEDE, OLUGBEMIGA E [id number]]  . Insulin Glargine (LANTUS SOLOSTAR) 100 UNIT/ML Solostar Pen    Sig: Inject 45 Units into the skin daily at 10 pm.    Dispense:  5 pen    Refill:  PRN    Order Specific Question:   Supervising Provider    Answer:   Quentin AngstJEGEDE, OLUGBEMIGA E [[id number]]  . hydrOXYzine (ATARAX/VISTARIL) 25 MG tablet    Sig: Take 1 tablet (25 mg total) by  mouth at bedtime.    Dispense:  30 tablet    Refill:  2    Order Specific Question:   Supervising Provider    Answer:   Quentin AngstJEGEDE, OLUGBEMIGA E [id number]]  . propranolol (INDERAL) 20 MG tablet    Sig: Take 1 tablet (20 mg total) by mouth 2 (two) times daily.    Dispense:  60 tablet    Refill:  2    Order Specific Question:   Supervising Provider    Answer:   Quentin AngstJEGEDE, OLUGBEMIGA E [id number]]  . tobramycin (TOBREX) 0.3 % ophthalmic solution    Sig: Place 2 drops into both eyes every 4 (four) hours.    Dispense:  5 mL    Refill:  0    Order Specific Question:   Supervising Provider    Answer:   Quentin AngstJEGEDE, OLUGBEMIGA E [id number]]  . Insulin Pen Needle (B-D UF III MINI PEN NEEDLES) 31G X 5 MM MISC    Sig: 1 application by Does not apply route daily.    Dispense:  90 each    Refill:  3    Order Specific Question:   Supervising Provider    Answer:   Quentin AngstJEGEDE, OLUGBEMIGA E [id number]]    Follow-up: Return if symptoms worsen or fail to improve.   Loletta Specteroger David Sabien Umland PA

## 2016-08-13 NOTE — Patient Instructions (Signed)

## 2016-08-27 ENCOUNTER — Other Ambulatory Visit: Payer: Self-pay | Admitting: *Deleted

## 2016-08-27 DIAGNOSIS — Z760 Encounter for issue of repeat prescription: Secondary | ICD-10-CM

## 2016-08-27 MED ORDER — INSULIN GLARGINE 100 UNIT/ML SOLOSTAR PEN: 45.0000 [IU] | PEN_INJECTOR | Freq: Every day | SUBCUTANEOUS | 99 refills | Status: DC

## 2016-08-27 NOTE — Telephone Encounter (Signed)
PRINTED FOR PASS PROGRAM 

## 2016-10-03 ENCOUNTER — Ambulatory Visit (INDEPENDENT_AMBULATORY_CARE_PROVIDER_SITE_OTHER): Payer: Self-pay | Admitting: Physician Assistant

## 2017-06-14 ENCOUNTER — Other Ambulatory Visit: Payer: Self-pay

## 2017-06-14 ENCOUNTER — Encounter (HOSPITAL_COMMUNITY): Payer: Self-pay | Admitting: *Deleted

## 2017-06-14 ENCOUNTER — Emergency Department (HOSPITAL_COMMUNITY)
Admission: EM | Admit: 2017-06-14 | Discharge: 2017-06-14 | Disposition: A | Discharge status: Home / Self Care | Payer: BLUE CROSS/BLUE SHIELD | Attending: Emergency Medicine | Admitting: Emergency Medicine

## 2017-06-14 DIAGNOSIS — R1012 Left upper quadrant pain: Secondary | ICD-10-CM | POA: Diagnosis not present

## 2017-06-14 DIAGNOSIS — Z5321 Procedure and treatment not carried out due to patient leaving prior to being seen by health care provider: Secondary | ICD-10-CM | POA: Insufficient documentation

## 2017-06-14 LAB — COMPREHENSIVE METABOLIC PANEL
ALT: 18 U/L (ref 14–54)
AST: 14 U/L — AB (ref 15–41)
Albumin: 3.7 g/dL (ref 3.5–5.0)
Alkaline Phosphatase: 93 U/L (ref 38–126)
Anion gap: 10 (ref 5–15)
BUN: 34 mg/dL — AB (ref 6–20)
CHLORIDE: 97 mmol/L — AB (ref 101–111)
CO2: 25 mmol/L (ref 22–32)
CREATININE: 1.57 mg/dL — AB (ref 0.44–1.00)
Calcium: 9.5 mg/dL (ref 8.9–10.3)
GFR calc Af Amer: 41 mL/min — ABNORMAL LOW (ref >60)
GFR calc non Af Amer: 35 mL/min — ABNORMAL LOW (ref >60)
Glucose, Bld: 375 mg/dL — ABNORMAL HIGH (ref 65–99)
POTASSIUM: 3.8 mmol/L (ref 3.5–5.1)
SODIUM: 132 mmol/L — AB (ref 135–145)
Total Bilirubin: 0.6 mg/dL (ref 0.3–1.2)
Total Protein: 7 g/dL (ref 6.5–8.1)

## 2017-06-14 LAB — I-STAT BETA HCG BLOOD, ED (MC, WL, AP ONLY): I-stat hCG, quantitative: 8.5 m[IU]/mL — ABNORMAL HIGH (ref <5)

## 2017-06-14 LAB — CBC
HEMATOCRIT: 38.4 % (ref 36.0–46.0)
Hemoglobin: 12.6 g/dL (ref 12.0–15.0)
MCH: 25.6 pg — AB (ref 26.0–34.0)
MCHC: 32.8 g/dL (ref 30.0–36.0)
MCV: 77.9 fL — AB (ref 78.0–100.0)
PLATELETS: 331 10*3/uL (ref 150–400)
RBC: 4.93 MIL/uL (ref 3.87–5.11)
RDW: 14.6 % (ref 11.5–15.5)
WBC: 10.4 10*3/uL (ref 4.0–10.5)

## 2017-06-14 LAB — LIPASE, BLOOD: Lipase: 26 U/L (ref 11–51)

## 2017-06-14 NOTE — ED Notes (Signed)
Pt family reports her pain is getting worse and spreading.

## 2017-06-14 NOTE — ED Notes (Signed)
Pt complaining to staff about the wait time, states that she is is extreme pain and is mad that she will have to also have to wait to see the doctor once she gets to the back as well. Staff thanked the patient for her patience, and let her know that we will continue to check on her while she is waiting.

## 2017-06-14 NOTE — ED Notes (Signed)
Pt angry at staff, states "this waiting room wait time is so dangerous." Pt informed NF she is going to the cafeteria to eat.

## 2017-06-14 NOTE — ED Notes (Signed)
Pt yelled at staff that she is leaving and that she "better not get a bill because we did nothing for her". RN notified

## 2017-06-14 NOTE — ED Notes (Signed)
Spoke with pt regarding wait.  Pt yelling and stating that she needs something other than Tylenol or Motrin for pain.  Explained to patient that she is getting ready to go to treatment room as soon as a pt gets their discharge paperwork and the room is cleaned.  Pt states, "I better be back there in or I am leaving."  Encouraged pt to stay.

## 2017-06-14 NOTE — ED Triage Notes (Signed)
Pt here c/o LUQ abd pain with x 3 episodes of diarhhea, pt denies vomiting, c/o nausea, onset x 5 days, A&O x4

## 2017-06-15 ENCOUNTER — Emergency Department (HOSPITAL_COMMUNITY): Payer: BLUE CROSS/BLUE SHIELD

## 2017-06-15 ENCOUNTER — Encounter (HOSPITAL_COMMUNITY): Payer: Self-pay

## 2017-06-15 ENCOUNTER — Emergency Department (HOSPITAL_COMMUNITY)
Admission: EM | Admit: 2017-06-15 | Discharge: 2017-06-15 | Disposition: A | Discharge status: Home / Self Care | Payer: BLUE CROSS/BLUE SHIELD | Attending: Emergency Medicine | Admitting: Emergency Medicine

## 2017-06-15 ENCOUNTER — Other Ambulatory Visit: Payer: Self-pay

## 2017-06-15 DIAGNOSIS — E119 Type 2 diabetes mellitus without complications: Secondary | ICD-10-CM | POA: Diagnosis not present

## 2017-06-15 DIAGNOSIS — Z794 Long term (current) use of insulin: Secondary | ICD-10-CM | POA: Diagnosis not present

## 2017-06-15 DIAGNOSIS — Z79899 Other long term (current) drug therapy: Secondary | ICD-10-CM | POA: Diagnosis not present

## 2017-06-15 DIAGNOSIS — R1012 Left upper quadrant pain: Secondary | ICD-10-CM | POA: Diagnosis present

## 2017-06-15 DIAGNOSIS — Z8679 Personal history of other diseases of the circulatory system: Secondary | ICD-10-CM | POA: Insufficient documentation

## 2017-06-15 DIAGNOSIS — K2980 Duodenitis without bleeding: Secondary | ICD-10-CM | POA: Diagnosis not present

## 2017-06-15 DIAGNOSIS — I10 Essential (primary) hypertension: Secondary | ICD-10-CM | POA: Insufficient documentation

## 2017-06-15 DIAGNOSIS — Z720 Tobacco use: Secondary | ICD-10-CM | POA: Insufficient documentation

## 2017-06-15 LAB — COMPREHENSIVE METABOLIC PANEL
ALBUMIN: 4 g/dL (ref 3.5–5.0)
ALT: 18 U/L (ref 14–54)
AST: 14 U/L — AB (ref 15–41)
Alkaline Phosphatase: 94 U/L (ref 38–126)
Anion gap: 12 (ref 5–15)
BUN: 25 mg/dL — ABNORMAL HIGH (ref 6–20)
CHLORIDE: 98 mmol/L — AB (ref 101–111)
CO2: 25 mmol/L (ref 22–32)
CREATININE: 1.05 mg/dL — AB (ref 0.44–1.00)
Calcium: 9.5 mg/dL (ref 8.9–10.3)
GFR calc Af Amer: >60 mL/min (ref >60)
GFR, EST NON AFRICAN AMERICAN: 57 mL/min — AB (ref >60)
GLUCOSE: 395 mg/dL — AB (ref 65–99)
POTASSIUM: 4.4 mmol/L (ref 3.5–5.1)
Sodium: 135 mmol/L (ref 135–145)
Total Bilirubin: 0.2 mg/dL — ABNORMAL LOW (ref 0.3–1.2)
Total Protein: 7.3 g/dL (ref 6.5–8.1)

## 2017-06-15 LAB — URINALYSIS, ROUTINE W REFLEX MICROSCOPIC
Bacteria, UA: NONE SEEN
Bilirubin Urine: NEGATIVE
HGB URINE DIPSTICK: NEGATIVE
Ketones, ur: NEGATIVE mg/dL
Leukocytes, UA: NEGATIVE
Nitrite: NEGATIVE
PH: 5 (ref 5.0–8.0)
PROTEIN: NEGATIVE mg/dL
SPECIFIC GRAVITY, URINE: 1.016 (ref 1.005–1.030)

## 2017-06-15 LAB — CBC
HEMATOCRIT: 39.6 % (ref 36.0–46.0)
Hemoglobin: 13 g/dL (ref 12.0–15.0)
MCH: 25.9 pg — AB (ref 26.0–34.0)
MCHC: 32.8 g/dL (ref 30.0–36.0)
MCV: 78.9 fL (ref 78.0–100.0)
PLATELETS: 325 10*3/uL (ref 150–400)
RBC: 5.02 MIL/uL (ref 3.87–5.11)
RDW: 14.3 % (ref 11.5–15.5)
WBC: 10.8 10*3/uL — ABNORMAL HIGH (ref 4.0–10.5)

## 2017-06-15 LAB — LIPASE, BLOOD: LIPASE: 27 U/L (ref 11–51)

## 2017-06-15 LAB — CBG MONITORING, ED: Glucose-Capillary: 264 mg/dL — ABNORMAL HIGH (ref 65–99)

## 2017-06-15 MED ORDER — SODIUM CHLORIDE 0.9 % IV BOLUS
1000.0000 mL | Freq: Once | INTRAVENOUS | Status: AC
  Administered 2017-06-15: 1000 mL via INTRAVENOUS

## 2017-06-15 MED ORDER — IOPAMIDOL (ISOVUE-300) INJECTION 61%
INTRAVENOUS | Status: AC
  Filled 2017-06-15: qty 100

## 2017-06-15 MED ORDER — MORPHINE SULFATE (PF) 4 MG/ML IV SOLN
6.0000 mg | Freq: Once | INTRAVENOUS | Status: AC
  Administered 2017-06-15: 6 mg via INTRAVENOUS
  Filled 2017-06-15: qty 2

## 2017-06-15 MED ORDER — IOPAMIDOL (ISOVUE-300) INJECTION 61%
100.0000 mL | Freq: Once | INTRAVENOUS | Status: AC | PRN
  Administered 2017-06-15: 100 mL via INTRAVENOUS

## 2017-06-15 MED ORDER — PANTOPRAZOLE SODIUM 20 MG PO TBEC: 20.0000 mg | DELAYED_RELEASE_TABLET | Freq: Every day | ORAL | 0 refills | Status: DC

## 2017-06-15 MED ORDER — MORPHINE SULFATE (PF) 4 MG/ML IV SOLN: 4.0000 mg | Freq: Once | INTRAVENOUS | Status: DC

## 2017-06-15 MED ORDER — FAMOTIDINE 20 MG PO TABS
40.0000 mg | ORAL_TABLET | Freq: Once | ORAL | Status: AC
  Administered 2017-06-15: 40 mg via ORAL
  Filled 2017-06-15: qty 2

## 2017-06-15 MED ORDER — SODIUM CHLORIDE 0.9 % IJ SOLN
INTRAMUSCULAR | Status: AC
  Filled 2017-06-15: qty 50

## 2017-06-15 MED ORDER — SUCRALFATE 1 G PO TABS: 1.0000 g | ORAL_TABLET | Freq: Four times a day (QID) | ORAL | 0 refills | Status: DC

## 2017-06-15 MED ORDER — ONDANSETRON HCL 4 MG/2ML IJ SOLN
4.0000 mg | Freq: Once | INTRAMUSCULAR | Status: AC
  Administered 2017-06-15: 4 mg via INTRAVENOUS
  Filled 2017-06-15: qty 2

## 2017-06-15 MED ORDER — PANTOPRAZOLE SODIUM 40 MG IV SOLR
40.0000 mg | Freq: Once | INTRAVENOUS | Status: AC
  Administered 2017-06-15: 40 mg via INTRAVENOUS
  Filled 2017-06-15: qty 40

## 2017-06-15 MED ORDER — GI COCKTAIL ~~LOC~~
30.0000 mL | Freq: Once | ORAL | Status: AC
  Administered 2017-06-15: 30 mL via ORAL
  Filled 2017-06-15: qty 30

## 2017-06-15 MED ORDER — LORAZEPAM 2 MG/ML IJ SOLN
0.5000 mg | Freq: Once | INTRAMUSCULAR | Status: AC
  Administered 2017-06-15: 0.5 mg via INTRAVENOUS
  Filled 2017-06-15: qty 1

## 2017-06-15 MED ORDER — SODIUM CHLORIDE 0.9 % IV SOLN
INTRAVENOUS | Status: DC
  Administered 2017-06-15: 10:00:00 via INTRAVENOUS

## 2017-06-15 NOTE — ED Notes (Signed)
Urine culture sent with UA 

## 2017-06-15 NOTE — ED Provider Notes (Signed)
[facility] DEPT Provider Note   CSN: [id number] Arrival date & time: 06/15/17  0745     History   Chief Complaint Chief Complaint  Patient presents with  . Abdominal Pain    LUQ X 1WK    HPI Angela Burch is a 57 y.o. female.  57 year old female presents with several days of bilateral upper abdominal pain that has been persistently worse.  Pain characterizes sharp and worse with movement.  Has had nausea but no vomiting.  Watery diarrhea but no fever or chills.  No anginal type symptoms.  No prior history of same.  She went to [facility] yesterday and had blood work performed but was not evaluated by provider.  That blood work showed that patient to have evidence of renal insufficiency.  Patient did not stay for evaluation by provider.     Past Medical History:  Diagnosis Date  . Anxiety   . Diabetes mellitus without complication ([place])   . Hypertension     Patient Active Problem List   Diagnosis Date Noted  . Anxiety 06/10/2016  . Hypertension 06/10/2016  . Diabetes ([place]) 06/10/2016  . Hypoglycemia 06/10/2016  . Atrial flutter ([place]) 06/10/2016    Past Surgical History:  Procedure Laterality Date  . APPENDECTOMY    . CHOLECYSTECTOMY       OB History   None      Home Medications    Prior to Admission medications   Medication Sig Start Date End Date Taking? Authorizing Provider  amLODipine (NORVASC) 10 MG tablet Take 1 tablet (10 mg total) by mouth daily. 06/19/16   Clent Demark, PA-C  amphetamine-dextroamphetamine (ADDERALL) 30 MG tablet Take 1 tablet by mouth daily. 08/02/16   Clent Demark, PA-C  blood glucose meter kit and supplies KIT Dispense based on patient and insurance preference. Use up to four times daily as directed. (FOR ICD-9 250.00, 250.01). 06/19/16   Clent Demark, PA-C  glucose blood (TRUETEST TEST) test strip Use as instructed 06/21/16   Clent Demark, PA-C  hydrOXYzine (ATARAX/VISTARIL) 25 MG  tablet Take 1 tablet (25 mg total) by mouth at bedtime. 08/13/16   Clent Demark, PA-C  Insulin Glargine (LANTUS SOLOSTAR) 100 UNIT/ML Solostar Pen Inject 45 Units into the skin daily at 10 pm. 08/27/16   Tresa Garter, MD  Insulin Pen Needle (B-D UF III MINI PEN NEEDLES) 31G X 5 MM MISC 1 application by Does not apply route daily. 08/13/16   Clent Demark, PA-C  lisinopril (PRINIVIL,ZESTRIL) 10 MG tablet Take 1 tablet (10 mg total) by mouth daily. 06/19/16   Clent Demark, PA-C  propranolol (INDERAL) 20 MG tablet Take 1 tablet (20 mg total) by mouth 2 (two) times daily. 08/13/16   Clent Demark, PA-C  sertraline (ZOLOFT) 100 MG tablet Take 1 tablet (100 mg total) by mouth daily. 06/19/16   Clent Demark, PA-C  triamcinolone cream (KENALOG) 0.5 % APPLY 1 APPLICATION TOPICALLY 2 TIMES DAILY. DO NOT USE FOR MORE THAN 7 CONSECUTIVE DAYS WITHOUT MEDICAL APPROVAL. 07/19/16   Clent Demark, PA-C    Family History History reviewed. No pertinent family history.  Social History Social History   Tobacco Use  . Smoking status: Light Tobacco Smoker    Packs/day: 0.25    Types: Cigarettes  . Smokeless tobacco: Never Used  Substance Use Topics  . Alcohol use: No  . Drug use: No     Allergies   Sulfa antibiotics  and Metformin and related   Review of Systems Review of Systems  All other systems reviewed and are negative.    Physical Exam Updated Vital Signs BP 127/68 (BP Location: Right Arm)   Pulse 74   Temp 98.4 F (36.9 C) (Oral)   Resp 18   Ht 1.524 m (5')   Wt 77.6 kg (171 lb)   SpO2 100%   BMI 33.40 kg/m   Physical Exam  Constitutional: She is oriented to person, place, and time. She appears well-developed and well-nourished.  Non-toxic appearance. No distress.  HENT:  Head: Normocephalic and atraumatic.  Eyes: Pupils are equal, round, and reactive to light. Conjunctivae, EOM and lids are normal.  Neck: Normal range of motion. Neck supple. No  tracheal deviation present. No thyroid mass present.  Cardiovascular: Normal rate, regular rhythm and normal heart sounds. Exam reveals no gallop.  No murmur heard. Pulmonary/Chest: Effort normal and breath sounds normal. No stridor. No respiratory distress. She has no decreased breath sounds. She has no wheezes. She has no rhonchi. She has no rales.  Abdominal: Soft. Normal appearance and bowel sounds are normal. She exhibits no distension. There is tenderness in the epigastric area and left upper quadrant. There is guarding. There is no rigidity, no rebound and no CVA tenderness.    Musculoskeletal: Normal range of motion. She exhibits no edema or tenderness.  Neurological: She is alert and oriented to person, place, and time. She has normal strength. No cranial nerve deficit or sensory deficit. GCS eye subscore is 4. GCS verbal subscore is 5. GCS motor subscore is 6.  Skin: Skin is warm and dry. No abrasion and no rash noted.  Psychiatric: She has a normal mood and affect. Her speech is normal and behavior is normal.  Nursing note and vitals reviewed.    ED Treatments / Results  Labs (all labs ordered are listed, but only abnormal results are displayed) Labs Reviewed  LIPASE, BLOOD  COMPREHENSIVE METABOLIC PANEL  CBC  URINALYSIS, ROUTINE W REFLEX MICROSCOPIC    EKG None  Radiology No results found.  Procedures Procedures (including critical care time)  Medications Ordered in ED Medications  0.9 %  sodium chloride infusion (has no administration in time range)  sodium chloride 0.9 % bolus 1,000 mL (has no administration in time range)  LORazepam (ATIVAN) injection 0.5 mg (has no administration in time range)  ondansetron (ZOFRAN) injection 4 mg (has no administration in time range)  morphine 4 MG/ML injection 6 mg (has no administration in time range)     Initial Impression / Assessment and Plan / ED Course  I have reviewed the triage vital signs and the nursing  notes.  Pertinent labs & imaging results that were available during my care of the patient were reviewed by me and considered in my medical decision making (see chart for details).     Patient given IV fluids here along with analgesics and feels better.  Abdominal CT consistent with duodenitis.  Patient treated with IV Protonix.  Will prescribe the same and give GI referral.  Final Clinical Impressions(s) / ED Diagnoses   Final diagnoses:  None    ED Discharge Orders    None       Lacretia Leigh, MD 06/15/17 1327

## 2017-06-15 NOTE — ED Triage Notes (Signed)
PT C/O LUQ ABDOMINAL PAIN RADIATING ACROSS THE STOMACH X1 WK WITH CONSTANT NAUSEA AND DIARRHEA X2. DENIES VOMITING. PT ALSO STS INTERMITTENT CHILLS. DENIES URINARY SYMPTOMS, AS WELL.

## 2019-11-03 ENCOUNTER — Other Ambulatory Visit: Payer: Self-pay

## 2019-11-03 ENCOUNTER — Other Ambulatory Visit: Payer: Self-pay | Admitting: Critical Care Medicine

## 2019-11-03 DIAGNOSIS — Z20822 Contact with and (suspected) exposure to covid-19: Secondary | ICD-10-CM

## 2019-11-05 LAB — NOVEL CORONAVIRUS, NAA: SARS-CoV-2, NAA: NOT DETECTED

## 2019-11-05 LAB — SARS-COV-2, NAA 2 DAY TAT

## 2021-08-07 ENCOUNTER — Other Ambulatory Visit: Payer: Self-pay | Admitting: *Deleted

## 2021-08-07 DIAGNOSIS — R42 Dizziness and giddiness: Secondary | ICD-10-CM

## 2023-01-03 ENCOUNTER — Emergency Department (HOSPITAL_COMMUNITY): Payer: Managed Care, Other (non HMO)

## 2023-01-03 ENCOUNTER — Other Ambulatory Visit: Payer: Self-pay

## 2023-01-03 ENCOUNTER — Encounter (HOSPITAL_COMMUNITY): Payer: Self-pay

## 2023-01-03 ENCOUNTER — Inpatient Hospital Stay (HOSPITAL_COMMUNITY)
Admission: EM | Admit: 2023-01-03 | Discharge: 2023-01-06 | DRG: 092 | Disposition: A | Discharge status: Home / Self Care | Payer: Managed Care, Other (non HMO) | Attending: Internal Medicine | Admitting: Internal Medicine

## 2023-01-03 DIAGNOSIS — F419 Anxiety disorder, unspecified: Secondary | ICD-10-CM | POA: Diagnosis present

## 2023-01-03 DIAGNOSIS — I10 Essential (primary) hypertension: Secondary | ICD-10-CM | POA: Diagnosis present

## 2023-01-03 DIAGNOSIS — Z888 Allergy status to other drugs, medicaments and biological substances status: Secondary | ICD-10-CM

## 2023-01-03 DIAGNOSIS — R296 Repeated falls: Secondary | ICD-10-CM | POA: Diagnosis present

## 2023-01-03 DIAGNOSIS — R42 Dizziness and giddiness: Secondary | ICD-10-CM | POA: Diagnosis present

## 2023-01-03 DIAGNOSIS — W19XXXA Unspecified fall, initial encounter: Secondary | ICD-10-CM | POA: Diagnosis present

## 2023-01-03 DIAGNOSIS — R2689 Other abnormalities of gait and mobility: Secondary | ICD-10-CM | POA: Diagnosis present

## 2023-01-03 DIAGNOSIS — E782 Mixed hyperlipidemia: Secondary | ICD-10-CM | POA: Diagnosis present

## 2023-01-03 DIAGNOSIS — I6389 Other cerebral infarction: Secondary | ICD-10-CM | POA: Diagnosis not present

## 2023-01-03 DIAGNOSIS — Z9049 Acquired absence of other specified parts of digestive tract: Secondary | ICD-10-CM

## 2023-01-03 DIAGNOSIS — Z66 Do not resuscitate: Secondary | ICD-10-CM | POA: Diagnosis present

## 2023-01-03 DIAGNOSIS — Z79899 Other long term (current) drug therapy: Secondary | ICD-10-CM

## 2023-01-03 DIAGNOSIS — M25561 Pain in right knee: Secondary | ICD-10-CM | POA: Diagnosis present

## 2023-01-03 DIAGNOSIS — F17210 Nicotine dependence, cigarettes, uncomplicated: Secondary | ICD-10-CM | POA: Diagnosis present

## 2023-01-03 DIAGNOSIS — R918 Other nonspecific abnormal finding of lung field: Secondary | ICD-10-CM

## 2023-01-03 DIAGNOSIS — E1169 Type 2 diabetes mellitus with other specified complication: Secondary | ICD-10-CM | POA: Diagnosis not present

## 2023-01-03 DIAGNOSIS — K219 Gastro-esophageal reflux disease without esophagitis: Secondary | ICD-10-CM

## 2023-01-03 DIAGNOSIS — S2249XA Multiple fractures of ribs, unspecified side, initial encounter for closed fracture: Secondary | ICD-10-CM | POA: Diagnosis not present

## 2023-01-03 DIAGNOSIS — Z794 Long term (current) use of insulin: Secondary | ICD-10-CM

## 2023-01-03 DIAGNOSIS — Z882 Allergy status to sulfonamides status: Secondary | ICD-10-CM

## 2023-01-03 DIAGNOSIS — Y92009 Unspecified place in unspecified non-institutional (private) residence as the place of occurrence of the external cause: Secondary | ICD-10-CM | POA: Diagnosis not present

## 2023-01-03 DIAGNOSIS — R531 Weakness: Secondary | ICD-10-CM | POA: Diagnosis present

## 2023-01-03 DIAGNOSIS — E1142 Type 2 diabetes mellitus with diabetic polyneuropathy: Secondary | ICD-10-CM

## 2023-01-03 DIAGNOSIS — M25562 Pain in left knee: Secondary | ICD-10-CM | POA: Diagnosis present

## 2023-01-03 DIAGNOSIS — I639 Cerebral infarction, unspecified: Secondary | ICD-10-CM

## 2023-01-03 DIAGNOSIS — S2243XA Multiple fractures of ribs, bilateral, initial encounter for closed fracture: Secondary | ICD-10-CM | POA: Diagnosis present

## 2023-01-03 DIAGNOSIS — F32A Depression, unspecified: Secondary | ICD-10-CM | POA: Diagnosis present

## 2023-01-03 DIAGNOSIS — E119 Type 2 diabetes mellitus without complications: Secondary | ICD-10-CM

## 2023-01-03 DIAGNOSIS — T50915A Adverse effect of multiple unspecified drugs, medicaments and biological substances, initial encounter: Secondary | ICD-10-CM | POA: Diagnosis present

## 2023-01-03 DIAGNOSIS — S2239XA Fracture of one rib, unspecified side, initial encounter for closed fracture: Secondary | ICD-10-CM

## 2023-01-03 HISTORY — DX: Cerebral infarction, unspecified: I63.9

## 2023-01-03 LAB — URINALYSIS, ROUTINE W REFLEX MICROSCOPIC
Bacteria, UA: NONE SEEN
Bilirubin Urine: NEGATIVE
Glucose, UA: >=500 mg/dL — AB
Hgb urine dipstick: NEGATIVE
Ketones, ur: NEGATIVE mg/dL
Leukocytes,Ua: NEGATIVE
Nitrite: NEGATIVE
Protein, ur: NEGATIVE mg/dL
Specific Gravity, Urine: 1.007 (ref 1.005–1.030)
pH: 6 (ref 5.0–8.0)

## 2023-01-03 LAB — CBC
HCT: 34.6 % — ABNORMAL LOW (ref 36.0–46.0)
HCT: 38.3 % (ref 36.0–46.0)
Hemoglobin: 11 g/dL — ABNORMAL LOW (ref 12.0–15.0)
Hemoglobin: 12.4 g/dL (ref 12.0–15.0)
MCH: 26.5 pg (ref 26.0–34.0)
MCH: 26.9 pg (ref 26.0–34.0)
MCHC: 31.8 g/dL (ref 30.0–36.0)
MCHC: 32.4 g/dL (ref 30.0–36.0)
MCV: 83.4 fL (ref 80.0–100.0)
MCV: 83.1 fL (ref 80.0–100.0)
Platelets: 268 10*3/uL (ref 150–400)
Platelets: 283 10*3/uL (ref 150–400)
RBC: 4.15 MIL/uL (ref 3.87–5.11)
RBC: 4.61 MIL/uL (ref 3.87–5.11)
RDW: 14.5 % (ref 11.5–15.5)
RDW: 14.6 % (ref 11.5–15.5)
WBC: 10.4 10*3/uL (ref 4.0–10.5)
WBC: 9.2 10*3/uL (ref 4.0–10.5)
nRBC: 0 % (ref 0.0–0.2)
nRBC: 0 % (ref 0.0–0.2)

## 2023-01-03 LAB — BASIC METABOLIC PANEL
Anion gap: 8 (ref 5–15)
BUN: 12 mg/dL (ref 8–23)
CO2: 29 mmol/L (ref 22–32)
Calcium: 9.3 mg/dL (ref 8.9–10.3)
Chloride: 102 mmol/L (ref 98–111)
Creatinine, Ser: 0.53 mg/dL (ref 0.44–1.00)
GFR, Estimated: >60 mL/min (ref >60)
Glucose, Bld: 118 mg/dL — ABNORMAL HIGH (ref 70–99)
Potassium: 3.8 mmol/L (ref 3.5–5.1)
Sodium: 139 mmol/L (ref 135–145)

## 2023-01-03 LAB — RAPID URINE DRUG SCREEN, HOSP PERFORMED
Amphetamines: POSITIVE — AB
Barbiturates: NOT DETECTED
Benzodiazepines: POSITIVE — AB
Cocaine: NOT DETECTED
Opiates: POSITIVE — AB
Tetrahydrocannabinol: POSITIVE — AB

## 2023-01-03 LAB — CREATININE, SERUM
Creatinine, Ser: 0.49 mg/dL (ref 0.44–1.00)
GFR, Estimated: >60 mL/min (ref >60)

## 2023-01-03 LAB — CBG MONITORING, ED
Glucose-Capillary: 94 mg/dL (ref 70–99)
Glucose-Capillary: 86 mg/dL (ref 70–99)

## 2023-01-03 LAB — HEMOGLOBIN A1C
Hgb A1c MFr Bld: 9.9 % — ABNORMAL HIGH (ref 4.8–5.6)
Mean Plasma Glucose: 237.43 mg/dL

## 2023-01-03 MED ORDER — STROKE: EARLY STAGES OF RECOVERY BOOK
Freq: Once | Status: AC
  Filled 2023-01-03: qty 1

## 2023-01-03 MED ORDER — INSULIN GLARGINE-YFGN 100 UNIT/ML ~~LOC~~ SOLN
40.0000 [IU] | Freq: Every day | SUBCUTANEOUS | Status: DC
Start: 2023-01-04 — End: 2023-01-06
  Administered 2023-01-04 – 2023-01-06 (×3): 40 [IU] via SUBCUTANEOUS
  Filled 2023-01-03 (×3): qty 0.4

## 2023-01-03 MED ORDER — PANTOPRAZOLE SODIUM 40 MG PO TBEC
40.0000 mg | DELAYED_RELEASE_TABLET | Freq: Every day | ORAL | Status: DC
  Administered 2023-01-04 – 2023-01-06 (×3): 40 mg via ORAL
  Filled 2023-01-03 (×3): qty 1

## 2023-01-03 MED ORDER — LISINOPRIL 10 MG PO TABS: 10.0000 mg | ORAL_TABLET | Freq: Every day | ORAL | Status: DC

## 2023-01-03 MED ORDER — IOHEXOL 350 MG/ML SOLN
75.0000 mL | Freq: Once | INTRAVENOUS | Status: AC | PRN
  Administered 2023-01-03: 75 mL via INTRAVENOUS

## 2023-01-03 MED ORDER — THIAMINE HCL 100 MG/ML IJ SOLN: 100.0000 mg | Freq: Every day | INTRAMUSCULAR | Status: DC

## 2023-01-03 MED ORDER — INSULIN ASPART 100 UNIT/ML IJ SOLN
0.0000 [IU] | Freq: Three times a day (TID) | INTRAMUSCULAR | Status: DC
  Administered 2023-01-04: 2 [IU] via SUBCUTANEOUS
  Administered 2023-01-04: 3 [IU] via SUBCUTANEOUS
  Administered 2023-01-05 (×2): 2 [IU] via SUBCUTANEOUS
  Administered 2023-01-06: 1 [IU] via SUBCUTANEOUS
  Filled 2023-01-03: qty 0.15

## 2023-01-03 MED ORDER — ACETAMINOPHEN 325 MG PO TABS
650.0000 mg | ORAL_TABLET | Freq: Four times a day (QID) | ORAL | Status: DC | PRN
  Administered 2023-01-06: 650 mg via ORAL

## 2023-01-03 MED ORDER — HYDROXYZINE HCL 25 MG PO TABS
25.0000 mg | ORAL_TABLET | Freq: Every day | ORAL | Status: DC
  Administered 2023-01-04 – 2023-01-05 (×3): 25 mg via ORAL
  Filled 2023-01-03 (×3): qty 1

## 2023-01-03 MED ORDER — BUPROPION HCL ER (XL) 150 MG PO TB24
150.0000 mg | ORAL_TABLET | Freq: Every day | ORAL | Status: DC
  Administered 2023-01-04 – 2023-01-06 (×3): 150 mg via ORAL
  Filled 2023-01-03 (×3): qty 1

## 2023-01-03 MED ORDER — NICOTINE POLACRILEX 2 MG MT GUM: 2.0000 mg | CHEWING_GUM | OROMUCOSAL | Status: DC | PRN

## 2023-01-03 MED ORDER — ACETAMINOPHEN 650 MG RE SUPP: 650.0000 mg | Freq: Four times a day (QID) | RECTAL | Status: DC | PRN

## 2023-01-03 MED ORDER — DILTIAZEM HCL ER COATED BEADS 180 MG PO CP24: 180.0000 mg | ORAL_CAPSULE | Freq: Every day | ORAL | Status: DC

## 2023-01-03 MED ORDER — ASPIRIN 81 MG PO CHEW
81.0000 mg | CHEWABLE_TABLET | Freq: Every day | ORAL | Status: DC
  Administered 2023-01-04 – 2023-01-06 (×3): 81 mg via ORAL
  Filled 2023-01-03 (×3): qty 1

## 2023-01-03 MED ORDER — ENOXAPARIN SODIUM 40 MG/0.4ML IJ SOSY
40.0000 mg | PREFILLED_SYRINGE | INTRAMUSCULAR | Status: DC
  Administered 2023-01-04 – 2023-01-06 (×3): 40 mg via SUBCUTANEOUS
  Filled 2023-01-03 (×3): qty 0.4

## 2023-01-03 MED ORDER — DIAZEPAM 5 MG/ML IJ SOLN
2.5000 mg | Freq: Once | INTRAMUSCULAR | Status: AC | PRN
  Administered 2023-01-04: 2.5 mg via INTRAVENOUS
  Filled 2023-01-03: qty 2

## 2023-01-03 MED ORDER — ROSUVASTATIN CALCIUM 10 MG PO TABS
10.0000 mg | ORAL_TABLET | Freq: Every day | ORAL | Status: DC
  Administered 2023-01-04 – 2023-01-06 (×3): 10 mg via ORAL
  Filled 2023-01-03 (×3): qty 1

## 2023-01-03 MED ORDER — AMPHETAMINE-DEXTROAMPHETAMINE 10 MG PO TABS
30.0000 mg | ORAL_TABLET | Freq: Every day | ORAL | Status: DC
  Administered 2023-01-05: 30 mg via ORAL
  Filled 2023-01-03: qty 3

## 2023-01-03 MED ORDER — POLYETHYLENE GLYCOL 3350 17 G PO PACK
17.0000 g | PACK | Freq: Every day | ORAL | Status: DC | PRN
  Administered 2023-01-05 – 2023-01-06 (×2): 17 g via ORAL
  Filled 2023-01-03 (×2): qty 1

## 2023-01-03 MED ORDER — MECLIZINE HCL 25 MG PO TABS
25.0000 mg | ORAL_TABLET | Freq: Once | ORAL | Status: AC
  Administered 2023-01-03: 25 mg via ORAL
  Filled 2023-01-03: qty 1

## 2023-01-03 MED ORDER — OXYCODONE HCL 5 MG PO TABS
5.0000 mg | ORAL_TABLET | ORAL | Status: DC | PRN
  Administered 2023-01-04 – 2023-01-06 (×10): 5 mg via ORAL
  Filled 2023-01-03 (×11): qty 1

## 2023-01-03 MED ORDER — IOHEXOL 300 MG/ML  SOLN
100.0000 mL | Freq: Once | INTRAMUSCULAR | Status: AC | PRN
  Administered 2023-01-03: 80 mL via INTRAVENOUS

## 2023-01-03 MED ORDER — CLONAZEPAM 0.5 MG PO TABS
0.5000 mg | ORAL_TABLET | Freq: Every day | ORAL | Status: DC | PRN
  Administered 2023-01-04 – 2023-01-05 (×2): 0.5 mg via ORAL
  Filled 2023-01-03 (×2): qty 1

## 2023-01-03 MED ORDER — ACETAMINOPHEN 325 MG PO TABS
650.0000 mg | ORAL_TABLET | ORAL | Status: DC
  Administered 2023-01-03 – 2023-01-06 (×12): 650 mg via ORAL
  Filled 2023-01-03 (×14): qty 2

## 2023-01-03 MED ORDER — INSULIN ASPART 100 UNIT/ML IJ SOLN
0.0000 [IU] | Freq: Every day | INTRAMUSCULAR | Status: DC
  Administered 2023-01-04: 2 [IU] via SUBCUTANEOUS
  Filled 2023-01-03: qty 0.05

## 2023-01-03 MED ORDER — SODIUM CHLORIDE 0.9% FLUSH
3.0000 mL | Freq: Two times a day (BID) | INTRAVENOUS | Status: DC
  Administered 2023-01-04 – 2023-01-06 (×6): 3 mL via INTRAVENOUS

## 2023-01-03 MED ORDER — AMLODIPINE BESYLATE 5 MG PO TABS: 10.0000 mg | ORAL_TABLET | Freq: Every day | ORAL | Status: DC

## 2023-01-03 MED ORDER — MORPHINE SULFATE (PF) 2 MG/ML IV SOLN
2.0000 mg | INTRAVENOUS | Status: DC | PRN
  Administered 2023-01-03 – 2023-01-06 (×5): 2 mg via INTRAVENOUS
  Filled 2023-01-03 (×5): qty 1

## 2023-01-03 MED ORDER — EMPAGLIFLOZIN 10 MG PO TABS
10.0000 mg | ORAL_TABLET | Freq: Every day | ORAL | Status: DC
  Administered 2023-01-04 – 2023-01-06 (×3): 10 mg via ORAL
  Filled 2023-01-03 (×3): qty 1

## 2023-01-03 MED ORDER — GABAPENTIN 100 MG PO CAPS
200.0000 mg | ORAL_CAPSULE | Freq: Two times a day (BID) | ORAL | Status: DC
  Administered 2023-01-04 – 2023-01-06 (×6): 200 mg via ORAL
  Filled 2023-01-03 (×6): qty 2

## 2023-01-03 MED ORDER — ACETAMINOPHEN 500 MG PO TABS: 1000.0000 mg | ORAL_TABLET | Freq: Once | ORAL | Status: DC

## 2023-01-03 NOTE — ED Triage Notes (Addendum)
EMS reports from home, daughter called for frequent falls the last week. Pt states had fall today and c/o left flank and right lower back pain. No blood thinners, no obvious injury and ambulatory on scene with assistance.Pt lives alone.   BP 150/76 HR 76 RR 16 Sp02 95 RA CBG 151  20ga LAC

## 2023-01-03 NOTE — H&P (Addendum)
History and Physical    Patient: Angela Burch:[id number] DOB: 1960/06/27 DOA: 01/03/2023 DOS: the patient was seen and examined on 01/03/2023 PCP: Loletta Specter, PA-C  Patient coming from: Home  Chief Complaint:  Chief Complaint  Patient presents with   Fall   Dizziness   Weakness   HPI: Angela Burch is a 62 y.o. female with medical history significant of chronic vertigo, patient has vertiginous attacks about once every day or less frequently.  Patient takes several medications including gabapentin, meclizine as well as Zofran to abate her attacks.  And generally she is able to function well after that.  However patient describes a slowly progressive gait instability for last several months but particularly notable over the last 7 days and then especially worse over the last 72 hours.  Patient describes that randomly her legs/knee seem to give out causing her to fall.  Her daughter however describes that patient is able to get up and start walking, however then she seems to lean forward and then start walking fast in an attempt to as if catch up with her center of gravity till she starts running and then eventually falls face forward onto the floor or what ever is in front of her.  This has happened multiple times over the last 72 hours.  Patient finally fell last evening in the bathroom onto chair hitting her left area of the chest onto the chair.  Complicated by severe chest pain.  And remaining on the floor since then for 13 hours still she was discovered by daughter today via video call.  There is no report of loss of consciousness vomiting diarrhea fever.  There is no report of any focal weakness incontinence or trouble swallowing or speech changes.  When daughter spoke with the patient over the phone today the patient did seem "incoherent.  "It is something that is reported to me by the ER provider who took care of the patient initially as well.  Patient seem to be asking the  same question over and over again and once during this encounter patient asked for a family member who lives in [place].  Otherwise I found that the patient was reasonably coherent and giving me history of both recent and report remote occurrences.  Medical evaluation is sought.  ER workup is notable for rib fractures. Review of Systems: As mentioned in the history of present illness. All other systems reviewed and are negative. Past Medical History:  Diagnosis Date   Anxiety    Diabetes mellitus without complication (HCC)    Hypertension    Past Surgical History:  Procedure Laterality Date   APPENDECTOMY     CHOLECYSTECTOMY     Social History:  reports that she has been smoking cigarettes. She has never used smokeless tobacco. She reports that she does not drink alcohol and does not use drugs.  Allergies  Allergen Reactions   Sulfa Antibiotics Anaphylaxis and Hives   Metformin And Related     History reviewed. No pertinent family history.  Prior to Admission medications   Medication Sig Start Date End Date Taking? Authorizing Provider  amLODipine (NORVASC) 10 MG tablet Take 1 tablet (10 mg total) by mouth daily. Patient not taking: Reported on 06/15/2017 06/19/16   Loletta Specter, PA-C  amphetamine-dextroamphetamine (ADDERALL) 30 MG tablet Take 1 tablet by mouth daily. 08/02/16   Loletta Specter, PA-C  blood glucose meter kit and supplies KIT Dispense based on patient and insurance preference. Use up  to four times daily as directed. (FOR ICD-9 250.00, 250.01). 06/19/16   Loletta Specter, PA-C  buPROPion (WELLBUTRIN XL) 150 MG 24 hr tablet Take 150 mg by mouth daily. 05/13/17   [provider]  CARTIA XT 120 MG 24 hr capsule Take 120 mg by mouth daily. 05/13/17   [provider]  clonazePAM (KLONOPIN) 1 MG tablet Take 1 mg by mouth daily as needed for anxiety.  03/18/17   [provider]  glucose blood (TRUETEST TEST) test strip Use as instructed  06/21/16   Loletta Specter, PA-C  hydrOXYzine (ATARAX/VISTARIL) 25 MG tablet Take 1 tablet (25 mg total) by mouth at bedtime. Patient not taking: Reported on 06/15/2017 08/13/16   Loletta Specter, PA-C  Insulin Glargine (LANTUS SOLOSTAR) 100 UNIT/ML Solostar Pen Inject 45 Units into the skin daily at 10 pm. Patient not taking: Reported on 06/15/2017 08/27/16   Quentin Angst, MD  Insulin Pen Needle (B-D UF III MINI PEN NEEDLES) 31G X 5 MM MISC 1 application by Does not apply route daily. 08/13/16   Loletta Specter, PA-C  lisinopril (PRINIVIL,ZESTRIL) 10 MG tablet Take 1 tablet (10 mg total) by mouth daily. Patient not taking: Reported on 06/15/2017 06/19/16   Loletta Specter, PA-C  losartan (COZAAR) 100 MG tablet Take 100 mg by mouth daily. 05/13/17   [provider]  NOVOLIN N RELION 100 UNIT/ML injection Inject 20-50 Units into the skin 2 (two) times daily. Sliding scale - depends on BG 05/15/17   [provider]  pantoprazole (PROTONIX) 20 MG tablet Take 1 tablet (20 mg total) by mouth daily. 06/15/17   Lorre Nick, MD  propranolol (INDERAL) 20 MG tablet Take 1 tablet (20 mg total) by mouth 2 (two) times daily. Patient not taking: Reported on 06/15/2017 08/13/16   Loletta Specter, PA-C  sertraline (ZOLOFT) 100 MG tablet Take 1 tablet (100 mg total) by mouth daily. Patient not taking: Reported on 06/15/2017 06/19/16   Loletta Specter, PA-C  sucralfate (CARAFATE) 1 g tablet Take 1 tablet (1 g total) by mouth 4 (four) times daily. 06/15/17   Lorre Nick, MD  triamcinolone cream (KENALOG) 0.5 % APPLY 1 APPLICATION TOPICALLY 2 TIMES DAILY. DO NOT USE FOR MORE THAN 7 CONSECUTIVE DAYS WITHOUT MEDICAL APPROVAL. 07/19/16   Loletta Specter, PA-C    Physical Exam: Vitals:   01/03/23 2000 01/03/23 2045 01/03/23 2059 01/03/23 2100  BP: (!) 151/67 (!) 157/74  (!) 151/69  Pulse: 81 82  79  Resp: 17 20  13   Temp:   98.2 F (36.8 C)   TempSrc:   Oral   SpO2: 96% 97%  97%    General: Patient is awake, slightly fatigued appearing.  Patient talks in a manner of jumping from 1 topic to another.  But gives a reasonably coherent account of her prior symptoms.  Does not appear to be immediately distressed.  However occasional has severe left-sided chest pain Respiratory exam: Bilateral air entry vesicular, Cardiovascular exam S1-S2 normal Abdomen all quadrants are soft nontender Extremities warm without edema Neurologically: Symmetric facies, no focal motor deficit in either extremity. Data Reviewed:  Labs on Admission:  Results for orders placed or performed during the hospital encounter of 01/03/23 (from the past 24 hour(s))  Basic metabolic panel     Status: Abnormal   Collection Time: 01/03/23  3:15 PM  Result Value Ref Range   Sodium 139 135 - 145 mmol/L   Potassium 3.8 3.5 -  5.1 mmol/L   Chloride 102 98 - 111 mmol/L   CO2 29 22 - 32 mmol/L   Glucose, Bld 118 (H) 70 - 99 mg/dL   BUN 12 8 - 23 mg/dL   Creatinine, Ser 4.54 0.44 - 1.00 mg/dL   Calcium 9.3 8.9 - 09.8 mg/dL   GFR, Estimated >11 >91 mL/min   Anion gap 8 5 - 15  CBC     Status: Abnormal   Collection Time: 01/03/23  3:15 PM  Result Value Ref Range   WBC 10.4 4.0 - 10.5 K/uL   RBC 4.15 3.87 - 5.11 MIL/uL   Hemoglobin 11.0 (L) 12.0 - 15.0 g/dL   HCT 47.8 (L) 29.5 - 62.1 %   MCV 83.4 80.0 - 100.0 fL   MCH 26.5 26.0 - 34.0 pg   MCHC 31.8 30.0 - 36.0 g/dL   RDW 30.8 65.7 - 84.6 %   Platelets 268 150 - 400 K/uL   nRBC 0.0 0.0 - 0.2 %  CBG monitoring, ED     Status: None   Collection Time: 01/03/23  3:38 PM  Result Value Ref Range   Glucose-Capillary 94 70 - 99 mg/dL  Urinalysis, Routine w reflex microscopic -Urine, Clean Catch     Status: Abnormal   Collection Time: 01/03/23  4:29 PM  Result Value Ref Range   Color, Urine YELLOW YELLOW   APPearance CLEAR CLEAR   Specific Gravity, Urine 1.007 1.005 - 1.030   pH 6.0 5.0 - 8.0   Glucose, UA >=500 (A) NEGATIVE mg/dL   Hgb urine dipstick  NEGATIVE NEGATIVE   Bilirubin Urine NEGATIVE NEGATIVE   Ketones, ur NEGATIVE NEGATIVE mg/dL   Protein, ur NEGATIVE NEGATIVE mg/dL   Nitrite NEGATIVE NEGATIVE   Leukocytes,Ua NEGATIVE NEGATIVE   RBC / HPF 0-5 0 - 5 RBC/hpf   WBC, UA 0-5 0 - 5 WBC/hpf   Bacteria, UA NONE SEEN NONE SEEN   Squamous Epithelial / HPF 0-5 0 - 5 /HPF  Rapid urine drug screen (hospital performed)     Status: Abnormal   Collection Time: 01/03/23  4:31 PM  Result Value Ref Range   Opiates POSITIVE (A) NONE DETECTED   Cocaine NONE DETECTED NONE DETECTED   Benzodiazepines POSITIVE (A) NONE DETECTED   Amphetamines POSITIVE (A) NONE DETECTED   Tetrahydrocannabinol POSITIVE (A) NONE DETECTED   Barbiturates NONE DETECTED NONE DETECTED  CBG monitoring, ED     Status: None   Collection Time: 01/03/23  9:50 PM  Result Value Ref Range   Glucose-Capillary 86 70 - 99 mg/dL   Basic Metabolic Panel: Recent Labs  Lab 01/03/23 1515  NA 139  K 3.8  CL 102  CO2 29  GLUCOSE 118*  BUN 12  CREATININE 0.53  CALCIUM 9.3   Liver Function Tests: No results for input(s): "AST", "ALT", "ALKPHOS", "BILITOT", "PROT", "ALBUMIN" in the last 168 hours. No results for input(s): "LIPASE", "AMYLASE" in the last 168 hours. No results for input(s): "AMMONIA" in the last 168 hours. CBC: Recent Labs  Lab 01/03/23 1515  WBC 10.4  HGB 11.0*  HCT 34.6*  MCV 83.4  PLT 268   Cardiac Enzymes: No results for input(s): "CKTOTAL", "CKMB", "CKMBINDEX", "TROPONINIHS" in the last 168 hours.  BNP (last 3 results) No results for input(s): "PROBNP" in the last 8760 hours. CBG: Recent Labs  Lab 01/03/23 1538 01/03/23 2150  GLUCAP 94 86    Radiological Exams on Admission:  CT CHEST ABDOMEN PELVIS W CONTRAST  Result Date:  01/03/2023 CLINICAL DATA:  History of frequent falls. Flank pain. Right lower back pain. EXAM: CT CHEST, ABDOMEN, AND PELVIS WITH CONTRAST TECHNIQUE: Multidetector CT imaging of the chest, abdomen and pelvis was  performed following the standard protocol during bolus administration of intravenous contrast. RADIATION DOSE REDUCTION: This exam was performed according to the departmental dose-optimization program which includes automated exposure control, adjustment of the mA and/or kV according to patient size and/or use of iterative reconstruction technique. CONTRAST:  80mL OMNIPAQUE IOHEXOL 300 MG/ML  SOLN COMPARISON:  CT abdomen/pelvis dated June 15, 2017. FINDINGS: CT CHEST FINDINGS Cardiovascular: No significant vascular findings. Normal heart size. No pericardial effusion. Thoracic aorta is normal in caliber with mild atherosclerosis. Mediastinum/Nodes: No enlarged mediastinal, hilar, or axillary lymph nodes. Thyroid gland, trachea, and esophagus demonstrate no significant findings. Lungs/Pleura: The lungs are generally well aerated. Bibasilar left-greater-than-right linear atelectasis/scarring. Nodular opacities in the right upper lobe measuring 10 x 7 mm and 8 x 7 mm (series 6, image 61). Calcified granuloma in the right upper lobe. Bilateral lower lobe cylindrical bronchiectasis. Musculoskeletal: Mildly displaced left posterior ninth and tenth rib fractures (series 2, images 34 and 40). Minimally displaced right lateral sixth rib fracture (series 2, image 22). CT ABDOMEN PELVIS FINDINGS Hepatobiliary: No focal liver abnormality is seen. Status post cholecystectomy. No biliary dilatation. No hepatic injury or perihepatic hematoma. Pancreas: Fatty atrophy of the pancreas. No surrounding inflammatory changes. Spleen: No splenic injury or perisplenic hematoma. Adrenals/Urinary Tract: The adrenal glands are unremarkable. The kidneys enhance symmetrically. No suspicious focal lesion. No renal calculi or hydronephrosis. Vicarious excreted contrast is noted within the bilateral renal collecting systems and bladder. Bladder is otherwise unremarkable. Stomach/Bowel: Stomach is within normal limits. The visualized small bowel  and colon are otherwise unremarkable. No evidence of obstruction. Vascular/Lymphatic: Aortic atherosclerosis. No enlarged abdominal or pelvic lymph nodes. Reproductive: Uterus and bilateral adnexa are unremarkable. Other: No abdominopelvic ascites.  No free air. Musculoskeletal: Bilateral rib fractures, as described above. No additional fracture identified. IMPRESSION: 1. Mildly displaced left posterior ninth and tenth rib fractures and right lateral sixth rib fracture. 2. No acute traumatic findings in the abdomen or pelvis. 3. Nodular opacities in the right upper lobe measuring up to 10 mm, could relate to a infectious/inflammatory etiology. Non-contrast chest CT at 3-6 months is recommended. If the nodules are stable at time of repeat CT, then future CT at 18-24 months (from today's scan) is considered optional for low-risk patients, but is recommended for high-risk patients. This recommendation follows the consensus statement: Guidelines for Management of Incidental Pulmonary Nodules Detected on CT Images: From the Fleischner Society 2017; Radiology 2017; 284:228-243. Electronically Signed   By: Hart Robinsons M.D.   On: 01/03/2023 20:36   CT L-SPINE NO CHARGE  Result Date: 01/03/2023 CLINICAL DATA:  Frequent falls over the last week. Fall today. Left flank pain. Right lower back pain. EXAM: CT LUMBAR SPINE WITHOUT CONTRAST TECHNIQUE: Multidetector CT imaging of the lumbar spine was performed without intravenous contrast administration. Multiplanar CT image reconstructions were also generated. RADIATION DOSE REDUCTION: This exam was performed according to the departmental dose-optimization program which includes automated exposure control, adjustment of the mA and/or kV according to patient size and/or use of iterative reconstruction technique. COMPARISON:  CT of the abdomen and pelvis 06/15/2017 FINDINGS: Segmentation: 5 non rib-bearing lumbar type vertebral bodies are present. The lowest fully formed  vertebral body is L5. Alignment: Grade 1 anterolisthesis is present at L4-5. No other significant listhesis is present. Mild straightening  of the normal lumbar lordosis scratched at lumbar lordosis is otherwise stable. Vertebrae: Vertebral body heights are normal. No acute fractures are present. Paraspinal and other soft tissues: Atherosclerotic calcifications are present in the aorta. Paraspinous soft tissues are within normal limits. Please see dedicated CT of the abdomen and pelvis for further description of intra-abdominal organs. Disc levels: L1-2: Insert normal disc L2-3: A remote fracture or secondary ossification center is present at the superior articulating facet of the L3 vertebral body on the right. No significant stenosis is associated. L3-4: Mild disc bulging and facet hypertrophy is present without significant stenosis. L4-5: Uncovering of a broad-based disc protrusion is present. Advanced facet hypertrophy and ligamentum flavum thickening present. Vacuum phenomena are present within the facet joints bilaterally. Moderate central canal stenosis is present. Moderate foraminal narrowing is present, left greater than right. L5-S1: Asymmetric right-sided facet hypertrophy is present. No significant disc protrusion is present. Mild right foraminal narrowing is present. IMPRESSION: 1. No acute fracture or traumatic subluxation. 2. Remote fracture or secondary ossification center at the superior articulating facet of the L3 vertebral body on the right. No significant stenosis is associated. 3. Moderate central canal stenosis and moderate foraminal narrowing at L4-5, left greater than right. 4. Mild right foraminal narrowing at L5-S1. 5.  Aortic Atherosclerosis (ICD10-I70.0). Electronically Signed   By: Marin Roberts M.D.   On: 01/03/2023 17:32   CT ANGIO HEAD NECK W WO CM  Result Date: 01/03/2023 CLINICAL DATA:  Vertigo, central.  Dizziness.  Fall. EXAM: CT ANGIOGRAPHY HEAD AND NECK WITH AND  WITHOUT CONTRAST TECHNIQUE: Multidetector CT imaging of the head and neck was performed using the standard protocol during bolus administration of intravenous contrast. Multiplanar CT image reconstructions and MIPs were obtained to evaluate the vascular anatomy. Carotid stenosis measurements (when applicable) are obtained utilizing NASCET criteria, using the distal internal carotid diameter as the denominator. RADIATION DOSE REDUCTION: This exam was performed according to the departmental dose-optimization program which includes automated exposure control, adjustment of the mA and/or kV according to patient size and/or use of iterative reconstruction technique. CONTRAST:  75mL OMNIPAQUE IOHEXOL 350 MG/ML SOLN COMPARISON:  None Available. FINDINGS: CT HEAD FINDINGS Brain: No acute hemorrhage. 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 Cortical gray-white differentiation is preserved. No hydrocephalus or extra-axial collection. No mass effect or midline shift. Vascular: No hyperdense vessel or unexpected calcification. Skull: No calvarial fracture or suspicious bone lesion. Skull base is unremarkable. Sinuses/Orbits: No acute finding. Other: None. Review of the MIP images confirms the above findings CTA NECK FINDINGS Aortic arch: Three-vessel arch configuration. Arch vessel origins are patent. Right carotid system: No evidence of dissection, stenosis (50% or greater), or occlusion. Mild mixed plaque along the right carotid bulb. Left carotid system: No evidence of dissection, stenosis (50% or greater), or occlusion. Mild mixed plaque along the proximal left cervical ICA. Vertebral arteries: Right dominant. No evidence of dissection, stenosis (50% or greater), or occlusion. Skeleton: Unremarkable. Other neck: Unremarkable. Upper chest: Unremarkable. Review of the MIP images confirms the above findings CTA HEAD FINDINGS Anterior circulation: Calcified  plaque along the carotid siphons without hemodynamically significant stenosis. The proximal ACAs and MCAs are patent without stenosis or aneurysm. Distal branches are symmetric. Posterior circulation: Normal basilar artery. The SCAs, AICAs and PICAs are patent proximally. The PCAs are patent proximally without stenosis or aneurysm. Distal branches are symmetric. Venous sinuses: As permitted by contrast timing, patent. Anatomic variants: Persistent fetal origin of  the bilateral PCAs with hypoplastic P1 segments. Review of the MIP images confirms the above findings IMPRESSION: 1. No acute intracranial abnormality. 2. No large vessel occlusion, hemodynamically significant stenosis, or aneurysm in the head or neck. 3. 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 Electronically Signed   By: Orvan Falconer M.D.   On: 01/03/2023 16:58    EKG: Independently reviewed. NSR   Assessment and Plan: Rib fracture Traumatic. See ct chest. No flail chest. No hypoxia. Pain meds ordered. Incentive spirometry. Continuous pulse ox.  Falls Patient and daughter at the bedside describe chronic progressive pattern of falling.  Due to gait instability rather than focal weakness.  See HPI for further details.  At this time patient is noted to have multiple toxins in urine which are not prescribed including cannabinoids, benzodiazepines.  Further patient apparently took prior dose of opiate for pain control without prescription.  We will wean down patient polypharmacy and have physical therapy evaluate the patient.  I will check B12 as well as thyroid cascade to make sure were not dealing with any occult hormonal or vitamin issue.  CVA (cerebral vascular accident) [facility]) Incidentally : Patient is noted to have focal subcortical hypoattenuation on CAT scan in the left parietal lobe with associated volume loss suggestive of chronic infarction.  I will do a limited  stroke workup including maintaining patient on telemetry, echo, lipid panel.  Routine MRI. Aspirin. Smokin cessation advised.  Lung nodules CAT scan above, incidental, repeat CAT scan as advised.  Diabetes (HCC) Continue with home insulin regimen, add insulin sliding scale to monitor glucose and for occult hypoglycemia  Hypertension Home medications are reviewed as collected by pharmacy.  Patient is supposedly both on propranolol and diltiazem at home.  However has not been taking propranolol frequently.   One of Patient BP recorded in the ER is 84/67 documented at 7:14 PM. Hold off on anti htn regimen tonight. Reinitiate in AM. Check routine orhtostatic in aM  Anxiety Continue with bupropion, continue with patient home dose of Adderall which she seems to skip a few doses of occasionally.  reduce clonazepam to 0.5 mg as needed daily for anxiety from 1 mg given falls.   Dvt ppx will be ordred.   Advance Care Planning:   Code Status: Prior patient wishes to Be DNR. Ok to intubate.  Consults: none at this time.  Family Communication: daughter at bedside. All questions answered.  Severity of Illness: The appropriate patient status for this patient is OBSERVATION. Observation status is judged to be reasonable and necessary in order to provide the required intensity of service to ensure the patient's safety. The patient's presenting symptoms, physical exam findings, and initial radiographic and laboratory data in the context of their medical condition is felt to place them at decreased risk for further clinical deterioration. Furthermore, it is anticipated that the patient will be medically stable for discharge from the hospital within 2 midnights of admission.   Author: Nolberto Hanlon, MD 01/03/2023 9:54 PM  For on call review [email or web address].

## 2023-01-03 NOTE — Assessment & Plan Note (Signed)
Continue with home insulin regimen, add insulin sliding scale to monitor glucose and for occult hypoglycemia

## 2023-01-03 NOTE — Assessment & Plan Note (Addendum)
Home medications are reviewed as collected by pharmacy.  Patient is supposedly both on propranolol and diltiazem at home.  However has not been taking propranolol frequently.   One of Patient BP recorded in the ER is 84/67 documented at 7:14 PM. Hold off on anti htn regimen tonight. Reinitiate in AM. Check routine orhtostatic in aM

## 2023-01-03 NOTE — Assessment & Plan Note (Signed)
Patient and daughter at the bedside describe chronic progressive pattern of falling.  Due to gait instability rather than focal weakness.  See HPI for further details.  At this time patient is noted to have multiple toxins in urine which are not prescribed including cannabinoids, benzodiazepines.  Further patient apparently took prior dose of opiate for pain control without prescription.  We will wean down patient polypharmacy and have physical therapy evaluate the patient.  I will check B12 as well as thyroid cascade to make sure were not dealing with any occult hormonal or vitamin issue.

## 2023-01-03 NOTE — Assessment & Plan Note (Signed)
Continue with bupropion, continue with patient home dose of Adderall which she seems to skip a few doses of occasionally.  reduce clonazepam to 0.5 mg as needed daily for anxiety from 1 mg given falls.

## 2023-01-03 NOTE — ED Provider Notes (Signed)
62 year old female history hypertension, diabetes, anxiety presenting for recurrent falls.  Patient is here with her daughter.  For last couple weeks she has had worsening dizziness and difficulty ambulating.  She is fallen several times a day.  Unsure if she hit her head.  She feels some generalized weakness but no focal weakness.  Some pain to her knees from falling but is able to stand.  She feels dizzy when she stands up, occasionally feel like her legs give out on her.  No new numbness.  She takes meclizine and gabapentin at home to help with her dizziness which she is not sure is helping.  She reports history of vertigo.  On my exam here she is awake and alert.  No cranial nerve deficits.  Full strength in bilateral upper and lower extremities and normal sensation.  No focal deficit or indication for code stroke.  She has had dizziness for couple weeks.  Some somewhat than her normal vertigo.  On my exam she is quite tender over the left lateral rib cage and left upper abdomen.  Given recurrent falls we will plan for CT imaging of this as well as CT of her head.  She may need MRI as well if this is all normal to rule out occult stroke causing her worsening dizziness and gait problems.  She is objectively normal strength in her lower extremities, no radicular pain, groin numbness low suspicion for cauda equina syndrome, Guillain-Barr syndrome.  Workup pending at end of my shift.   Laurence Spates, MD 01/04/23 1556

## 2023-01-03 NOTE — ED Provider Notes (Signed)
[place] EMERGENCY DEPARTMENT AT [facility] Provider Note   CSN: [id number] Arrival date & time: 01/03/23  1421     History  Chief Complaint  Patient presents with   Fall    Angela Burch is a 62 y.o. female.  The history is provided by the patient, a relative and medical records. No language interpreter was used.  Fall     62 year old female with significant history of hypertension, diabetes, anxiety, brought here via EMS from home with concerns of a recent fall.  History obtained both from patient and through daughter who is at bedside.  Per daughter, patient always has problem with balance and but it seems that the past 7 days she is having more trouble with bouts of dizziness and trouble ambulating.  States she fell several times within the past 2 to 3 days most falls are unwitnessed but daughter did notice that she fell next to her vanity in the bathroom today.  Daughter also noticed that patient is having trouble with her memory, as well as having trouble with her speech for at least 48 hours.  Patient is complaining of aches and pain throughout body including both knees and arms as well as head from the fall.  She does not endorse any vision changes nausea vomiting or urinary symptoms.  She denies any focal numbness or focal weakness.  She admits to alcohol use but denies alcohol abuse.  She does use tobacco products and occasionally uses gummy bear.  No history of prior stroke.  No urinary symptoms.  Home Medications Prior to Admission medications   Medication Sig Start Date End Date Taking? Authorizing Provider  amLODipine (NORVASC) 10 MG tablet Take 1 tablet (10 mg total) by mouth daily. Patient not taking: Reported on 06/15/2017 06/19/16   Loletta Specter, PA-C  amphetamine-dextroamphetamine (ADDERALL) 30 MG tablet Take 1 tablet by mouth daily. 08/02/16   Loletta Specter, PA-C  blood glucose meter kit and supplies KIT Dispense based on patient and insurance  preference. Use up to four times daily as directed. (FOR ICD-9 250.00, 250.01). 06/19/16   Loletta Specter, PA-C  buPROPion (WELLBUTRIN XL) 150 MG 24 hr tablet Take 150 mg by mouth daily. 05/13/17   [provider]  CARTIA XT 120 MG 24 hr capsule Take 120 mg by mouth daily. 05/13/17   [provider]  clonazePAM (KLONOPIN) 1 MG tablet Take 1 mg by mouth daily as needed for anxiety.  03/18/17   [provider]  glucose blood (TRUETEST TEST) test strip Use as instructed 06/21/16   Loletta Specter, PA-C  hydrOXYzine (ATARAX/VISTARIL) 25 MG tablet Take 1 tablet (25 mg total) by mouth at bedtime. Patient not taking: Reported on 06/15/2017 08/13/16   Loletta Specter, PA-C  Insulin Glargine (LANTUS SOLOSTAR) 100 UNIT/ML Solostar Pen Inject 45 Units into the skin daily at 10 pm. Patient not taking: Reported on 06/15/2017 08/27/16   Quentin Angst, MD  Insulin Pen Needle (B-D UF III MINI PEN NEEDLES) 31G X 5 MM MISC 1 application by Does not apply route daily. 08/13/16   Loletta Specter, PA-C  lisinopril (PRINIVIL,ZESTRIL) 10 MG tablet Take 1 tablet (10 mg total) by mouth daily. Patient not taking: Reported on 06/15/2017 06/19/16   Loletta Specter, PA-C  losartan (COZAAR) 100 MG tablet Take 100 mg by mouth daily. 05/13/17   [provider]  NOVOLIN N RELION 100 UNIT/ML injection Inject 20-50 Units into the skin 2 (two)  times daily. Sliding scale - depends on BG 05/15/17   [provider]  pantoprazole (PROTONIX) 20 MG tablet Take 1 tablet (20 mg total) by mouth daily. 06/15/17   Lorre Nick, MD  propranolol (INDERAL) 20 MG tablet Take 1 tablet (20 mg total) by mouth 2 (two) times daily. Patient not taking: Reported on 06/15/2017 08/13/16   Loletta Specter, PA-C  sertraline (ZOLOFT) 100 MG tablet Take 1 tablet (100 mg total) by mouth daily. Patient not taking: Reported on 06/15/2017 06/19/16   Loletta Specter, PA-C  sucralfate (CARAFATE) 1 g tablet Take 1  tablet (1 g total) by mouth 4 (four) times daily. 06/15/17   Lorre Nick, MD  triamcinolone cream (KENALOG) 0.5 % APPLY 1 APPLICATION TOPICALLY 2 TIMES DAILY. DO NOT USE FOR MORE THAN 7 CONSECUTIVE DAYS WITHOUT MEDICAL APPROVAL. 07/19/16   Loletta Specter, PA-C      Allergies    Sulfa antibiotics and Metformin and related    Review of Systems   Review of Systems  All other systems reviewed and are negative.   Physical Exam Updated Vital Signs BP (!) 148/77   Pulse 77   Temp 98.6 F (37 C) (Oral)   Resp 13   SpO2 96%  Physical Exam Vitals and nursing note reviewed.  Constitutional:      General: She is not in acute distress.    Appearance: She is well-developed.     Comments: Patient laying in bed, eyes closed, appears slightly uncomfortable but nontoxic.  HENT:     Head: Atraumatic.     Mouth/Throat:     Comments: Mouth is dry Eyes:     Extraocular Movements: Extraocular movements intact.     Conjunctiva/sclera: Conjunctivae normal.     Pupils: Pupils are equal, round, and reactive to light.     Comments: No nystagmus  Cardiovascular:     Rate and Rhythm: Normal rate and regular rhythm.     Pulses: Normal pulses.     Heart sounds: Normal heart sounds.  Pulmonary:     Effort: Pulmonary effort is normal.  Chest:     Chest wall: Tenderness (tenderness to palpation of chest wall diffusedly without bruising or crepitus noted) present.  Abdominal:     Palpations: Abdomen is soft.     Tenderness: There is no abdominal tenderness.  Musculoskeletal:     Cervical back: Normal range of motion and neck supple. No rigidity or tenderness.     Comments: Global weakness but equal strength throughout all 4 extremities  Skin:    Findings: No rash.  Neurological:     Mental Status: She is alert and oriented to person, place, and time.     Comments: Neurologic exam:  Speech is slow, pupils equal round reactive to light, extraocular movements intact  Normal peripheral visual  fields Cranial nerves III through XII normal including no facial droop Follows commands, moves all extremities x4, diminished strength but equal Sensation normal to light touch  No pronator drift Gait not tested   Psychiatric:        Mood and Affect: Mood normal.     ED Results / Procedures / Treatments   Labs (all labs ordered are listed, but only abnormal results are displayed) Labs Reviewed  BASIC METABOLIC PANEL - Abnormal; Notable for the following components:      Result Value   Glucose, Bld 118 (*)    All other components within normal limits  CBC - Abnormal; Notable for the following  components:   Hemoglobin 11.0 (*)    HCT 34.6 (*)    All other components within normal limits  URINALYSIS, ROUTINE W REFLEX MICROSCOPIC - Abnormal; Notable for the following components:   Glucose, UA >=500 (*)    All other components within normal limits  RAPID URINE DRUG SCREEN, HOSP PERFORMED - Abnormal; Notable for the following components:   Opiates POSITIVE (*)    Benzodiazepines POSITIVE (*)    Amphetamines POSITIVE (*)    Tetrahydrocannabinol POSITIVE (*)    All other components within normal limits  CBG MONITORING, ED    EKG EKG Interpretation Date/Time:  Thursday January 03 2023 15:32:20 EST Ventricular Rate:  76 PR Interval:  152 QRS Duration:  80 QT Interval:  419 QTC Calculation: 472 R Axis:   -26  Text Interpretation: Sinus rhythm Borderline left axis deviation Low voltage, precordial leads Confirmed by Fulton Reek [phone]) on 01/03/2023 3:38:40 PM  Radiology CT CHEST ABDOMEN PELVIS W CONTRAST  Result Date: 01/03/2023 CLINICAL DATA:  History of frequent falls. Flank pain. Right lower back pain. EXAM: CT CHEST, ABDOMEN, AND PELVIS WITH CONTRAST TECHNIQUE: Multidetector CT imaging of the chest, abdomen and pelvis was performed following the standard protocol during bolus administration of intravenous contrast. RADIATION DOSE REDUCTION: This exam was performed  according to the departmental dose-optimization program which includes automated exposure control, adjustment of the mA and/or kV according to patient size and/or use of iterative reconstruction technique. CONTRAST:  80mL OMNIPAQUE IOHEXOL 300 MG/ML  SOLN COMPARISON:  CT abdomen/pelvis dated June 15, 2017. FINDINGS: CT CHEST FINDINGS Cardiovascular: No significant vascular findings. Normal heart size. No pericardial effusion. Thoracic aorta is normal in caliber with mild atherosclerosis. Mediastinum/Nodes: No enlarged mediastinal, hilar, or axillary lymph nodes. Thyroid gland, trachea, and esophagus demonstrate no significant findings. Lungs/Pleura: The lungs are generally well aerated. Bibasilar left-greater-than-right linear atelectasis/scarring. Nodular opacities in the right upper lobe measuring 10 x 7 mm and 8 x 7 mm (series 6, image 61). Calcified granuloma in the right upper lobe. Bilateral lower lobe cylindrical bronchiectasis. Musculoskeletal: Mildly displaced left posterior ninth and tenth rib fractures (series 2, images 34 and 40). Minimally displaced right lateral sixth rib fracture (series 2, image 22). CT ABDOMEN PELVIS FINDINGS Hepatobiliary: No focal liver abnormality is seen. Status post cholecystectomy. No biliary dilatation. No hepatic injury or perihepatic hematoma. Pancreas: Fatty atrophy of the pancreas. No surrounding inflammatory changes. Spleen: No splenic injury or perisplenic hematoma. Adrenals/Urinary Tract: The adrenal glands are unremarkable. The kidneys enhance symmetrically. No suspicious focal lesion. No renal calculi or hydronephrosis. Vicarious excreted contrast is noted within the bilateral renal collecting systems and bladder. Bladder is otherwise unremarkable. Stomach/Bowel: Stomach is within normal limits. The visualized small bowel and colon are otherwise unremarkable. No evidence of obstruction. Vascular/Lymphatic: Aortic atherosclerosis. No enlarged abdominal or pelvic  lymph nodes. Reproductive: Uterus and bilateral adnexa are unremarkable. Other: No abdominopelvic ascites.  No free air. Musculoskeletal: Bilateral rib fractures, as described above. No additional fracture identified. IMPRESSION: 1. Mildly displaced left posterior ninth and tenth rib fractures and right lateral sixth rib fracture. 2. No acute traumatic findings in the abdomen or pelvis. 3. Nodular opacities in the right upper lobe measuring up to 10 mm, could relate to a infectious/inflammatory etiology. Non-contrast chest CT at 3-6 months is recommended. If the nodules are stable at time of repeat CT, then future CT at 18-24 months (from today's scan) is considered optional for low-risk patients, but is recommended for high-risk patients. This recommendation follows  the consensus statement: Guidelines for Management of Incidental Pulmonary Nodules Detected on CT Images: From the Fleischner Society 2017; Radiology 2017; 284:228-243. Electronically Signed   By: Hart Robinsons M.D.   On: 01/03/2023 20:36   CT L-SPINE NO CHARGE  Result Date: 01/03/2023 CLINICAL DATA:  Frequent falls over the last week. Fall today. Left flank pain. Right lower back pain. EXAM: CT LUMBAR SPINE WITHOUT CONTRAST TECHNIQUE: Multidetector CT imaging of the lumbar spine was performed without intravenous contrast administration. Multiplanar CT image reconstructions were also generated. RADIATION DOSE REDUCTION: This exam was performed according to the departmental dose-optimization program which includes automated exposure control, adjustment of the mA and/or kV according to patient size and/or use of iterative reconstruction technique. COMPARISON:  CT of the abdomen and pelvis 06/15/2017 FINDINGS: Segmentation: 5 non rib-bearing lumbar type vertebral bodies are present. The lowest fully formed vertebral body is L5. Alignment: Grade 1 anterolisthesis is present at L4-5. No other significant listhesis is present. Mild straightening of  the normal lumbar lordosis scratched at lumbar lordosis is otherwise stable. Vertebrae: Vertebral body heights are normal. No acute fractures are present. Paraspinal and other soft tissues: Atherosclerotic calcifications are present in the aorta. Paraspinous soft tissues are within normal limits. Please see dedicated CT of the abdomen and pelvis for further description of intra-abdominal organs. Disc levels: L1-2: Insert normal disc L2-3: A remote fracture or secondary ossification center is present at the superior articulating facet of the L3 vertebral body on the right. No significant stenosis is associated. L3-4: Mild disc bulging and facet hypertrophy is present without significant stenosis. L4-5: Uncovering of a broad-based disc protrusion is present. Advanced facet hypertrophy and ligamentum flavum thickening present. Vacuum phenomena are present within the facet joints bilaterally. Moderate central canal stenosis is present. Moderate foraminal narrowing is present, left greater than right. L5-S1: Asymmetric right-sided facet hypertrophy is present. No significant disc protrusion is present. Mild right foraminal narrowing is present. IMPRESSION: 1. No acute fracture or traumatic subluxation. 2. Remote fracture or secondary ossification center at the superior articulating facet of the L3 vertebral body on the right. No significant stenosis is associated. 3. Moderate central canal stenosis and moderate foraminal narrowing at L4-5, left greater than right. 4. Mild right foraminal narrowing at L5-S1. 5.  Aortic Atherosclerosis (ICD10-I70.0). Electronically Signed   By: Marin Roberts M.D.   On: 01/03/2023 17:32   CT ANGIO HEAD NECK W WO CM  Result Date: 01/03/2023 CLINICAL DATA:  Vertigo, central.  Dizziness.  Fall. EXAM: CT ANGIOGRAPHY HEAD AND NECK WITH AND WITHOUT CONTRAST TECHNIQUE: Multidetector CT imaging of the head and neck was performed using the standard protocol during bolus administration of  intravenous contrast. Multiplanar CT image reconstructions and MIPs were obtained to evaluate the vascular anatomy. Carotid stenosis measurements (when applicable) are obtained utilizing NASCET criteria, using the distal internal carotid diameter as the denominator. RADIATION DOSE REDUCTION: This exam was performed according to the departmental dose-optimization program which includes automated exposure control, adjustment of the mA and/or kV according to patient size and/or use of iterative reconstruction technique. CONTRAST:  75mL OMNIPAQUE IOHEXOL 350 MG/ML SOLN COMPARISON:  None Available. FINDINGS: CT HEAD FINDINGS Brain: No acute hemorrhage. 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 Cortical gray-white differentiation is preserved. No hydrocephalus or extra-axial collection. No mass effect or midline shift. Vascular: No hyperdense vessel or unexpected calcification. Skull: No calvarial fracture or suspicious bone lesion. Skull base is unremarkable. Sinuses/Orbits: No acute  finding. Other: None. Review of the MIP images confirms the above findings CTA NECK FINDINGS Aortic arch: Three-vessel arch configuration. Arch vessel origins are patent. Right carotid system: No evidence of dissection, stenosis (50% or greater), or occlusion. Mild mixed plaque along the right carotid bulb. Left carotid system: No evidence of dissection, stenosis (50% or greater), or occlusion. Mild mixed plaque along the proximal left cervical ICA. Vertebral arteries: Right dominant. No evidence of dissection, stenosis (50% or greater), or occlusion. Skeleton: Unremarkable. Other neck: Unremarkable. Upper chest: Unremarkable. Review of the MIP images confirms the above findings CTA HEAD FINDINGS Anterior circulation: Calcified plaque along the carotid siphons without hemodynamically significant stenosis. The proximal ACAs and MCAs are patent without stenosis or aneurysm.  Distal branches are symmetric. Posterior circulation: Normal basilar artery. The SCAs, AICAs and PICAs are patent proximally. The PCAs are patent proximally without stenosis or aneurysm. Distal branches are symmetric. Venous sinuses: As permitted by contrast timing, patent. Anatomic variants: Persistent fetal origin of the bilateral PCAs with hypoplastic P1 segments. Review of the MIP images confirms the above findings IMPRESSION: 1. No acute intracranial abnormality. 2. No large vessel occlusion, hemodynamically significant stenosis, or aneurysm in the head or neck. 3. 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 Electronically Signed   By: Orvan Falconer M.D.   On: 01/03/2023 16:58    Procedures Procedures    Medications Ordered in ED Medications  acetaminophen (TYLENOL) tablet 1,000 mg (has no administration in time range)  meclizine (ANTIVERT) tablet 25 mg (25 mg Oral Given 01/03/23 1534)  iohexol (OMNIPAQUE) 350 MG/ML injection 75 mL (75 mLs Intravenous Contrast Given 01/03/23 1632)  iohexol (OMNIPAQUE) 300 MG/ML solution 100 mL (80 mLs Intravenous Contrast Given 01/03/23 1711)    ED Course/ Medical Decision Making/ A&P                                 Medical Decision Making Amount and/or Complexity of Data Reviewed Labs: ordered. Radiology: ordered.  Risk Prescription drug management.   BP (!) 148/77   Pulse 77   Temp 98.6 F (37 C) (Oral)   Resp 13   SpO2 96%   36:64 PM  62 year old female with significant history of hypertension, diabetes, anxiety, brought here via EMS from home with concerns of a recent fall.  History obtained both from patient and through daughter who is at bedside.  Per daughter, patient always has problem with balance and but it seems that the past 7 days she is having more trouble with bouts of dizziness and trouble ambulating.  States she fell several times within the past 2 to 3 days  most falls are unwitnessed but daughter did notice that she fell next to her vanity in the bathroom today.  Daughter also noticed that patient is having trouble with her memory, as well as having trouble with her speech for at least 48 hours.  Patient is complaining of aches and pain throughout body including both knees and arms as well as head from the fall.  She does not endorse any vision changes nausea vomiting or urinary symptoms.  She denies any focal numbness or focal weakness.  She admits to alcohol use but denies alcohol abuse.  She does use tobacco products and occasionally uses gummy bear.  No history of prior stroke.  No urinary symptoms.  On exam, patient is laying in bed with her  eyes closed appears slightly uncomfortable but nontoxic.  No significant nystagmus on eye exam, normal peripheral vision and no facial droop, mouth is dry no signs of scalp trauma or spine trauma.  Heart with normal rate and rhythm, lungs are clear. abdomen soft but diffusely tender.  Patient has diminished strength to all 4 extremities but equal.  Normal grip strength.  No pronator drift.  She has some tenderness to bilateral knee with some mild erythema but able to flex and extend the knee and no crepitus or deformity noted.  She has sensation to all 4 extremities.  -Labs ordered, independently viewed and interpreted by me.  Labs remarkable for UDS showing positive opiate (pt admit she took some left over narcotic to help with pain), benzo, amphetamine and THC.  I suspect her polypharmacy likely contribute to increase risk of falls and increased confusion.  Doubt stroke.  UA without UTI -The patient was maintained on a cardiac monitor.  I personally viewed and interpreted the cardiac monitored which showed an underlying rhythm of: NSR -Imaging independently viewed and interpreted by me and I agree with radiologist's interpretation.  Result remarkable for CT of head/cspine/chest/abd/pelvis/lspine showing multiple ribs  fx.  -This patient presents to the ED for concern of multiple falls, this involves an extensive number of treatment options, and is a complaint that carries with it a high risk of complications and morbidity.  The differential diagnosis includes polypharmacy, UTI, stroke, neuropathy, vertigo -Co morbidities that complicate the patient evaluation includes vertigo, DM, HTN -Treatment includes tylenol -Reevaluation of the patient after these medicines showed that the patient improved -PCP office notes or outside notes reviewed -Discussion with specialist [facility] Dr. Bartholomew Crews who agrees to admit pt -Escalation to admission/observation considered: patient is agreeable with admission.        Final Clinical Impression(s) / ED Diagnoses Final diagnoses:  Closed fracture of multiple ribs of both sides, initial encounter  Polypharmacy  Recurrent falls    Rx / DC Orders ED Discharge Orders     None         Fayrene Helper, PA-C 01/03/23 2124    Laurence Spates, MD 01/04/23 1556

## 2023-01-03 NOTE — Assessment & Plan Note (Signed)
CAT scan above, incidental, repeat CAT scan as advised.

## 2023-01-03 NOTE — Assessment & Plan Note (Signed)
Traumatic. See ct chest. No flail chest. No hypoxia. Pain meds ordered. Incentive spirometry. Continuous pulse ox.

## 2023-01-03 NOTE — ED Notes (Signed)
Pt ambulated with assistance from this NA from room 25 to hall bed B and back to pt room. Pt ambulated with pain and this NA holding pt by arm. RN notified.

## 2023-01-03 NOTE — ED Notes (Addendum)
Called out pt. Decreased bp 84/67, RN made aware.

## 2023-01-03 NOTE — Assessment & Plan Note (Addendum)
Incidentally : Patient is noted to have focal subcortical hypoattenuation on CAT scan in the left parietal lobe with associated volume loss suggestive of chronic infarction.  I will do a limited stroke workup including maintaining patient on telemetry, echo, lipid panel.  Routine MRI. Aspirin. Smokin cessation advised.

## 2023-01-04 ENCOUNTER — Inpatient Hospital Stay (HOSPITAL_COMMUNITY): Payer: Managed Care, Other (non HMO)

## 2023-01-04 ENCOUNTER — Other Ambulatory Visit (HOSPITAL_COMMUNITY): Payer: Managed Care, Other (non HMO)

## 2023-01-04 ENCOUNTER — Encounter (HOSPITAL_COMMUNITY): Payer: Self-pay | Admitting: Internal Medicine

## 2023-01-04 DIAGNOSIS — R296 Repeated falls: Secondary | ICD-10-CM | POA: Diagnosis not present

## 2023-01-04 DIAGNOSIS — S2243XA Multiple fractures of ribs, bilateral, initial encounter for closed fracture: Secondary | ICD-10-CM

## 2023-01-04 DIAGNOSIS — W19XXXA Unspecified fall, initial encounter: Secondary | ICD-10-CM

## 2023-01-04 DIAGNOSIS — Z79899 Other long term (current) drug therapy: Secondary | ICD-10-CM | POA: Diagnosis not present

## 2023-01-04 LAB — HEPATIC FUNCTION PANEL
ALT: 67 U/L — ABNORMAL HIGH (ref 0–44)
AST: 60 U/L — ABNORMAL HIGH (ref 15–41)
Albumin: 3.8 g/dL (ref 3.5–5.0)
Alkaline Phosphatase: 127 U/L — ABNORMAL HIGH (ref 38–126)
Bilirubin, Direct: <0.1 mg/dL (ref 0.0–0.2)
Total Bilirubin: 0.8 mg/dL (ref <1.2)
Total Protein: 6.8 g/dL (ref 6.5–8.1)

## 2023-01-04 LAB — APTT: aPTT: 29 s (ref 24–36)

## 2023-01-04 LAB — LIPID PANEL
Cholesterol: 177 mg/dL (ref 0–200)
HDL: 48 mg/dL (ref >40)
LDL Cholesterol: 95 mg/dL (ref 0–99)
Total CHOL/HDL Ratio: 3.7 {ratio}
Triglycerides: 170 mg/dL — ABNORMAL HIGH (ref <150)
VLDL: 34 mg/dL (ref 0–40)

## 2023-01-04 LAB — CBC
HCT: 38.6 % (ref 36.0–46.0)
Hemoglobin: 12 g/dL (ref 12.0–15.0)
MCH: 26.6 pg (ref 26.0–34.0)
MCHC: 31.1 g/dL (ref 30.0–36.0)
MCV: 85.6 fL (ref 80.0–100.0)
Platelets: 265 10*3/uL (ref 150–400)
RBC: 4.51 MIL/uL (ref 3.87–5.11)
RDW: 14.7 % (ref 11.5–15.5)
WBC: 8.1 10*3/uL (ref 4.0–10.5)
nRBC: 0 % (ref 0.0–0.2)

## 2023-01-04 LAB — BASIC METABOLIC PANEL
Anion gap: 9 (ref 5–15)
BUN: 8 mg/dL (ref 8–23)
CO2: 25 mmol/L (ref 22–32)
Calcium: 9 mg/dL (ref 8.9–10.3)
Chloride: 100 mmol/L (ref 98–111)
Creatinine, Ser: 0.36 mg/dL — ABNORMAL LOW (ref 0.44–1.00)
GFR, Estimated: >60 mL/min (ref >60)
Glucose, Bld: 77 mg/dL (ref 70–99)
Potassium: 3.1 mmol/L — ABNORMAL LOW (ref 3.5–5.1)
Sodium: 134 mmol/L — ABNORMAL LOW (ref 135–145)

## 2023-01-04 LAB — VITAMIN B12: Vitamin B-12: 877 pg/mL (ref 180–914)

## 2023-01-04 LAB — TSH: TSH: 2.842 u[IU]/mL (ref 0.350–4.500)

## 2023-01-04 LAB — HIV ANTIBODY (ROUTINE TESTING W REFLEX): HIV Screen 4th Generation wRfx: NONREACTIVE

## 2023-01-04 LAB — GLUCOSE, CAPILLARY
Glucose-Capillary: 70 mg/dL (ref 70–99)
Glucose-Capillary: 132 mg/dL — ABNORMAL HIGH (ref 70–99)
Glucose-Capillary: 197 mg/dL — ABNORMAL HIGH (ref 70–99)
Glucose-Capillary: 221 mg/dL — ABNORMAL HIGH (ref 70–99)

## 2023-01-04 LAB — PROTIME-INR
INR: 0.9 (ref 0.8–1.2)
Prothrombin Time: 12.6 s (ref 11.4–15.2)

## 2023-01-04 MED ORDER — THIAMINE MONONITRATE 100 MG PO TABS
100.0000 mg | ORAL_TABLET | Freq: Every day | ORAL | Status: DC
  Administered 2023-01-04 – 2023-01-06 (×3): 100 mg via ORAL
  Filled 2023-01-04 (×3): qty 1

## 2023-01-04 MED ORDER — ONDANSETRON HCL 4 MG/2ML IJ SOLN
4.0000 mg | Freq: Four times a day (QID) | INTRAMUSCULAR | Status: DC | PRN
  Administered 2023-01-04 – 2023-01-06 (×3): 4 mg via INTRAVENOUS
  Filled 2023-01-04 (×3): qty 2

## 2023-01-04 MED ORDER — POTASSIUM CHLORIDE CRYS ER 20 MEQ PO TBCR
40.0000 meq | EXTENDED_RELEASE_TABLET | Freq: Once | ORAL | Status: AC
  Administered 2023-01-04: 40 meq via ORAL
  Filled 2023-01-04: qty 2

## 2023-01-04 NOTE — Progress Notes (Signed)
Progress Note   Patient: Angela Burch JXB:[id number] DOB: 1960-05-20 DOA: 01/03/2023     1 DOS: the patient was seen and examined on 01/04/2023    Brief hospital course: Zela ZAVIAH NASTRI is a 62 y.o. female with medical history significant of chronic vertigo, patient has vertiginous attacks about once every day or less frequently.  Patient takes several medications including gabapentin, meclizine as well as Zofran to abate her attacks.  And generally she is able to function well after that.  However patient describes a slowly progressive gait instability for last several months but particularly notable over the last 7 days and then especially worse over the last 72 hours.    Assessment and Plan: Rib fracture Traumatic. See ct chest. No flail chest. No hypoxia.  Continue pain meds ordered.   Continue incentive spirometry. Continuous pulse ox.   Falls Patient and daughter at the bedside describe chronic progressive pattern of falling.  Normal vitamin B12 levels PT OT consulted  CVA (cerebral vascular accident) (HCC) Incidentally : Patient is noted to have focal subcortical hypoattenuation on CAT scan in the left parietal lobe with associated volume loss suggestive of chronic infarction.  Continue aspirin and statin therapy Patient counseled on smoking cessation MRI of the brain did not show any acute intracranial pathology  Lung nodules CAT scan above, incidental, repeat CAT scan as advised.   Diabetes (HCC) Continue with home insulin regimen, add insulin sliding scale to monitor glucose and for occult hypoglycemia   Hypertension Monitor blood pressure closely Blood pressure currently soft   Anxiety Continue with bupropion, continue with patient home dose of Adderall which she seems to skip a few doses of occasionally.  reduce clonazepam to 0.5 mg as needed daily for anxiety from 1 mg given falls.     Dvt ppx will be ordred.    Advance Care Planning:   Code Status: Prior  patient wishes to Be DNR. Ok to intubate.   Consults: none at this time.   Family Communication: daughter at bedside. All questions answered.    Subjective:  Patient seen and examined at bedside this morning  Physical Exam: General: Middle-age female laying in bed having left-sided rib pain Respiratory exam: Bilateral air entry vesicular, Cardiovascular exam S1-S2 normal Abdomen all quadrants are soft nontender Extremities warm without edema Neurologically: Symmetric facies, no focal motor deficit in either extremity.  Vitals:   01/04/23 0342 01/04/23 0345 01/04/23 0734 01/04/23 1434  BP: 132/77 (!) 141/72 131/76 99/75  Pulse: 87 100 77 79  Resp: 16  18 19   Temp:   98.6 F (37 C) 98 F (36.7 C)  TempSrc:   Oral Oral  SpO2: 94% 95% 95% 96%    Data Reviewed:    Latest Ref Rng & Units 01/04/2023    5:35 AM 01/03/2023    9:51 PM 01/03/2023    3:15 PM  BMP  Glucose 70 - 99 mg/dL 77   130   BUN 8 - 23 mg/dL 8   12   Creatinine 8.65 - 1.00 mg/dL 7.84  6.96  2.95   Sodium 135 - 145 mmol/L 134   139   Potassium 3.5 - 5.1 mmol/L 3.1   3.8   Chloride 98 - 111 mmol/L 100   102   CO2 22 - 32 mmol/L 25   29   Calcium 8.9 - 10.3 mg/dL 9.0   9.3        Latest Ref Rng & Units 01/04/2023    5:35  AM 01/03/2023    9:51 PM 01/03/2023    3:15 PM  CBC  WBC 4.0 - 10.5 K/uL 8.1  9.2  10.4   Hemoglobin 12.0 - 15.0 g/dL 52.8  41.3  24.4   Hematocrit 36.0 - 46.0 % 38.6  38.3  34.6   Platelets 150 - 400 K/uL 265  283  268      Time spent: 56 minutes  Author: Loyce Dys, MD 01/04/2023 2:49 PM  For on call review [email or web address].

## 2023-01-04 NOTE — Evaluation (Signed)
Physical Therapy Evaluation Patient Details Name: Angela Burch MRN: [id number] DOB: 12-Nov-1960 Today's Date: 01/04/2023  History of Present Illness  Pt is 62 yo female admitted 11/14 after fall and down for 13 hr.  Pt with bil rib fractures.  Incidentally found to have CVA -appeared chronic.  Pt with hx including chronic vertigo (reports taking allegra-d, meclizine, zofran to control), anxiety, DM, HTN, pt reports just dx with Lupus (not in chart)  Clinical Impression  Pt admitted with above diagnosis. At baseline, pt independent and working.  She does have long hx of vertigo, no acute issues.  Today, pt painful but was able to transfer with min A from bed then CGA to ambulate short distance.  Expect that pt will progress well as pain improves.  She reports daughter will be able to provide some assist at d/c, and feels she will be able to return home. Do recommend HHPT at d/c unless significant improvement while admitted. Pt currently with functional limitations due to the deficits listed below (see PT Problem List). Pt will benefit from acute skilled PT to increase their independence and safety with mobility to allow discharge.           If plan is discharge home, recommend the following: A little help with walking and/or transfers;A little help with bathing/dressing/bathroom;Assistance with cooking/housework;Help with stairs or ramp for entrance   Can travel by private vehicle        Equipment Recommendations None recommended by PT  Recommendations for Other Services       Functional Status Assessment Patient has had a recent decline in their functional status and demonstrates the ability to make significant improvements in function in a reasonable and predictable amount of time.     Precautions / Restrictions Precautions Precautions: Fall      Mobility  Bed Mobility Overal bed mobility: Needs Assistance Bed Mobility: Supine to Sit     Supine to sit: Min assist, HOB  elevated     General bed mobility comments: Increased time but able to do with min A to lift trunk.  Unable to try log roll due to bil rib fx    Transfers Overall transfer level: Needs assistance Equipment used: Rolling walker (2 wheels) Transfers: Sit to/from Stand Sit to Stand: Contact guard assist, Supervision           General transfer comment: Stood from bed and toielt; increased time    Ambulation/Gait Ambulation/Gait assistance: Contact guard assist Gait Distance (Feet): 15 Feet (15'x2) Assistive device: Rolling walker (2 wheels) Gait Pattern/deviations: Step-to pattern, Decreased stride length Gait velocity: decreased     General Gait Details: Cautious, slow, pattern but stable without LOB; fatigued easily  Stairs            Wheelchair Mobility     Tilt Bed    Modified Rankin (Stroke Patients Only)       Balance Overall balance assessment: Needs assistance Sitting-balance support: No upper extremity supported Sitting balance-Leahy Scale: Good     Standing balance support: No upper extremity supported Standing balance-Leahy Scale: Fair Standing balance comment: RW to ambulate for comfort but performing toielting ADLs and washing hands without support                             Pertinent Vitals/Pain Pain Assessment Pain Assessment: Faces Faces Pain Scale: Hurts even more Pain Location: ribs Pain Descriptors / Indicators: Discomfort, Grimacing Pain Intervention(s): Limited activity within patient's  tolerance, Monitored during session, Premedicated before session    Home Living Family/patient expects to be discharged to:: Private residence Living Arrangements: Alone Available Help at Discharge: Family;Available PRN/intermittently (dtr) Type of Home: House Home Access: Stairs to enter   Entrance Stairs-Number of Steps: 1   Home Layout: One level Home Equipment: Agricultural consultant (2 wheels);Shower seat      Prior Function Prior  Level of Function : Driving;Working/employed;Independent/Modified Independent             Mobility Comments: ambulates without AD; only had 1 fall ADLs Comments: independent adls and iadls; 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 At least 3/5 throughout)    Cervical / Trunk Assessment Cervical / Trunk Assessment: Normal Cervical / Trunk Exceptions: Cautious movement due to rib pain  Communication      Cognition Arousal: Alert Behavior During Therapy: WFL for tasks assessed/performed Overall Cognitive Status: Within Functional Limits for tasks assessed                                          General Comments General comments (skin integrity, edema, etc.): Noting petechiae R UE that pt reports is new.  Notified RN.   In regards to vertigo, pt reports no new issues. Does not feel it caused her fall. States has had for years.  Describes as spinning and nausea that she manages with allegra, zofran, meclazine.  States has seen ENT in past .  Reports she has not been given a diagnosis for her vertigo.    Discussed likely need for increased assist at home.  Reports not able to stay with her daughter but reports daughter and son in law are working on ways to provide more assist but they work, have 2 small kids, and his elderly parents. Feels as pain improves she will be able to return home.  Educated on possibly sleeping in recliner for comfort and ease of transfers.      Exercises  Educated pt on importance of maintaining deep breathing with rib fx as able.  Provided incentive spirometer and pt able to perform to 1250 mL.     Assessment/Plan    PT Assessment Patient needs continued PT services  PT Problem List Decreased  strength;Cardiopulmonary status limiting activity;Pain;Decreased range of motion;Decreased activity tolerance;Decreased knowledge of use of DME;Decreased balance;Decreased mobility       PT Treatment Interventions DME instruction;Therapeutic exercise;Gait training;Balance training;Stair training;Functional mobility training;Patient/family education;Therapeutic activities;Modalities    PT Goals (Current goals can be found in the Care Plan section)  Acute Rehab PT Goals Patient Stated Goal: return home PT Goal Formulation: With patient Time For Goal Achievement: 01/18/23 Potential to Achieve Goals: Good    Frequency Min 1X/week     Co-evaluation               AM-PAC PT "6 Clicks" Mobility  Outcome Measure Help needed turning from your back to your side while in a flat bed without using bedrails?: A Little Help needed moving from lying on your back to sitting on the side of a flat bed without using bedrails?: A Little Help needed moving to and from a bed to a  chair (including a wheelchair)?: A Little Help needed standing up from a chair using your arms (e.g., wheelchair or bedside chair)?: A Little Help needed to walk in hospital room?: A Little Help needed climbing 3-5 steps with a railing? : A Lot 6 Click Score: 17    End of Session Equipment Utilized During Treatment: Gait belt Activity Tolerance: Patient tolerated treatment well Patient left: with call bell/phone within reach;with chair alarm set;in chair Nurse Communication: Mobility status PT Visit Diagnosis: Other abnormalities of gait and mobility (R26.89);Pain Pain - part of body:  (ribs)    Time: 8756-4332 PT Time Calculation (min) (ACUTE ONLY): 47 min   Charges:   PT Evaluation $PT Eval Low Complexity: 1 Low PT Treatments $Gait Training: 8-22 mins $Therapeutic Activity: 8-22 mins PT General Charges $$ ACUTE PT VISIT: 1 Visit         Anise Salvo, PT Acute Rehab [facility]  [phone]   Rayetta Humphrey 01/04/2023, 1:17 PM

## 2023-01-04 NOTE — TOC Transition Note (Signed)
Transition of Care [facility]) - CM/SW Discharge Note   Patient Details  Name: Angela Burch MRN: [id number] Date of Birth: [birth year]  Transition of Care [facility]) CM/SW Contact:  Otelia Santee, LCSW Phone Number: 01/04/2023, 3:01 PM   Clinical Narrative:    Met with pt to review recommendation for [facility]. PT shares she works full time and declines for [facility] to be arranged. Pt is open to OP rehab and would prefer to go to office on [employer]. Referral has been placed for OP Rehab. No further TOC needs identified.    Final next level of care: OP Rehab Barriers to Discharge: No Barriers Identified   Patient Goals and CMS Choice CMS Medicare.gov Compare Post Acute Care list provided to:: Patient Choice offered to / list presented to : Patient  Discharge Placement                         Discharge Plan and Services Additional resources added to the After Visit Summary for                  DME Arranged: N/A DME Agency: NA       HH Arranged: Patient Refused HH, Refused HH          Social Determinants of Health (SDOH) Interventions SDOH Screenings   Food Insecurity: No Food Insecurity (01/04/2023)  Housing: Low Risk  (01/04/2023)  Transportation Needs: No Transportation Needs (01/04/2023)  Utilities: Not At Risk (01/04/2023)  Tobacco Use: High Risk (01/04/2023)     Readmission Risk Interventions    01/04/2023    3:00 PM  Readmission Risk Prevention Plan  Post Dischage Appt Complete  Medication Screening Complete  Transportation Screening Complete

## 2023-01-04 NOTE — Evaluation (Signed)
Occupational Therapy Evaluation Patient Details Name: Angela Burch MRN: [id number] DOB: [birth year] Today's Date: 01/04/2023   History of Present Illness Pt is 62 yr old female admitted 01/03/23 after a fall and down for ~13 hrs.  Pt with L 9th and 10th rib fractures and R 6th rib fracture.  Pt with hx including chronic vertigo, anxiety, DM, HTN   Clinical Impression   The pt is currently presenting below her baseline level of functioning for self-care management, given the below listed deficits (see OT problem list). As such, she requires assist for tasks, including lower body dressing, toileting, and functional transfers. Today, she presented with slower, more guarded movements, due to pain associated with bilateral rib fractures. She also reported intermittent dizziness and nausea with activity. She will benefit from further OT services to maximize her independence with ADLs & to facilitate her safe return home alone. Anticipate home health OT upon discharge vs. No follow-up therapy needs, pending her functional progress during her hospital stay.        If plan is discharge home, recommend the following: Assistance with cooking/housework;Assist for transportation;A little help with bathing/dressing/bathroom    Functional Status Assessment  Patient has had a recent decline in their functional status and demonstrates the ability to make significant improvements in function in a reasonable and predictable amount of time.  Equipment Recommendations  None recommended by OT    Recommendations for Other Services       Precautions / Restrictions Precautions Precautions: Fall Restrictions Weight Bearing Restrictions: No Other Position/Activity Restrictions: rib fractures      Mobility Bed Mobility Overal bed mobility: Needs Assistance Bed Mobility: Sit to Supine       Sit to supine: Supervision, Used rails        Transfers Overall transfer level: Needs assistance Equipment  used: Rolling walker (2 wheels) Transfers: Sit to/from Stand Sit to Stand: Contact guard assist             Balance     Sitting balance-Leahy Scale: Good       Standing balance-Leahy Scale: Fair         ADL either performed or assessed with clinical judgement   ADL Overall ADL's : Needs assistance/impaired Eating/Feeding: Independent;Sitting Eating/Feeding Details (indicate cue type and reason): based on clinical judgement Grooming: Set up;Contact guard assist Grooming Details (indicate cue type and reason): Set-up seated or CGA standing at sink         Upper Body Dressing : Set up;Sitting Upper Body Dressing Details (indicate cue type and reason): simulated seated in chair Lower Body Dressing: Moderate assistance Lower Body Dressing Details (indicate cue type and reason): limited by bilateral rib pain and associated guarding of movements Toilet Transfer: Contact guard assist;Rolling walker (2 wheels);Grab bars;Ambulation Toilet Transfer Details (indicate cue type and reason): at bathroom level, based on clinical judgement                  Pertinent Vitals/Pain Pain Assessment Pain Assessment: 0-10 Pain Score: 4  Pain Location: ribs Pain Intervention(s): Limited activity within patient's tolerance, Monitored during session     Extremity/Trunk Assessment Upper Extremity Assessment Upper Extremity Assessment: Overall WFL for tasks assessed;Right hand dominant   Lower Extremity Assessment Lower Extremity Assessment: Overall WFL for tasks assessed   Cervical / Trunk Assessment Cervical / Trunk Assessment: Normal Cervical / Trunk Exceptions: Cautious movement due to rib pain   Communication Communication Communication: No apparent difficulties   Cognition Arousal: Alert Behavior During Therapy: [facility]  for tasks assessed/performed Overall Cognitive Status: Within Functional Limits for tasks assessed        General Comments: Oriented x4, able to follow  1-2 step commands                Home Living Family/patient expects to be discharged to:: Private residence Living Arrangements: Alone Available Help at Discharge: Family;Available PRN/intermittently Type of Home: House Home Access: Stairs to enter [employer] of Steps: 1   Home Layout: One level     Bathroom Shower/Tub: Chief Strategy Officer: Standard     Home Equipment: Agricultural consultant (2 wheels);Crutches;Shower seat          Prior Functioning/Environment Prior Level of Function : Driving;Working/employed;Independent/Modified Independent             Mobility Comments:  (Independent with ambulation.) ADLs Comments:  (Independent with ADLs, driving, cooking, and cleaning.)        OT Problem List: Decreased strength;Decreased activity tolerance;Impaired balance (sitting and/or standing);Pain      OT Treatment/Interventions: Self-care/ADL training;Therapeutic exercise;Energy conservation;DME and/or AE instruction;Therapeutic activities;Balance training;Patient/family education    OT Goals(Current goals can be found in the care plan section) Acute Rehab OT Goals OT Goal Formulation: With patient Time For Goal Achievement: 01/18/23 Potential to Achieve Goals: Good ADL Goals Pt Will Perform Grooming: standing;with modified independence Pt Will Perform Lower Body Dressing: with modified independence;sit to/from stand;sitting/lateral leans Pt Will Transfer to Toilet: with modified independence;ambulating Pt Will Perform Toileting - Clothing Manipulation and hygiene: with modified independence;sit to/from stand  OT Frequency: Min 1X/week       AM-PAC OT "6 Clicks" Daily Activity     Outcome Measure Help from another person eating meals?: None Help from another person taking care of personal grooming?: A Little Help from another person toileting, which includes using toliet, bedpan, or urinal?: A Little Help from another person bathing  (including washing, rinsing, drying)?: A Lot Help from another person to put on and taking off regular upper body clothing?: A Little Help from another person to put on and taking off regular lower body clothing?: A Lot 6 Click Score: 17   End of Session Equipment Utilized During Treatment: Gait belt Nurse Communication: Mobility status  Activity Tolerance: Patient limited by pain Patient left: in bed;with call bell/phone within reach;with bed alarm set  OT Visit Diagnosis: Unsteadiness on feet (R26.81);Pain                Time: 0865-7846 OT Time Calculation (min): 25 min Charges:  OT General Charges $OT Visit: 1 Visit OT Evaluation $OT Eval Moderate Complexity: 1 Mod    Alhassan Everingham L Kelan Pritt, OTR/L 01/04/2023, 2:32 PM

## 2023-01-04 NOTE — Plan of Care (Signed)
  Problem: Coping: Goal: Ability to adjust to condition or change in health will improve Outcome: Progressing   Problem: Health Behavior/Discharge Planning: Goal: Ability to identify and utilize available resources and services will improve Outcome: Progressing   Problem: Education: Goal: Knowledge of disease or condition will improve Outcome: Progressing   Problem: Ischemic Stroke/TIA Tissue Perfusion: Goal: Complications of ischemic stroke/TIA will be minimized Outcome: Progressing   Problem: Nutrition: Goal: Risk of aspiration will decrease Outcome: Progressing   Problem: Education: Goal: Knowledge of General Education information will improve Description: Including pain rating scale, medication(s)/side effects and non-pharmacologic comfort measures Outcome: Progressing   Problem: Clinical Measurements: Goal: Ability to maintain clinical measurements within normal limits will improve Outcome: Progressing Goal: Will remain free from infection Outcome: Progressing   Problem: Coping: Goal: Level of anxiety will decrease Outcome: Progressing

## 2023-01-04 NOTE — Inpatient Diabetes Management (Signed)
Inpatient Diabetes Program Recommendations  AACE/ADA: New Consensus Statement on Inpatient Glycemic Control (2015)  Target Ranges:  Prepandial:   less than 140 mg/dL      Peak postprandial:   less than 180 mg/dL (1-2 hours)      Critically ill patients:  140 - 180 mg/dL   Lab Results  Component Value Date   GLUCAP 132 (H) 01/04/2023   HGBA1C 9.9 (H) 01/03/2023    Review of Glycemic Control  Latest Reference Range & Units 01/03/23 15:38 01/03/23 21:50 01/04/23 07:39 01/04/23 12:19  Glucose-Capillary 70 - 99 mg/dL 94 86 70 478 (H)   Diabetes history: DM 2 Outpatient Diabetes medications: Tresiba 40 units, Jardiance 10 mg Daily Current orders for Inpatient glycemic control:  Semglee 40 units Daily Jardiance 10 mg Daily Novolog 0-15 units tid + hs  A1c 9.9% on 11/14  Spoke with pt at bedside regarding her A1c level of 9.96% on 11/14. Pt reports seeing her PCP every month and that her current A1c has been around the same level at her last follow up. Pt reports cheating a lot when it comes to food and she also has had many stressors  with being recently diagnosed with lupus and then her brother unexpectedly passing away. Encouraged glucose control to reduce potential complications with hyperglycemia.  Note: Glucose trends on the lower side, Evaristo Bury has a very long half life. Will watch glucose trends while here.  Thanks,  Christena Deem RN, MSN, BC-ADM Inpatient Diabetes Coordinator Team Pager [phone] (8a-5p)

## 2023-01-05 ENCOUNTER — Inpatient Hospital Stay (HOSPITAL_COMMUNITY): Payer: Managed Care, Other (non HMO)

## 2023-01-05 DIAGNOSIS — I6389 Other cerebral infarction: Secondary | ICD-10-CM | POA: Diagnosis not present

## 2023-01-05 DIAGNOSIS — S2243XA Multiple fractures of ribs, bilateral, initial encounter for closed fracture: Secondary | ICD-10-CM | POA: Diagnosis not present

## 2023-01-05 DIAGNOSIS — W19XXXA Unspecified fall, initial encounter: Secondary | ICD-10-CM | POA: Diagnosis not present

## 2023-01-05 DIAGNOSIS — R296 Repeated falls: Secondary | ICD-10-CM | POA: Diagnosis not present

## 2023-01-05 DIAGNOSIS — Z79899 Other long term (current) drug therapy: Secondary | ICD-10-CM | POA: Diagnosis not present

## 2023-01-05 LAB — BASIC METABOLIC PANEL
Anion gap: 9 (ref 5–15)
BUN: 12 mg/dL (ref 8–23)
CO2: 25 mmol/L (ref 22–32)
Calcium: 8.8 mg/dL — ABNORMAL LOW (ref 8.9–10.3)
Chloride: 98 mmol/L (ref 98–111)
Creatinine, Ser: 0.48 mg/dL (ref 0.44–1.00)
GFR, Estimated: >60 mL/min (ref >60)
Glucose, Bld: 113 mg/dL — ABNORMAL HIGH (ref 70–99)
Potassium: 3.8 mmol/L (ref 3.5–5.1)
Sodium: 132 mmol/L — ABNORMAL LOW (ref 135–145)

## 2023-01-05 LAB — GLUCOSE, CAPILLARY
Glucose-Capillary: 97 mg/dL (ref 70–99)
Glucose-Capillary: 149 mg/dL — ABNORMAL HIGH (ref 70–99)
Glucose-Capillary: 125 mg/dL — ABNORMAL HIGH (ref 70–99)
Glucose-Capillary: 75 mg/dL (ref 70–99)
Glucose-Capillary: 189 mg/dL — ABNORMAL HIGH (ref 70–99)

## 2023-01-05 LAB — ECHOCARDIOGRAM COMPLETE
AR max vel: 1.57 cm2
AV Area VTI: 1.88 cm2
AV Area mean vel: 1.59 cm2
AV Mean grad: 3.5 mm[Hg]
AV Peak grad: 7.1 mm[Hg]
Ao pk vel: 1.33 m/s
Area-P 1/2: 3.63 cm2
S' Lateral: 2.6 cm

## 2023-01-05 LAB — CBC
HCT: 38.2 % (ref 36.0–46.0)
Hemoglobin: 11.8 g/dL — ABNORMAL LOW (ref 12.0–15.0)
MCH: 26.5 pg (ref 26.0–34.0)
MCHC: 30.9 g/dL (ref 30.0–36.0)
MCV: 85.7 fL (ref 80.0–100.0)
Platelets: 274 10*3/uL (ref 150–400)
RBC: 4.46 MIL/uL (ref 3.87–5.11)
RDW: 14.5 % (ref 11.5–15.5)
WBC: 8.8 10*3/uL (ref 4.0–10.5)
nRBC: 0 % (ref 0.0–0.2)

## 2023-01-05 MED ORDER — ALUM & MAG HYDROXIDE-SIMETH 200-200-20 MG/5ML PO SUSP
30.0000 mL | ORAL | Status: DC | PRN
  Administered 2023-01-05: 30 mL via ORAL
  Filled 2023-01-05: qty 30

## 2023-01-05 MED ORDER — PERFLUTREN LIPID MICROSPHERE
1.0000 mL | INTRAVENOUS | Status: AC | PRN
Start: 2023-01-05 — End: 2023-01-05
  Administered 2023-01-05: 3 mL via INTRAVENOUS

## 2023-01-05 NOTE — Progress Notes (Signed)
  Echocardiogram 2D Echocardiogram has been performed.  Angela Burch 01/05/2023, 9:32 AM

## 2023-01-05 NOTE — Progress Notes (Signed)
Mobility Specialist - Progress Note   01/05/23 1311  Mobility  Activity Ambulated with assistance in hallway  Level of Assistance Modified independent, requires aide device or extra time  Assistive Device Front wheel walker  Distance Ambulated (ft) 120 ft  Activity Response Tolerated well  Mobility Referral Yes  $Mobility charge 1 Mobility  Mobility Specialist Start Time (ACUTE ONLY) 1246  Mobility Specialist Stop Time (ACUTE ONLY) 1304  Mobility Specialist Time Calculation (min) (ACUTE ONLY) 18 min   Pt received in bed and agreeable to mobility. No complaints during session. Pt to recliner after session with all needs met. Chair alarm on.   [facility]

## 2023-01-05 NOTE — Progress Notes (Signed)
Progress Note   Patient: Angela Burch QMV:[id number] DOB: 10/26/1960 DOA: 01/03/2023     2 DOS: the patient was seen and examined on 01/05/2023   Subjective:  Patient seen and examined at bedside this morning Denies nausea vomiting abdominal pain chest pain or cough Still having left-sided rib pain but improving  Brief hospital course: Gwendloyn KAYLA BETTNER is a 62 y.o. female with medical history significant of chronic vertigo, patient has vertiginous attacks about once every day or less frequently.  Patient takes several medications including gabapentin, meclizine as well as Zofran to abate her attacks.  And generally she is able to function well after that.  However patient describes a slowly progressive gait instability for last several months but particularly notable over the last 7 days and then especially worse over the last 72 hours.      Assessment and Plan: Rib fracture Traumatic. See ct chest. No flail chest. No hypoxia.  Continue pain meds ordered.   Continue incentive spirometer   Mechanical fall Patient and daughter at the bedside describe chronic progressive pattern of falling.  Normal vitamin B12 levels Continue PT OT PT has recommended discharge home with home health when medically stable   CVA (cerebral vascular accident) (HCC) Incidentally : Patient is noted to have focal subcortical hypoattenuation on CAT scan in the left parietal lobe with associated volume loss suggestive of chronic infarction.  Continue aspirin and statin therapy Patient counseled on smoking cessation MRI of the brain did not show any acute intracranial pathology Echo showed EF of 65 to 70% with grade 1 diastolic dysfunction   Lung nodules Outpatient follow-up   Diabetes (HCC) Continue with home insulin regimen, add insulin sliding scale to monitor glucose and for occult hypoglycemia   Hypertension Monitor blood pressure closely Blood pressure currently soft   Anxiety Continue with  bupropion, continue with patient home dose of Adderall which she seems to skip a few doses of occasionally.  reduce clonazepam to 0.5 mg as needed daily for anxiety from 1 mg given falls.     Dvt ppx will be ordred.    Advance Care Planning:   Code Status: Prior patient wishes to Be DNR. Ok to intubate.   Consults: none at this time.   Family Communication: daughter at bedside. All questions answered.    Physical Exam: General: Middle-age female laying in bed having left-sided rib pain Respiratory exam: Bilateral air entry vesicular, Cardiovascular exam S1-S2 normal Abdomen all quadrants are soft nontender Extremities warm without edema Neurologically: Symmetric facies, no focal motor deficit in either extremity.  Data Reviewed:     Latest Ref Rng & Units 01/05/2023    7:08 AM 01/04/2023    5:35 AM 01/03/2023    9:51 PM  BMP  Glucose 70 - 99 mg/dL 284  77    BUN 8 - 23 mg/dL 12  8    Creatinine 1.32 - 1.00 mg/dL 4.40  1.02  7.25   Sodium 135 - 145 mmol/L 132  134    Potassium 3.5 - 5.1 mmol/L 3.8  3.1    Chloride 98 - 111 mmol/L 98  100    CO2 22 - 32 mmol/L 25  25    Calcium 8.9 - 10.3 mg/dL 8.8  9.0         Latest Ref Rng & Units 01/05/2023    7:08 AM 01/04/2023    5:35 AM 01/03/2023    9:51 PM  CBC  WBC 4.0 - 10.5 K/uL 8.8  8.1  9.2   Hemoglobin 12.0 - 15.0 g/dL 11.9  14.7  82.9   Hematocrit 36.0 - 46.0 % 38.2  38.6  38.3   Platelets 150 - 400 K/uL 274  265  283     Vitals:   01/04/23 1434 01/04/23 2006 01/05/23 0450 01/05/23 0900  BP: 99/75 138/69 (!) 160/74 (!) 155/79  Pulse: 79 87 80 90  Resp: 19 18 18 17   Temp: 98 F (36.7 C) 98.5 F (36.9 C) 98.2 F (36.8 C) 98.3 F (36.8 C)  TempSrc: Oral Oral Oral Oral  SpO2: 96% 96% 94% 92%    Author: Loyce Dys, MD 01/05/2023 1:51 PM  For on call review [email or web address].

## 2023-01-05 NOTE — Plan of Care (Signed)
  Problem: Health Behavior/Discharge Planning: Goal: Ability to identify and utilize available resources and services will improve 01/05/2023 1939 by Juleen Starr, RN Outcome: Progressing 01/05/2023 1923 by Juleen Starr, RN Outcome: Progressing   Problem: Nutritional: Goal: Maintenance of adequate nutrition will improve 01/05/2023 1939 by Juleen Starr, RN Outcome: Progressing 01/05/2023 1923 by Juleen Starr, RN Outcome: Progressing   Problem: Coping: Goal: Will verbalize positive feelings about self 01/05/2023 1939 by Juleen Starr, RN Outcome: Progressing 01/05/2023 1923 by Juleen Starr, RN Outcome: Progressing   Problem: Nutrition: Goal: Risk of aspiration will decrease 01/05/2023 1939 by Juleen Starr, RN Outcome: Progressing 01/05/2023 1923 by Juleen Starr, RN Outcome: Progressing   Problem: Education: Goal: Knowledge of General Education information will improve Description: Including pain rating scale, medication(s)/side effects and non-pharmacologic comfort measures 01/05/2023 1939 by Juleen Starr, RN Outcome: Progressing 01/05/2023 1923 by Juleen Starr, RN Outcome: Progressing   Problem: Activity: Goal: Risk for activity intolerance will decrease 01/05/2023 1939 by Juleen Starr, RN Outcome: Progressing 01/05/2023 1923 by Juleen Starr, RN Outcome: Progressing

## 2023-01-05 NOTE — Plan of Care (Signed)

## 2023-01-05 NOTE — Plan of Care (Signed)
  Problem: Coping: Goal: Ability to adjust to condition or change in health will improve Outcome: Progressing   Problem: Health Behavior/Discharge Planning: Goal: Ability to identify and utilize available resources and services will improve Outcome: Progressing   Problem: Metabolic: Goal: Ability to maintain appropriate glucose levels will improve Outcome: Progressing   Problem: Ischemic Stroke/TIA Tissue Perfusion: Goal: Complications of ischemic stroke/TIA will be minimized Outcome: Progressing   Problem: Nutrition: Goal: Risk of aspiration will decrease Outcome: Progressing   Problem: Education: Goal: Knowledge of General Education information will improve Description: Including pain rating scale, medication(s)/side effects and non-pharmacologic comfort measures Outcome: Progressing   Problem: Clinical Measurements: Goal: Ability to maintain clinical measurements within normal limits will improve Outcome: Progressing

## 2023-01-06 ENCOUNTER — Telehealth: Payer: Self-pay | Admitting: Internal Medicine

## 2023-01-06 DIAGNOSIS — S2249XA Multiple fractures of ribs, unspecified side, initial encounter for closed fracture: Secondary | ICD-10-CM

## 2023-01-06 DIAGNOSIS — R918 Other nonspecific abnormal finding of lung field: Secondary | ICD-10-CM

## 2023-01-06 DIAGNOSIS — Z794 Long term (current) use of insulin: Secondary | ICD-10-CM

## 2023-01-06 DIAGNOSIS — E1142 Type 2 diabetes mellitus with diabetic polyneuropathy: Secondary | ICD-10-CM

## 2023-01-06 DIAGNOSIS — E782 Mixed hyperlipidemia: Secondary | ICD-10-CM

## 2023-01-06 DIAGNOSIS — I10 Essential (primary) hypertension: Secondary | ICD-10-CM

## 2023-01-06 DIAGNOSIS — K219 Gastro-esophageal reflux disease without esophagitis: Secondary | ICD-10-CM

## 2023-01-06 DIAGNOSIS — E1169 Type 2 diabetes mellitus with other specified complication: Secondary | ICD-10-CM

## 2023-01-06 DIAGNOSIS — Y92009 Unspecified place in unspecified non-institutional (private) residence as the place of occurrence of the external cause: Secondary | ICD-10-CM

## 2023-01-06 DIAGNOSIS — W19XXXA Unspecified fall, initial encounter: Secondary | ICD-10-CM | POA: Diagnosis not present

## 2023-01-06 LAB — CBC
HCT: 39.8 % (ref 36.0–46.0)
Hemoglobin: 12.3 g/dL (ref 12.0–15.0)
MCH: 26.4 pg (ref 26.0–34.0)
MCHC: 30.9 g/dL (ref 30.0–36.0)
MCV: 85.4 fL (ref 80.0–100.0)
Platelets: 266 10*3/uL (ref 150–400)
RBC: 4.66 MIL/uL (ref 3.87–5.11)
RDW: 14.3 % (ref 11.5–15.5)
WBC: 8.3 10*3/uL (ref 4.0–10.5)
nRBC: 0 % (ref 0.0–0.2)

## 2023-01-06 LAB — BASIC METABOLIC PANEL
Anion gap: 10 (ref 5–15)
BUN: 11 mg/dL (ref 8–23)
CO2: 26 mmol/L (ref 22–32)
Calcium: 9.4 mg/dL (ref 8.9–10.3)
Chloride: 100 mmol/L (ref 98–111)
Creatinine, Ser: 0.43 mg/dL — ABNORMAL LOW (ref 0.44–1.00)
GFR, Estimated: >60 mL/min (ref >60)
Glucose, Bld: 107 mg/dL — ABNORMAL HIGH (ref 70–99)
Potassium: 3.8 mmol/L (ref 3.5–5.1)
Sodium: 136 mmol/L (ref 135–145)

## 2023-01-06 LAB — GLUCOSE, CAPILLARY
Glucose-Capillary: 120 mg/dL — ABNORMAL HIGH (ref 70–99)
Glucose-Capillary: 148 mg/dL — ABNORMAL HIGH (ref 70–99)

## 2023-01-06 MED ORDER — OXYCODONE HCL 5 MG PO TABS: 5.0000 mg | ORAL_TABLET | ORAL | 0 refills | Status: DC | PRN

## 2023-01-06 MED ORDER — ACETAMINOPHEN 325 MG PO TABS: 650.0000 mg | ORAL_TABLET | Freq: Two times a day (BID) | ORAL | Status: DC

## 2023-01-06 NOTE — Progress Notes (Signed)
PT VSS, PIV removed and discharge teaching completed. PT awaiting arrival of daughter and then will be wheeled downstairs via wheelchair. PT has received all valuables and belongings.

## 2023-01-06 NOTE — Discharge Summary (Incomplete)
Physician Discharge Summary   Patient: Angela Burch MRN: [id number] DOB: 1960/05/08  Admit date:     01/03/2023  Discharge date: {dischdate:26783}  Discharge Physician: Marinda Elk   PCP: Loletta Specter, PA-C   Recommendations at discharge:  {Tip this will not be part of the note when signed- Example include specific recommendations for outpatient follow-up, pending tests to follow-up on. (Optional):26781}  ***  Discharge Diagnoses: Principal Problem:   Fall Active Problems:   Anxiety   Hypertension   Diabetes (HCC)   Lung nodules   CVA (cerebral vascular accident) (HCC)   Falls   Rib fracture  Resolved Problems:   * No resolved hospital problems. *   Hospital Course: 62 y.o. female with medical history significant of hypertension, diabetes mellitus, depression , Gastroesophageal reflux disease, hyperlipidemia chronic vertigo with daily vertiginous attacks who presents with several months of unsteady gait and several days of frequent falls, most recent fall resulting in injury to her chest.  Upon evaluation in the emergency department CT imaging of the chest revealed mildly displaced ninth and 10th rib fractures as well as a right lateral sixth rib fracture.  Urine toxicology screen was found to be positive for benzodiazepines and cannabinoids.  CT imaging initially was found to have a focal subcortical hypoattenuation.  Hospitalist group was then called to assess the patient for admission to the hospital.  Patient underwent a thorough evaluation for causes of her frequent falls.  MRI brain revealed no evidence of acute intracranial pathology.  Vitamin B12 was found to be.  Thorough evaluation there is concern that polypharmacy may be contributing to patient's symptoms as of late.  PT evaluated the patient felt that the patient would benefit from skilled services with home health physical therapy.    {Tip this will not be part of the note when signed Body mass  index is 30.96 kg/m. , ,  (Optional):26781}  {(NOTE) Pain control PDMP Statment (Optional):26782}  Consultants: *** Procedures performed: ***  Disposition: {Plan; Disposition:26390} Diet recommendation:  {Diet_Plan:26776}  DISCHARGE MEDICATION: Allergies as of 01/06/2023       Reactions   Sulfa Antibiotics Anaphylaxis, Hives   Fluoxetine    Metformin And Related      Med Rec must be completed prior to using this [facility]***        Discharge Exam: Filed Weights   01/05/23 2105  Weight: 71.9 kg   *** Constitutional: Awake alert and oriented x3, no associated distress.   Respiratory: clear to auscultation bilaterally, no wheezing, no crackles. Normal respiratory effort. No accessory muscle use.  Cardiovascular: Regular rate and rhythm, no murmurs / rubs / gallops. No extremity edema. 2+ pedal pulses. No carotid bruits.  Abdomen: Abdomen is soft and nontender.  No evidence of intra-abdominal masses.  Positive bowel sounds noted in all quadrants.   Musculoskeletal: No joint deformity upper and lower extremities. Good ROM, no contractures. Normal muscle tone.     Condition at discharge: {DC Condition:26389}  The results of significant diagnostics from this hospitalization (including imaging, microbiology, ancillary and laboratory) are listed below for reference.   Imaging Studies: ECHOCARDIOGRAM COMPLETE  Result Date: 01/05/2023    ECHOCARDIOGRAM REPORT   Patient Name:   Angela Burch Date of Exam: 01/05/2023 Medical Rec #:  [id number]       Height:       60.0 in Accession #:    [id number]      Weight:       171.0 lb Date of  Birth:  November 17, 1960      BSA:          1.746 m Patient Age:    61 years        BP:           160/74 mmHg Patient Gender: F               HR:           82 bpm. Exam Location:  Inpatient Procedure: 2D Echo, Cardiac Doppler, Color Doppler and Intracardiac            Opacification Agent Indications:    Stroke  History:        Patient has prior history of  Echocardiogram examinations and                 Patient has no prior history of Echocardiogram examinations.                 Risk Factors:Hypertension and Diabetes.  Sonographer:    Karma Ganja Referring Phys: [id number] [facility] GOEL  Sonographer Comments: Technically difficult study due to poor echo windows. IMPRESSIONS  1. Left ventricular ejection fraction, by estimation, is 65 to 70%. The left ventricle has normal function. The left ventricle has no regional wall motion abnormalities. There is mild left ventricular hypertrophy. Left ventricular diastolic parameters are consistent with Grade I diastolic dysfunction (impaired relaxation).  2. Right ventricular systolic function is normal. The right ventricular size is normal. There is normal pulmonary artery systolic pressure. The estimated right ventricular systolic pressure is 25.1 mmHg.  3. The mitral valve is normal in structure. No evidence of mitral valve regurgitation. No evidence of mitral stenosis.  4. The aortic valve is tricuspid. Aortic valve regurgitation is not visualized. No aortic stenosis is present.  5. The inferior vena cava is normal in size with greater than 50% respiratory variability, suggesting right atrial pressure of 3 mmHg. FINDINGS  Left Ventricle: Left ventricular ejection fraction, by estimation, is 65 to 70%. The left ventricle has normal function. The left ventricle has no regional wall motion abnormalities. Definity contrast agent was given IV to delineate the left ventricular  endocardial borders. The left ventricular internal cavity size was normal in size. There is mild left ventricular hypertrophy. Left ventricular diastolic parameters are consistent with Grade I diastolic dysfunction (impaired relaxation). Right Ventricle: The right ventricular size is normal. Right vetricular wall thickness was not well visualized. Right ventricular systolic function is normal. There is normal pulmonary artery systolic pressure. The tricuspid  regurgitant velocity is 2.35 m/s, and with an assumed right atrial pressure of 3 mmHg, the estimated right ventricular systolic pressure is 25.1 mmHg. Left Atrium: Left atrial size was normal in size. Right Atrium: Right atrial size was normal in size. Pericardium: There is no evidence of pericardial effusion. Presence of epicardial fat layer. Mitral Valve: The mitral valve is normal in structure. No evidence of mitral valve regurgitation. No evidence of mitral valve stenosis. Tricuspid Valve: The tricuspid valve is normal in structure. Tricuspid valve regurgitation is not demonstrated. No evidence of tricuspid stenosis. Aortic Valve: The aortic valve is tricuspid. Aortic valve regurgitation is not visualized. No aortic stenosis is present. Aortic valve mean gradient measures 3.5 mmHg. Aortic valve peak gradient measures 7.1 mmHg. Aortic valve area, by VTI measures 1.88 cm. Pulmonic Valve: The pulmonic valve was normal in structure. Pulmonic valve regurgitation is not visualized. No evidence of pulmonic stenosis. Aorta: The aortic root is normal in size and  structure and the ascending aorta was not well visualized. Venous: The inferior vena cava is normal in size with greater than 50% respiratory variability, suggesting right atrial pressure of 3 mmHg. IAS/Shunts: The atrial septum is grossly normal.  LEFT VENTRICLE PLAX 2D LVIDd:         4.10 cm   Diastology LVIDs:         2.60 cm   LV e' medial:    6.20 cm/s LV PW:         0.90 cm   LV E/e' medial:  11.0 LV IVS:        1.00 cm   LV e' lateral:   7.94 cm/s LVOT diam:     1.80 cm   LV E/e' lateral: 8.6 LV SV:         46 LV SV Index:   26 LVOT Area:     2.54 cm  IVC IVC diam: 1.70 cm LEFT ATRIUM             Index LA diam:        3.40 cm 1.95 cm/m LA Vol (A2C):   49.3 ml 28.23 ml/m LA Vol (A4C):   35.8 ml 20.50 ml/m LA Biplane Vol: 44.7 ml 25.60 ml/m  AORTIC VALVE AV Area (Vmax):    1.57 cm AV Area (Vmean):   1.59 cm AV Area (VTI):     1.88 cm AV Vmax:            133.00 cm/s AV Vmean:          89.600 cm/s AV VTI:            0.242 m AV Peak Grad:      7.1 mmHg AV Mean Grad:      3.5 mmHg LVOT Vmax:         82.30 cm/s LVOT Vmean:        56.100 cm/s LVOT VTI:          0.179 m LVOT/AV VTI ratio: 0.74  AORTA Ao Root diam: 2.70 cm Ao Asc diam:  2.70 cm MITRAL VALVE                TRICUSPID VALVE MV Area (PHT): 3.63 cm     TR Peak grad:   22.1 mmHg MV Decel Time: 209 msec     TR Vmax:        235.00 cm/s MV E velocity: 68.10 cm/s MV A velocity: 111.00 cm/s  SHUNTS MV E/A ratio:  0.61         Systemic VTI:  0.18 m                             Systemic Diam: 1.80 cm Weston Brass MD Electronically signed by Weston Brass MD Signature Date/Time: 01/05/2023/11:56:45 AM    Final    MR BRAIN WO CONTRAST  Result Date: 01/04/2023 CLINICAL DATA:  Initial evaluation for suspected stroke, dizziness. EXAM: MRI HEAD WITHOUT CONTRAST TECHNIQUE: Multiplanar, multiecho pulse sequences of the brain and surrounding structures were obtained without intravenous contrast. COMPARISON:  Prior CT from 01/03/2023. FINDINGS: Brain: Cerebral and within normal limits. Patchy T2/FLAIR hyperintensity involving the periventricular deep white matter both cerebral hemispheres, most pronounced at the subcortical left parietal lobe, most likely related chronic microvascular ischemic disease. Changes are mild for age. No abnormal foci of restricted diffusion to suggest acute or subacute ischemia. Gray-white matter differentiation maintained. No areas of chronic cortical infarction.  No acute or chronic intracranial blood products. No mass lesion, midline shift or mass effect. No hydrocephalus or extra-axial fluid collection. Pituitary gland and suprasellar region within normal limits. Vascular: Major intracranial vascular flow voids are maintained. Skull and upper cervical spine: Craniocervical junction within normal limits. Bone marrow signal intensity normal. Focus of susceptibility artifact emanating  from the left parietal scalp noted. Scalp soft tissues otherwise unremarkable. Sinuses/Orbits: Globes orbital soft tissues within normal limits. Mild scattered mucosal thickening present about the sphenoid ethmoidal sinuses. Paranasal sinuses are otherwise clear. No significant mastoid effusion. Other: None. IMPRESSION: 1. No acute intracranial abnormality. 2. Mild chronic microvascular ischemic disease for age. Electronically Signed   By: Rise Mu M.D.   On: 01/04/2023 02:14   CT CHEST ABDOMEN PELVIS W CONTRAST  Result Date: 01/03/2023 CLINICAL DATA:  History of frequent falls. Flank pain. Right lower back pain. EXAM: CT CHEST, ABDOMEN, AND PELVIS WITH CONTRAST TECHNIQUE: Multidetector CT imaging of the chest, abdomen and pelvis was performed following the standard protocol during bolus administration of intravenous contrast. RADIATION DOSE REDUCTION: This exam was performed according to the departmental dose-optimization program which includes automated exposure control, adjustment of the mA and/or kV according to patient size and/or use of iterative reconstruction technique. CONTRAST:  80mL OMNIPAQUE IOHEXOL 300 MG/ML  SOLN COMPARISON:  CT abdomen/pelvis dated June 15, 2017. FINDINGS: CT CHEST FINDINGS Cardiovascular: No significant vascular findings. Normal heart size. No pericardial effusion. Thoracic aorta is normal in caliber with mild atherosclerosis. Mediastinum/Nodes: No enlarged mediastinal, hilar, or axillary lymph nodes. Thyroid gland, trachea, and esophagus demonstrate no significant findings. Lungs/Pleura: The lungs are generally well aerated. Bibasilar left-greater-than-right linear atelectasis/scarring. Nodular opacities in the right upper lobe measuring 10 x 7 mm and 8 x 7 mm (series 6, image 61). Calcified granuloma in the right upper lobe. Bilateral lower lobe cylindrical bronchiectasis. Musculoskeletal: Mildly displaced left posterior ninth and tenth rib fractures (series 2,  images 34 and 40). Minimally displaced right lateral sixth rib fracture (series 2, image 22). CT ABDOMEN PELVIS FINDINGS Hepatobiliary: No focal liver abnormality is seen. Status post cholecystectomy. No biliary dilatation. No hepatic injury or perihepatic hematoma. Pancreas: Fatty atrophy of the pancreas. No surrounding inflammatory changes. Spleen: No splenic injury or perisplenic hematoma. Adrenals/Urinary Tract: The adrenal glands are unremarkable. The kidneys enhance symmetrically. No suspicious focal lesion. No renal calculi or hydronephrosis. Vicarious excreted contrast is noted within the bilateral renal collecting systems and bladder. Bladder is otherwise unremarkable. Stomach/Bowel: Stomach is within normal limits. The visualized small bowel and colon are otherwise unremarkable. No evidence of obstruction. Vascular/Lymphatic: Aortic atherosclerosis. No enlarged abdominal or pelvic lymph nodes. Reproductive: Uterus and bilateral adnexa are unremarkable. Other: No abdominopelvic ascites.  No free air. Musculoskeletal: Bilateral rib fractures, as described above. No additional fracture identified. IMPRESSION: 1. Mildly displaced left posterior ninth and tenth rib fractures and right lateral sixth rib fracture. 2. No acute traumatic findings in the abdomen or pelvis. 3. Nodular opacities in the right upper lobe measuring up to 10 mm, could relate to a infectious/inflammatory etiology. Non-contrast chest CT at 3-6 months is recommended. If the nodules are stable at time of repeat CT, then future CT at 18-24 months (from today's scan) is considered optional for low-risk patients, but is recommended for high-risk patients. This recommendation follows the consensus statement: Guidelines for Management of Incidental Pulmonary Nodules Detected on CT Images: From the Fleischner Society 2017; Radiology 2017; 284:228-243. Electronically Signed   By: Hart Robinsons M.D.   On:  01/03/2023 20:36   CT L-SPINE NO  CHARGE  Result Date: 01/03/2023 CLINICAL DATA:  Frequent falls over the last week. Fall today. Left flank pain. Right lower back pain. EXAM: CT LUMBAR SPINE WITHOUT CONTRAST TECHNIQUE: Multidetector CT imaging of the lumbar spine was performed without intravenous contrast administration. Multiplanar CT image reconstructions were also generated. RADIATION DOSE REDUCTION: This exam was performed according to the departmental dose-optimization program which includes automated exposure control, adjustment of the mA and/or kV according to patient size and/or use of iterative reconstruction technique. COMPARISON:  CT of the abdomen and pelvis 06/15/2017 FINDINGS: Segmentation: 5 non rib-bearing lumbar type vertebral bodies are present. The lowest fully formed vertebral body is L5. Alignment: Grade 1 anterolisthesis is present at L4-5. No other significant listhesis is present. Mild straightening of the normal lumbar lordosis scratched at lumbar lordosis is otherwise stable. Vertebrae: Vertebral body heights are normal. No acute fractures are present. Paraspinal and other soft tissues: Atherosclerotic calcifications are present in the aorta. Paraspinous soft tissues are within normal limits. Please see dedicated CT of the abdomen and pelvis for further description of intra-abdominal organs. Disc levels: L1-2: Insert normal disc L2-3: A remote fracture or secondary ossification center is present at the superior articulating facet of the L3 vertebral body on the right. No significant stenosis is associated. L3-4: Mild disc bulging and facet hypertrophy is present without significant stenosis. L4-5: Uncovering of a broad-based disc protrusion is present. Advanced facet hypertrophy and ligamentum flavum thickening present. Vacuum phenomena are present within the facet joints bilaterally. Moderate central canal stenosis is present. Moderate foraminal narrowing is present, left greater than right. L5-S1: Asymmetric  right-sided facet hypertrophy is present. No significant disc protrusion is present. Mild right foraminal narrowing is present. IMPRESSION: 1. No acute fracture or traumatic subluxation. 2. Remote fracture or secondary ossification center at the superior articulating facet of the L3 vertebral body on the right. No significant stenosis is associated. 3. Moderate central canal stenosis and moderate foraminal narrowing at L4-5, left greater than right. 4. Mild right foraminal narrowing at L5-S1. 5.  Aortic Atherosclerosis (ICD10-I70.0). Electronically Signed   By: Marin Roberts M.D.   On: 01/03/2023 17:32   CT ANGIO HEAD NECK W WO CM  Result Date: 01/03/2023 CLINICAL DATA:  Vertigo, central.  Dizziness.  Fall. EXAM: CT ANGIOGRAPHY HEAD AND NECK WITH AND WITHOUT CONTRAST TECHNIQUE: Multidetector CT imaging of the head and neck was performed using the standard protocol during bolus administration of intravenous contrast. Multiplanar CT image reconstructions and MIPs were obtained to evaluate the vascular anatomy. Carotid stenosis measurements (when applicable) are obtained utilizing NASCET criteria, using the distal internal carotid diameter as the denominator. RADIATION DOSE REDUCTION: This exam was performed according to the departmental dose-optimization program which includes automated exposure control, adjustment of the mA and/or kV according to patient size and/or use of iterative reconstruction technique. CONTRAST:  75mL OMNIPAQUE IOHEXOL 350 MG/ML SOLN COMPARISON:  None Available. FINDINGS: CT HEAD FINDINGS Brain: No acute hemorrhage. 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 Cortical gray-white differentiation is preserved. No hydrocephalus or extra-axial collection. No mass effect or midline shift. Vascular: No hyperdense vessel or unexpected calcification. Skull: No calvarial fracture or suspicious bone lesion. Skull base is  unremarkable. Sinuses/Orbits: No acute finding. Other: None. Review of the MIP images confirms the above findings CTA NECK FINDINGS Aortic arch: Three-vessel arch configuration. Arch vessel origins are patent. Right carotid system: No evidence of dissection, stenosis (  50% or greater), or occlusion. Mild mixed plaque along the right carotid bulb. Left carotid system: No evidence of dissection, stenosis (50% or greater), or occlusion. Mild mixed plaque along the proximal left cervical ICA. Vertebral arteries: Right dominant. No evidence of dissection, stenosis (50% or greater), or occlusion. Skeleton: Unremarkable. Other neck: Unremarkable. Upper chest: Unremarkable. Review of the MIP images confirms the above findings CTA HEAD FINDINGS Anterior circulation: Calcified plaque along the carotid siphons without hemodynamically significant stenosis. The proximal ACAs and MCAs are patent without stenosis or aneurysm. Distal branches are symmetric. Posterior circulation: Normal basilar artery. The SCAs, AICAs and PICAs are patent proximally. The PCAs are patent proximally without stenosis or aneurysm. Distal branches are symmetric. Venous sinuses: As permitted by contrast timing, patent. Anatomic variants: Persistent fetal origin of the bilateral PCAs with hypoplastic P1 segments. Review of the MIP images confirms the above findings IMPRESSION: 1. No acute intracranial abnormality. 2. No large vessel occlusion, hemodynamically significant stenosis, or aneurysm in the head or neck. 3. 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 Electronically Signed   By: Orvan Falconer M.D.   On: 01/03/2023 16:58    Microbiology: Results for orders placed or performed in visit on 11/03/19  Novel Coronavirus, NAA ([facility])     Status: None   Collection Time: 11/03/19  2:17 PM   Specimen: Nasopharyngeal(NP) swabs in vial transport medium   Nasopharynge  Screenin   Result Value Ref Range Status   SARS-CoV-2, NAA Not Detected Not Detected Final    Comment: This nucleic acid amplification test was developed and its performance characteristics determined by [employer]. Nucleic acid amplification tests include RT-PCR and TMA. This test has not been FDA cleared or approved. This test has been authorized by FDA under an Emergency Use Authorization (EUA). This test is only authorized for the duration of time the declaration that circumstances exist justifying the authorization of the emergency use of in vitro diagnostic tests for detection of SARS-CoV-2 virus and/or diagnosis of COVID-19 infection under section 564(b)(1) of the Act, 21 U.S.C. 098JXB-1(Y) (1), unless the authorization is terminated or revoked sooner. When diagnostic testing is negative, the possibility of a false negative result should be considered in the context of a patient's recent exposures and the presence of clinical signs and symptoms consistent with COVID-19. An individual without symptoms of COVID-19 and who is not shedding SARS-CoV-2 virus wo uld expect to have a negative (not detected) result in this assay.   SARS-COV-2, NAA 2 DAY TAT     Status: None   Collection Time: 11/03/19  2:17 PM   Nasopharynge  Screenin  Result Value Ref Range Status   SARS-CoV-2, NAA 2 DAY TAT Performed  Final    Labs: CBC: Recent Labs  Lab 01/03/23 1515 01/03/23 2151 01/04/23 0535 01/05/23 0708 01/06/23 0548  WBC 10.4 9.2 8.1 8.8 8.3  HGB 11.0* 12.4 12.0 11.8* 12.3  HCT 34.6* 38.3 38.6 38.2 39.8  MCV 83.4 83.1 85.6 85.7 85.4  PLT 268 283 265 274 266   Basic Metabolic Panel: Recent Labs  Lab 01/03/23 1515 01/03/23 2151 01/04/23 0535 01/05/23 0708 01/06/23 0548  NA 139  --  134* 132* 136  K 3.8  --  3.1* 3.8 3.8  CL 102  --  100 98 100  CO2 29  --  25 25 26   GLUCOSE 118*  --  77 113* 107*  BUN 12  --  8 12 11  CREATININE 0.53 0.49 0.36* 0.48 0.43*  CALCIUM 9.3   --  9.0 8.8* 9.4   Liver Function Tests: Recent Labs  Lab 01/04/23 0535  AST 60*  ALT 67*  ALKPHOS 127*  BILITOT 0.8  PROT 6.8  ALBUMIN 3.8   CBG: Recent Labs  Lab 01/05/23 1141 01/05/23 1624 01/05/23 2142 01/05/23 2303 01/06/23 0806  GLUCAP 149* 125* 75 189* 120*    Discharge time spent: {LESS THAN/GREATER THAN:26388} 30 minutes.  Signed: Marinda Elk, MD [facility] 01/06/2023

## 2023-01-06 NOTE — Hospital Course (Signed)
62 y.o. female with medical history significant of hypertension, diabetes mellitus, depression , Gastroesophageal reflux disease, hyperlipidemia chronic vertigo with daily vertiginous attacks who presents with several months of unsteady gait and several days of frequent falls, most recent fall resulting in injury to her chest.  Upon evaluation in the emergency department CT imaging of the chest revealed mildly displaced ninth and 10th rib fractures as well as a right lateral sixth rib fracture.  Urine toxicology screen was found to be positive for benzodiazepines and cannabinoids.  CT imaging initially was found to have a focal subcortical hypoattenuation.  Hospitalist group was then called to assess the patient for admission to the hospital.  Patient underwent a thorough evaluation for causes of her frequent falls.  MRI brain revealed no evidence of acute intracranial pathology.  Vitamin B12 was found to be.  Thorough evaluation there is concern that polypharmacy may be contributing to patient's symptoms as of late.  PT evaluated the patient felt that the patient would benefit from skilled services with home health physical therapy.

## 2023-01-06 NOTE — TOC Transition Note (Signed)
Transition of Care [facility]) - CM/SW Discharge Note   Patient Details  Name: Angela Burch MRN: [id number] Date of Birth: 11-28-1960  Transition of Care [facility]) CM/SW Contact:  Darleene Cleaver, LCSW Phone Number: 01/06/2023, 3:16 PM   Clinical Narrative:     CSW was informed by physician that patient is inquiring about HH now.  Originally patient declined with previous Chief Technology Officer.  CSW informed physician that due to patient's insurance she will most likely not be able to get [facility].    CSW spoke to patient to discuss the barriers of receiving home health.  Patient agreed to stay with the plan of going to outpatient therapy.    Patient has a referral for outpatient therapy at [facility] at [facility] [phone].  [address] [place] [address].    Patient also asked about a bedside commode, CSW informed her that insurance does not pay for it anymore, and suggested she check with [facility], [facility], [employer], or even [facility].  Patient then said she does not want to get a bedside commode now.    Per patient she does have a walker at home, and lives by herself.  Patient stated her daughter will pick her up once her discharge paperwork is ready.  CSW informed patient to speak to the nurse to find out when DC is complete.  Patient asked about short term disability, CSW informed her to speak to her HR department and they can assist with submitting required paperwork.  CSW asked if she has a PCP, per patient she does.  CSW updated attending physician, patient did not express any other TOC needs.    Final next level of care: OP Rehab Barriers to Discharge: Barriers Resolved   Patient Goals and CMS Choice CMS Medicare.gov Compare Post Acute Care list provided to:: Patient Choice offered to / list presented to : Patient  Discharge Placement  Home with outpatient therapy.                       Discharge Plan and Services Additional  resources added to the After Visit Summary for                  DME Arranged: N/A DME Agency: NA       HH Arranged: Patient Refused HH, Refused HH          Social Determinants of Health (SDOH) Interventions SDOH Screenings   Food Insecurity: No Food Insecurity (01/04/2023)  Housing: Low Risk  (01/04/2023)  Transportation Needs: No Transportation Needs (01/04/2023)  Utilities: Not At Risk (01/04/2023)  Tobacco Use: High Risk (01/04/2023)     Readmission Risk Interventions    01/04/2023    3:00 PM  Readmission Risk Prevention Plan  Post Dischage Appt Complete  Medication Screening Complete  Transportation Screening Complete

## 2023-01-06 NOTE — Progress Notes (Signed)
Mobility Specialist - Progress Note   01/06/23 1420  Mobility  Activity Ambulated with assistance in hallway  Level of Assistance Modified independent, requires aide device or extra time  Assistive Device Front wheel walker  Distance Ambulated (ft) 460 ft  Activity Response Tolerated well  Mobility Referral Yes  $Mobility charge 1 Mobility  Mobility Specialist Start Time (ACUTE ONLY) 0135  Mobility Specialist Stop Time (ACUTE ONLY) 0148  Mobility Specialist Time Calculation (min) (ACUTE ONLY) 13 min   Pt received in bed and agreeable to mobility. Upon returning to room, pt c/o feeling dizzy. No other complaints during session. Pt to bed after session with all needs met.    [facility]

## 2023-01-06 NOTE — Discharge Instructions (Addendum)
Please take all prescribed medications exactly as instructed. Please consume a low sodium low carbohydrate diet Please increase your physical activity as tolerated using an assistive device such as a walker. Please maintain all outpatient follow-up appointments including follow-up with your primary care provider.  If you continue to experience falls inquire with your provider about a referral to Neurology.  Additionally, you will be provided with contact information for rheumatology per your request. A referral has been placed for you for outpatient physical therapy / rehab.  You should be contacted with an appointment in the next several days. Please return to the emergency department if you develop continued falls, loss of consciousness, further injuries, fevers in excess of 100.4 F, weakness or inability to tolerate oral intake.

## 2023-01-07 DIAGNOSIS — S2249XA Multiple fractures of ribs, unspecified side, initial encounter for closed fracture: Secondary | ICD-10-CM

## 2023-01-07 DIAGNOSIS — E1142 Type 2 diabetes mellitus with diabetic polyneuropathy: Secondary | ICD-10-CM

## 2023-01-07 DIAGNOSIS — E1169 Type 2 diabetes mellitus with other specified complication: Secondary | ICD-10-CM

## 2023-01-07 DIAGNOSIS — K219 Gastro-esophageal reflux disease without esophagitis: Secondary | ICD-10-CM

## 2023-01-07 NOTE — Telephone Encounter (Signed)
HOSPITAL MEDICINE TELEPHONE ENCOUNTER NOTE    Patient contacted [place] via phone call due to her oxycodone not being available at her pharmacy.  This was a mistake on my part as I neglected to E-scribed this at time discharge.  Prescription sent in short order successfully.  Marinda Elk  MD [facility]

## 2023-07-09 ENCOUNTER — Observation Stay (HOSPITAL_COMMUNITY)
Admission: EM | Admit: 2023-07-09 | Discharge: 2023-07-10 | Disposition: A | Discharge status: Home / Self Care | Payer: Self-pay | Attending: Internal Medicine | Admitting: Internal Medicine

## 2023-07-09 ENCOUNTER — Other Ambulatory Visit: Payer: Self-pay

## 2023-07-09 ENCOUNTER — Encounter (HOSPITAL_COMMUNITY): Payer: Self-pay

## 2023-07-09 ENCOUNTER — Emergency Department (HOSPITAL_COMMUNITY): Payer: Self-pay

## 2023-07-09 DIAGNOSIS — Z794 Long term (current) use of insulin: Secondary | ICD-10-CM | POA: Diagnosis not present

## 2023-07-09 DIAGNOSIS — I959 Hypotension, unspecified: Secondary | ICD-10-CM | POA: Diagnosis present

## 2023-07-09 DIAGNOSIS — R9431 Abnormal electrocardiogram [ECG] [EKG]: Secondary | ICD-10-CM | POA: Diagnosis not present

## 2023-07-09 DIAGNOSIS — L89316 Pressure-induced deep tissue damage of right buttock: Secondary | ICD-10-CM | POA: Insufficient documentation

## 2023-07-09 DIAGNOSIS — E8729 Other acidosis: Secondary | ICD-10-CM

## 2023-07-09 DIAGNOSIS — I10 Essential (primary) hypertension: Secondary | ICD-10-CM | POA: Diagnosis not present

## 2023-07-09 DIAGNOSIS — R911 Solitary pulmonary nodule: Secondary | ICD-10-CM | POA: Insufficient documentation

## 2023-07-09 DIAGNOSIS — E1110 Type 2 diabetes mellitus with ketoacidosis without coma: Secondary | ICD-10-CM

## 2023-07-09 DIAGNOSIS — L89326 Pressure-induced deep tissue damage of left buttock: Secondary | ICD-10-CM | POA: Diagnosis not present

## 2023-07-09 DIAGNOSIS — Z720 Tobacco use: Secondary | ICD-10-CM | POA: Diagnosis present

## 2023-07-09 DIAGNOSIS — L8990 Pressure ulcer of unspecified site, unspecified stage: Secondary | ICD-10-CM | POA: Insufficient documentation

## 2023-07-09 DIAGNOSIS — Z79899 Other long term (current) drug therapy: Secondary | ICD-10-CM | POA: Diagnosis not present

## 2023-07-09 DIAGNOSIS — K2100 Gastro-esophageal reflux disease with esophagitis, without bleeding: Secondary | ICD-10-CM | POA: Diagnosis present

## 2023-07-09 DIAGNOSIS — I4892 Unspecified atrial flutter: Secondary | ICD-10-CM | POA: Diagnosis present

## 2023-07-09 DIAGNOSIS — F17210 Nicotine dependence, cigarettes, uncomplicated: Secondary | ICD-10-CM | POA: Insufficient documentation

## 2023-07-09 DIAGNOSIS — E1142 Type 2 diabetes mellitus with diabetic polyneuropathy: Secondary | ICD-10-CM | POA: Diagnosis not present

## 2023-07-09 DIAGNOSIS — R918 Other nonspecific abnormal finding of lung field: Secondary | ICD-10-CM | POA: Diagnosis present

## 2023-07-09 DIAGNOSIS — E860 Dehydration: Secondary | ICD-10-CM

## 2023-07-09 DIAGNOSIS — Z8673 Personal history of transient ischemic attack (TIA), and cerebral infarction without residual deficits: Secondary | ICD-10-CM | POA: Insufficient documentation

## 2023-07-09 DIAGNOSIS — J479 Bronchiectasis, uncomplicated: Secondary | ICD-10-CM | POA: Diagnosis not present

## 2023-07-09 HISTORY — DX: Type 2 diabetes mellitus with ketoacidosis without coma: E11.10

## 2023-07-09 LAB — CBC WITH DIFFERENTIAL/PLATELET
Abs Immature Granulocytes: 0.05 10*3/uL (ref 0.00–0.07)
Basophils Absolute: 0.1 10*3/uL (ref 0.0–0.1)
Basophils Relative: 1 %
Eosinophils Absolute: 0.1 10*3/uL (ref 0.0–0.5)
Eosinophils Relative: 1 %
HCT: 36.5 % (ref 36.0–46.0)
Hemoglobin: 11.9 g/dL — ABNORMAL LOW (ref 12.0–15.0)
Immature Granulocytes: 1 %
Lymphocytes Relative: 12 %
Lymphs Abs: 1.3 10*3/uL (ref 0.7–4.0)
MCH: 26.7 pg (ref 26.0–34.0)
MCHC: 32.6 g/dL (ref 30.0–36.0)
MCV: 81.8 fL (ref 80.0–100.0)
Monocytes Absolute: 1 10*3/uL (ref 0.1–1.0)
Monocytes Relative: 9 %
Neutro Abs: 8.4 10*3/uL — ABNORMAL HIGH (ref 1.7–7.7)
Neutrophils Relative %: 76 %
Platelets: 351 10*3/uL (ref 150–400)
RBC: 4.46 MIL/uL (ref 3.87–5.11)
RDW: 13.6 % (ref 11.5–15.5)
WBC: 10.9 10*3/uL — ABNORMAL HIGH (ref 4.0–10.5)
nRBC: 0 % (ref 0.0–0.2)

## 2023-07-09 LAB — CBG MONITORING, ED
Glucose-Capillary: 156 mg/dL — ABNORMAL HIGH (ref 70–99)
Glucose-Capillary: 144 mg/dL — ABNORMAL HIGH (ref 70–99)
Glucose-Capillary: 153 mg/dL — ABNORMAL HIGH (ref 70–99)
Glucose-Capillary: 159 mg/dL — ABNORMAL HIGH (ref 70–99)

## 2023-07-09 LAB — COMPREHENSIVE METABOLIC PANEL WITH GFR
ALT: 77 U/L — ABNORMAL HIGH (ref 0–44)
AST: 43 U/L — ABNORMAL HIGH (ref 15–41)
Albumin: 3.8 g/dL (ref 3.5–5.0)
Alkaline Phosphatase: 505 U/L — ABNORMAL HIGH (ref 38–126)
Anion gap: 21 — ABNORMAL HIGH (ref 5–15)
BUN: 27 mg/dL — ABNORMAL HIGH (ref 8–23)
CO2: 20 mmol/L — ABNORMAL LOW (ref 22–32)
Calcium: 9.8 mg/dL (ref 8.9–10.3)
Chloride: 91 mmol/L — ABNORMAL LOW (ref 98–111)
Creatinine, Ser: 1.56 mg/dL — ABNORMAL HIGH (ref 0.44–1.00)
GFR, Estimated: 37 mL/min — ABNORMAL LOW (ref >60)
Glucose, Bld: 191 mg/dL — ABNORMAL HIGH (ref 70–99)
Potassium: 3.7 mmol/L (ref 3.5–5.1)
Sodium: 132 mmol/L — ABNORMAL LOW (ref 135–145)
Total Bilirubin: 1.6 mg/dL — ABNORMAL HIGH (ref 0.0–1.2)
Total Protein: 7.5 g/dL (ref 6.5–8.1)

## 2023-07-09 LAB — URINALYSIS, ROUTINE W REFLEX MICROSCOPIC
Bacteria, UA: NONE SEEN
Bilirubin Urine: NEGATIVE
Glucose, UA: >=500 mg/dL — AB
Hgb urine dipstick: NEGATIVE
Ketones, ur: 80 mg/dL — AB
Leukocytes,Ua: NEGATIVE
Nitrite: NEGATIVE
Protein, ur: NEGATIVE mg/dL
Specific Gravity, Urine: 1.028 (ref 1.005–1.030)
pH: 5 (ref 5.0–8.0)

## 2023-07-09 LAB — BASIC METABOLIC PANEL WITH GFR
Anion gap: 15 (ref 5–15)
Anion gap: 13 (ref 5–15)
Anion gap: 11 (ref 5–15)
BUN: 21 mg/dL (ref 8–23)
BUN: 18 mg/dL (ref 8–23)
BUN: 14 mg/dL (ref 8–23)
CO2: 20 mmol/L — ABNORMAL LOW (ref 22–32)
CO2: 22 mmol/L (ref 22–32)
CO2: 24 mmol/L (ref 22–32)
Calcium: 9.4 mg/dL (ref 8.9–10.3)
Calcium: 9.2 mg/dL (ref 8.9–10.3)
Calcium: 9.2 mg/dL (ref 8.9–10.3)
Chloride: 98 mmol/L (ref 98–111)
Chloride: 98 mmol/L (ref 98–111)
Chloride: 100 mmol/L (ref 98–111)
Creatinine, Ser: 1.1 mg/dL — ABNORMAL HIGH (ref 0.44–1.00)
Creatinine, Ser: 0.9 mg/dL (ref 0.44–1.00)
Creatinine, Ser: 0.83 mg/dL (ref 0.44–1.00)
GFR, Estimated: 56 mL/min — ABNORMAL LOW (ref >60)
GFR, Estimated: >60 mL/min (ref >60)
GFR, Estimated: >60 mL/min (ref >60)
Glucose, Bld: 148 mg/dL — ABNORMAL HIGH (ref 70–99)
Glucose, Bld: 162 mg/dL — ABNORMAL HIGH (ref 70–99)
Glucose, Bld: 172 mg/dL — ABNORMAL HIGH (ref 70–99)
Potassium: 3.5 mmol/L (ref 3.5–5.1)
Potassium: 3.5 mmol/L (ref 3.5–5.1)
Potassium: 4.3 mmol/L (ref 3.5–5.1)
Sodium: 133 mmol/L — ABNORMAL LOW (ref 135–145)
Sodium: 133 mmol/L — ABNORMAL LOW (ref 135–145)
Sodium: 135 mmol/L (ref 135–145)

## 2023-07-09 LAB — BLOOD GAS, VENOUS
Acid-base deficit: 2 mmol/L (ref 0.0–2.0)
Bicarbonate: 22.3 mmol/L (ref 20.0–28.0)
O2 Saturation: 77.3 %
Patient temperature: 37
pCO2, Ven: 36 mmHg — ABNORMAL LOW (ref 44–60)
pH, Ven: 7.4 (ref 7.25–7.43)
pO2, Ven: 45 mmHg (ref 32–45)

## 2023-07-09 LAB — GLUCOSE, CAPILLARY
Glucose-Capillary: 142 mg/dL — ABNORMAL HIGH (ref 70–99)
Glucose-Capillary: 173 mg/dL — ABNORMAL HIGH (ref 70–99)
Glucose-Capillary: 156 mg/dL — ABNORMAL HIGH (ref 70–99)
Glucose-Capillary: 149 mg/dL — ABNORMAL HIGH (ref 70–99)
Glucose-Capillary: 91 mg/dL (ref 70–99)
Glucose-Capillary: 175 mg/dL — ABNORMAL HIGH (ref 70–99)

## 2023-07-09 LAB — BETA-HYDROXYBUTYRIC ACID
Beta-Hydroxybutyric Acid: 6.04 mmol/L — ABNORMAL HIGH (ref 0.05–0.27)
Beta-Hydroxybutyric Acid: 3.21 mmol/L — ABNORMAL HIGH (ref 0.05–0.27)
Beta-Hydroxybutyric Acid: 2.04 mmol/L — ABNORMAL HIGH (ref 0.05–0.27)
Beta-Hydroxybutyric Acid: 2.26 mmol/L — ABNORMAL HIGH (ref 0.05–0.27)

## 2023-07-09 LAB — CBC
HCT: 35.1 % — ABNORMAL LOW (ref 36.0–46.0)
Hemoglobin: 11.3 g/dL — ABNORMAL LOW (ref 12.0–15.0)
MCH: 27.1 pg (ref 26.0–34.0)
MCHC: 32.2 g/dL (ref 30.0–36.0)
MCV: 84.2 fL (ref 80.0–100.0)
Platelets: 300 10*3/uL (ref 150–400)
RBC: 4.17 MIL/uL (ref 3.87–5.11)
RDW: 13.8 % (ref 11.5–15.5)
WBC: 8.7 10*3/uL (ref 4.0–10.5)
nRBC: 0 % (ref 0.0–0.2)

## 2023-07-09 LAB — MAGNESIUM: Magnesium: 2 mg/dL (ref 1.7–2.4)

## 2023-07-09 LAB — TROPONIN I (HIGH SENSITIVITY)
Troponin I (High Sensitivity): 9 ng/L (ref <18)
Troponin I (High Sensitivity): 8 ng/L (ref <18)

## 2023-07-09 LAB — MRSA NEXT GEN BY PCR, NASAL: MRSA by PCR Next Gen: NOT DETECTED

## 2023-07-09 LAB — HEMOGLOBIN A1C
Hgb A1c MFr Bld: 10.7 % — ABNORMAL HIGH (ref 4.8–5.6)
Mean Plasma Glucose: 260.39 mg/dL

## 2023-07-09 LAB — I-STAT CG4 LACTIC ACID, ED: Lactic Acid, Venous: 1 mmol/L (ref 0.5–1.9)

## 2023-07-09 LAB — LIPASE, BLOOD: Lipase: 20 U/L (ref 11–51)

## 2023-07-09 LAB — PHOSPHORUS: Phosphorus: 2.8 mg/dL (ref 2.5–4.6)

## 2023-07-09 MED ORDER — INSULIN ASPART 100 UNIT/ML IJ SOLN: 0.0000 [IU] | Freq: Three times a day (TID) | INTRAMUSCULAR | Status: DC

## 2023-07-09 MED ORDER — PAROXETINE HCL 20 MG PO TABS
40.0000 mg | ORAL_TABLET | Freq: Every day | ORAL | Status: DC
  Administered 2023-07-09 – 2023-07-10 (×2): 40 mg via ORAL
  Filled 2023-07-09 (×2): qty 2

## 2023-07-09 MED ORDER — ACETAMINOPHEN 650 MG RE SUPP: 650.0000 mg | Freq: Four times a day (QID) | RECTAL | Status: DC | PRN

## 2023-07-09 MED ORDER — DEXTROSE 50 % IV SOLN: 0.0000 mL | INTRAVENOUS | Status: DC | PRN

## 2023-07-09 MED ORDER — ACETAMINOPHEN 325 MG PO TABS: 650.0000 mg | ORAL_TABLET | Freq: Four times a day (QID) | ORAL | Status: DC | PRN

## 2023-07-09 MED ORDER — K PHOS MONO-SOD PHOS DI & MONO 155-852-130 MG PO TABS
500.0000 mg | ORAL_TABLET | Freq: Two times a day (BID) | ORAL | Status: DC
  Administered 2023-07-09 – 2023-07-10 (×2): 500 mg via ORAL
  Filled 2023-07-09 (×2): qty 2

## 2023-07-09 MED ORDER — ALUM & MAG HYDROXIDE-SIMETH 200-200-20 MG/5ML PO SUSP
30.0000 mL | ORAL | Status: DC | PRN
  Administered 2023-07-09 – 2023-07-10 (×3): 30 mL via ORAL
  Filled 2023-07-09 (×3): qty 30

## 2023-07-09 MED ORDER — LACTATED RINGERS IV BOLUS
20.0000 mL/kg | Freq: Once | INTRAVENOUS | Status: AC
  Administered 2023-07-09: 1000 mL via INTRAVENOUS

## 2023-07-09 MED ORDER — PANTOPRAZOLE SODIUM 40 MG IV SOLR
40.0000 mg | Freq: Once | INTRAVENOUS | Status: AC
Start: 2023-07-09 — End: 2023-07-09
  Administered 2023-07-09: 40 mg via INTRAVENOUS
  Filled 2023-07-09: qty 10

## 2023-07-09 MED ORDER — INSULIN ASPART 100 UNIT/ML IJ SOLN
0.0000 [IU] | Freq: Three times a day (TID) | INTRAMUSCULAR | Status: DC
  Administered 2023-07-09 – 2023-07-10 (×2): 2 [IU] via SUBCUTANEOUS

## 2023-07-09 MED ORDER — LACTATED RINGERS IV BOLUS
1000.0000 mL | Freq: Once | INTRAVENOUS | Status: AC
  Administered 2023-07-09: 1000 mL via INTRAVENOUS

## 2023-07-09 MED ORDER — ONDANSETRON HCL 4 MG/2ML IJ SOLN
4.0000 mg | Freq: Once | INTRAMUSCULAR | Status: AC
  Administered 2023-07-09: 4 mg via INTRAVENOUS
  Filled 2023-07-09: qty 2

## 2023-07-09 MED ORDER — INSULIN ASPART 100 UNIT/ML IJ SOLN: 0.0000 [IU] | Freq: Every day | INTRAMUSCULAR | Status: DC

## 2023-07-09 MED ORDER — ORAL CARE MOUTH RINSE: 15.0000 mL | OROMUCOSAL | Status: DC | PRN

## 2023-07-09 MED ORDER — FENTANYL CITRATE PF 50 MCG/ML IJ SOSY
50.0000 ug | PREFILLED_SYRINGE | Freq: Once | INTRAMUSCULAR | Status: AC
  Administered 2023-07-09: 50 ug via INTRAVENOUS
  Filled 2023-07-09: qty 1

## 2023-07-09 MED ORDER — BUPROPION HCL ER (XL) 150 MG PO TB24
150.0000 mg | ORAL_TABLET | Freq: Every day | ORAL | Status: DC
  Administered 2023-07-09 – 2023-07-10 (×2): 150 mg via ORAL
  Filled 2023-07-09 (×2): qty 1

## 2023-07-09 MED ORDER — POTASSIUM CHLORIDE 10 MEQ/100ML IV SOLN
10.0000 meq | INTRAVENOUS | Status: AC
  Administered 2023-07-09 (×3): 10 meq via INTRAVENOUS
  Filled 2023-07-09 (×3): qty 100

## 2023-07-09 MED ORDER — DEXTROSE IN LACTATED RINGERS 5 % IV SOLN: INTRAVENOUS | Status: DC

## 2023-07-09 MED ORDER — LISINOPRIL 10 MG PO TABS
10.0000 mg | ORAL_TABLET | Freq: Every day | ORAL | Status: DC
  Administered 2023-07-09 – 2023-07-10 (×2): 10 mg via ORAL
  Filled 2023-07-09 (×2): qty 1

## 2023-07-09 MED ORDER — LACTATED RINGERS IV SOLN: INTRAVENOUS | Status: DC

## 2023-07-09 MED ORDER — INSULIN REGULAR(HUMAN) IN NACL 100-0.9 UT/100ML-% IV SOLN
INTRAVENOUS | Status: DC
  Administered 2023-07-09: 2.4 [IU]/h via INTRAVENOUS
  Filled 2023-07-09: qty 100

## 2023-07-09 MED ORDER — INSULIN GLARGINE-YFGN 100 UNIT/ML ~~LOC~~ SOLN
30.0000 [IU] | Freq: Two times a day (BID) | SUBCUTANEOUS | Status: DC
  Administered 2023-07-09 – 2023-07-10 (×3): 30 [IU] via SUBCUTANEOUS
  Filled 2023-07-09 (×4): qty 0.3

## 2023-07-09 MED ORDER — POTASSIUM CHLORIDE 10 MEQ/100ML IV SOLN
10.0000 meq | INTRAVENOUS | Status: AC
  Administered 2023-07-09: 10 meq via INTRAVENOUS
  Filled 2023-07-09: qty 100

## 2023-07-09 MED ORDER — ENOXAPARIN SODIUM 40 MG/0.4ML IJ SOSY
40.0000 mg | PREFILLED_SYRINGE | INTRAMUSCULAR | Status: DC
Start: 2023-07-09 — End: 2023-07-10
  Administered 2023-07-09: 40 mg via SUBCUTANEOUS
  Filled 2023-07-09: qty 0.4

## 2023-07-09 MED ORDER — FENTANYL CITRATE PF 50 MCG/ML IJ SOSY
50.0000 ug | PREFILLED_SYRINGE | INTRAMUSCULAR | Status: DC | PRN
  Administered 2023-07-09: 50 ug via INTRAVENOUS
  Filled 2023-07-09: qty 1

## 2023-07-09 MED ORDER — PANTOPRAZOLE SODIUM 40 MG PO TBEC
40.0000 mg | DELAYED_RELEASE_TABLET | Freq: Every day | ORAL | Status: DC
  Administered 2023-07-09 – 2023-07-10 (×2): 40 mg via ORAL
  Filled 2023-07-09 (×2): qty 1

## 2023-07-09 MED ORDER — CHLORHEXIDINE GLUCONATE CLOTH 2 % EX PADS
6.0000 | MEDICATED_PAD | Freq: Every day | CUTANEOUS | Status: DC
  Administered 2023-07-09 – 2023-07-10 (×2): 6 via TOPICAL

## 2023-07-09 MED ORDER — PROCHLORPERAZINE EDISYLATE 10 MG/2ML IJ SOLN
10.0000 mg | Freq: Once | INTRAMUSCULAR | Status: AC
  Administered 2023-07-09: 10 mg via INTRAVENOUS
  Filled 2023-07-09: qty 2

## 2023-07-09 MED ORDER — MAGNESIUM SULFATE 2 GM/50ML IV SOLN
2.0000 g | Freq: Once | INTRAVENOUS | Status: AC
  Administered 2023-07-09: 2 g via INTRAVENOUS
  Filled 2023-07-09: qty 50

## 2023-07-09 MED ORDER — PROCHLORPERAZINE EDISYLATE 10 MG/2ML IJ SOLN
10.0000 mg | Freq: Four times a day (QID) | INTRAMUSCULAR | Status: DC | PRN
  Administered 2023-07-10: 10 mg via INTRAVENOUS
  Filled 2023-07-09: qty 2

## 2023-07-09 MED ORDER — IOHEXOL 300 MG/ML  SOLN
75.0000 mL | Freq: Once | INTRAMUSCULAR | Status: AC | PRN
  Administered 2023-07-09: 75 mL via INTRAVENOUS

## 2023-07-09 NOTE — Plan of Care (Signed)

## 2023-07-09 NOTE — H&P (Signed)
 History and Physical    Patient: Angela Burch ZOX:[id number] DOB: Jun 18, 1960 DOA: 07/09/2023 DOS: the patient was seen and examined on 07/09/2023 PCP: Pcp, No  Patient coming from: Home  Chief Complaint:  Chief Complaint  Patient presents with   Hypotension   HPI: Angela Burch is a 63 y.o. female with medical history significant of history of paroxysmal atrial flutter, history of nonhemorrhagic CVA, history of falls, anxiety, type 2 diabetes, hyperlipidemia, hypertension, GERD with esophagitis, lung nodules, history of SLE who presented to the emergency department after EMS called a neighbor due to the patient being "sick".  She was found to be hypotensive with a blood pressure 76/40 mmHg, decreased appetite due to sharp anterior chest wall pain, abdominal pain, nausea, multiple episodes of emesis earlier in the evening who was brought via EMS to the emergency department.  EMS gave the patient IV fluids and 4 mg of ondansetron .  She has had some polyuria and polydipsia.  The patient stated that she has been rationing her insulin  because she does not have insurance.  She is supposed to use insulin  35 units twice daily, but is only using it once a day.  She denied fever, chills, rhinorrhea, sore throat, wheezing or hemoptysis.  No chest pain, palpitations, diaphoresis, PND, orthopnea or pitting edema of the lower extremities.  No diarrhea, constipation, melena or hematochezia.  No flank pain, dysuria, frequency or hematuria.  Lab work: Urinalysis was straw with greater than 500 glucose and ketones of 80 mg/dL.   CBC showed white count 10.9, hemoglobin 11.9 g deciliter platelets 351. Venous blood gas showed a pH of 7.4, pCO2 of 36 and the rest of the VBG measurements were unremarkable.  Beta hydroxybutyric acid was 6.04 mmol/L.  Troponin x 2, lactic acid and lipase were normal.  CMP showed a CO2 of 20 mmol/L with an anion gap of 21, the rest of the electrolytes were normal after sodium correction.   Glucose 191, BUN 27 and creatinine 1.56 mg/dL.  Total protein 7.5 and albumin 3.8 g/dL.  AST 43, ALT 77 and alkaline phosphatase 505 units/L.  Total bilirubin was 1.6 mg/dL.  Imaging: CT abdomen/pelvis with contrast shows small hiatal hernia, circumferential thickening of the distal esophagus which may reflect changes of underlying esophagitis, such as reflux esophagitis.  Progressive course of pulmonary infiltrates are seen within the posterior basal lung bases bilaterally with associated traction bronchiectasis in the setting of progressive interstitial fibrosis related to the patient's underlying connective tissue disorder.  There is trace right pleural effusion.  Aortic atherosclerosis.   ED course: Initial vital signs were temperature 98.6 F, pulse 97, respiration 18, BP 133/48 mmHg O2 sat 98% on room air.  The patient received 2452 mL of LR bolus, ondansetron  4 mg IVP and was started on an insulin  infusion.  Review of Systems: As mentioned in the history of present illness. All other systems reviewed and are negative. Past Medical History:  Diagnosis Date   Anxiety    Diabetes mellitus without complication (HCC)    Hypertension    Past Surgical History:  Procedure Laterality Date   APPENDECTOMY     CHOLECYSTECTOMY     Social History:  reports that she has been smoking cigarettes. She has never used smokeless tobacco. She reports that she does not drink alcohol and does not use drugs.  Allergies  Allergen Reactions   Sulfa Antibiotics Anaphylaxis and Hives   Fluoxetine    Metformin  And Related     No family  history on file.  Prior to Admission medications   Medication Sig Start Date End Date Taking? Authorizing Provider  celecoxib (CELEBREX) 200 MG capsule Take 200 mg by mouth daily. 07/13/22 08/15/23 Yes [provider]  losartan (COZAAR) 100 MG tablet Take 100 mg by mouth daily. 07/13/22  Yes [provider]  PARoxetine  (PAXIL ) 40 MG tablet Take 40 mg by mouth  daily at 6 (six) AM. 07/13/22  Yes [provider]  amLODipine  (NORVASC ) 10 MG tablet Take 1 tablet (10 mg total) by mouth daily. 06/19/16   Jamas Maywood, PA-C  amphetamine -dextroamphetamine  (ADDERALL) 30 MG tablet Take 1 tablet by mouth daily. 08/02/16   Jamas Maywood, PA-C  blood glucose meter kit and supplies KIT Dispense based on patient and insurance preference. Use up to four times daily as directed. (FOR ICD-9 250.00, 250.01). 06/19/16   Jamas Maywood, PA-C  buPROPion  (WELLBUTRIN  XL) 150 MG 24 hr tablet Take 150 mg by mouth daily. 05/13/17   [provider]  clonazePAM  (KLONOPIN ) 1 MG tablet Take 1 mg by mouth daily as needed for anxiety.  03/18/17   [provider]  cyclobenzaprine (FLEXERIL) 10 MG tablet Take 10 mg by mouth. 12/01/22   [provider]  diltiazem  (CARDIZEM  LA) 180 MG 24 hr tablet Take 180 mg by mouth daily.    [provider]  gabapentin  (NEURONTIN ) 300 MG capsule Take 300 mg by mouth. 12/28/22   [provider]  glucose blood (TRUETEST TEST) test strip Use as instructed 06/21/16   Jamas Maywood, PA-C  hydrOXYzine  (ATARAX /VISTARIL ) 25 MG tablet Take 1 tablet (25 mg total) by mouth at bedtime. 08/13/16   Jamas Maywood, PA-C  insulin  degludec (TRESIBA FLEXTOUCH) 100 UNIT/ML FlexTouch Pen Inject 40 Units into the skin daily.    [provider]  Insulin  Pen Needle (B-D UF III MINI PEN NEEDLES) 31G X 5 MM MISC 1 application by Does not apply route daily. 08/13/16   Jamas Maywood, PA-C  JARDIANCE  10 MG TABS tablet Take 10 mg by mouth daily. 11/23/22   [provider]  lisinopril  (PRINIVIL ,ZESTRIL ) 10 MG tablet Take 1 tablet (10 mg total) by mouth daily. 06/19/16   Jamas Maywood, PA-C  omeprazole (PRILOSEC) 40 MG capsule Take 40 mg by mouth daily. 12/01/22   [provider]  oxyCODONE  (ROXICODONE ) 5 MG immediate release tablet Take 1 tablet (5 mg total) by mouth every 4 (four) hours  as needed for severe pain (pain score 7-10). 01/06/23   Shalhoub, Merrill Abide, MD  rosuvastatin  (CRESTOR ) 10 MG tablet Take 10 mg by mouth daily. 12/02/22   [provider]  triamcinolone  cream (KENALOG ) 0.5 % APPLY 1 APPLICATION TOPICALLY 2 TIMES DAILY. DO NOT USE FOR MORE THAN 7 CONSECUTIVE DAYS WITHOUT MEDICAL APPROVAL. 07/19/16   Jamas Maywood, PA-C    Physical Exam: Vitals:   07/09/23 0551 07/09/23 0615 07/09/23 0645 07/09/23 0715  BP: 126/68 120/64 120/63 121/68  Pulse: 100 99 99 98  Resp: 17 19 16 17   Temp: 98.4 F (36.9 C)     TempSrc:      SpO2: 98% 92% 92% 96%  Weight:      Height:       Physical Exam Vitals reviewed.  Constitutional:      General: She is awake. She is not in acute distress.    Appearance: She is ill-appearing.  HENT:     Head: Normocephalic.     Nose: No rhinorrhea.  Mouth/Throat:     Mouth: Mucous membranes are moist.  Eyes:     General: No scleral icterus.    Pupils: Pupils are equal, round, and reactive to light.  Neck:     Vascular: No JVD.  Cardiovascular:     Rate and Rhythm: Normal rate and regular rhythm.     Heart sounds: S1 normal and S2 normal.  Pulmonary:     Breath sounds: No wheezing, rhonchi or rales.  Abdominal:     General: Bowel sounds are normal. There is no distension.     Palpations: Abdomen is soft.     Tenderness: There is no abdominal tenderness.  Musculoskeletal:     Cervical back: Neck supple.     Right lower leg: No edema.     Left lower leg: No edema.  Skin:    General: Skin is warm and dry.  Neurological:     Mental Status: She is alert and oriented to person, place, and time.  Psychiatric:        Mood and Affect: Mood normal.        Behavior: Behavior normal. Behavior is cooperative.    Data Reviewed:  Results are pending, will review when available.  EKG: Vent. rate 100 BPM PR interval 148 ms QRS duration 80 ms QT/QTcB 395/510 ms P-R-T axes 47 -62 58 Sinus tachycardia Inferior  infarct, old Anterior infarct, old Prolonged QT interval  Assessment and Plan: Principal Problem:   Ketoacidosis In the setting of: Type 2 diabetes mellitus with diabetic polyneuropathy,  with long-term current use of insulin  (HCC)  Observation/stepdown. Keep NPO until gap closes. - Then carbohydrate modified diet. Continue IV fluids. Continue insulin  infusion. Monitor CBG closely. BMP every 4 hours. BHA every 8 hours. Replace electrolytes as needed. Consult diabetes coordinator. Transition to SQ insulin  per Endo tool.  Active Problems:   Essential hypertension No longer on amlodipine . Having health insurance issues. -Not taking home antihypertensives daily. Parenteral antihypertensives while n.p.o. as needed.    Paroxysmal atrial flutter (HCC) Currently on NSR. Currently not using diltiazem  regularly. Keep electrolytes optimized.    Prolonged QT interval Avoid QT prolonging meds as possible. KCl supplementation. Magnesium sulfate 2 g IVPB now. Keep electrolytes optimized. Check EKG in the morning.    GERD with esophagitis Pantoprazole , 40 mg IVP daily. Resume home PPI afterwards. Follow-up with GI as an outpatient.    Lung nodules   Bronchiectasis (HCC) Did not have follow-up CT chest. Check CT chest without contrast.    Tobacco use Tobacco cessation advised. Nicotine  replacement therapy as needed.    Hypophosphatemia Replacing.     Advance Care Planning:   Code Status: Full Code   Consults:   Family Communication:   Severity of Illness: The appropriate patient status for this patient is OBSERVATION. Observation status is judged to be reasonable and necessary in order to provide the required intensity of service to ensure the patient's safety. The patient's presenting symptoms, physical exam findings, and initial radiographic and laboratory data in the context of their medical condition is felt to place them at decreased risk for further clinical  deterioration. Furthermore, it is anticipated that the patient will be medically stable for discharge from the hospital within 2 midnights of admission.   Author: Danice Dural, MD 07/09/2023 7:42 AM  For on call review [email or web address].   This document was prepared using Dragon voice recognition software and may contain some unintended transcription errors.

## 2023-07-09 NOTE — Progress Notes (Signed)
   07/09/23 1645  Spiritual Encounters  Type of Visit Initial  Care provided to: Pt not available  Referral source Other (comment) (Spiritual Consult)  Reason for visit Advance directives  OnCall Visit No   Chaplain responded to a spiritual consult for advanced directive education.  The patient was sleeping.   Clarence Croak [facility]  [phone]

## 2023-07-09 NOTE — ED Notes (Signed)
 Patient is to only get a total of 2 Liters LR per provider [place]

## 2023-07-09 NOTE — ED Provider Notes (Signed)
 [place] EMERGENCY DEPARTMENT AT [facility] Provider Note   CSN: [id number] Arrival date & time: 07/09/23  0004     History Chief Complaint  Patient presents with   Hypotension    HPI Angela Burch is a 63 y.o. female presenting for AMS/NV. States she has been feeling bad for approximately 5 days.  Worsened acutely tonight.  Patient's recorded medical, surgical, social, medication list and allergies were reviewed in the Snapshot window as part of the initial history.   Review of Systems   Review of Systems  Constitutional:  Negative for chills and fever.  HENT:  Negative for congestion, ear pain and sore throat.   Eyes:  Negative for pain and visual disturbance.  Respiratory:  Negative for cough and shortness of breath.   Cardiovascular:  Negative for chest pain and palpitations.  Gastrointestinal:  Positive for nausea and vomiting. Negative for abdominal pain.  Genitourinary:  Negative for dysuria and hematuria.  Musculoskeletal:  Negative for arthralgias and back pain.  Skin:  Negative for color change and rash.  Neurological:  Negative for seizures and syncope.  All other systems reviewed and are negative.   Physical Exam Updated Vital Signs BP (!) 130/54   Pulse 94   Temp 98.2 F (36.8 C) (Oral)   Resp 19   Ht 5' (1.524 m)   Wt 62.4 kg   SpO2 94%   BMI 26.87 kg/m  Physical Exam Vitals and nursing note reviewed.  Constitutional:      General: She is not in acute distress.    Appearance: She is well-developed.  HENT:     Head: Normocephalic and atraumatic.  Eyes:     Conjunctiva/sclera: Conjunctivae normal.  Cardiovascular:     Rate and Rhythm: Normal rate and regular rhythm.     Heart sounds: No murmur heard. Pulmonary:     Effort: Pulmonary effort is normal. No respiratory distress.     Breath sounds: Normal breath sounds.  Abdominal:     General: There is no distension.     Palpations: Abdomen is soft.     Tenderness: There is no  abdominal tenderness. There is no right CVA tenderness or left CVA tenderness.  Musculoskeletal:        General: No swelling or tenderness. Normal range of motion.     Cervical back: Neck supple.  Skin:    General: Skin is warm and dry.  Neurological:     General: No focal deficit present.     Mental Status: She is alert and oriented to person, place, and time. Mental status is at baseline.     Cranial Nerves: No cranial nerve deficit.      ED Course/ Medical Decision Making/ A&P    Procedures .Critical Care  Performed by: Onetha Bile, MD Authorized by: Onetha Bile, MD   Critical care provider statement:    Critical care time (minutes):  45   Critical care was time spent personally by me on the following activities:  Development of treatment plan with patient or surrogate, discussions with consultants, evaluation of patient's response to treatment, examination of patient, ordering and review of laboratory studies, ordering and review of radiographic studies, ordering and performing treatments and interventions, pulse oximetry, re-evaluation of patient's condition and review of old charts    Medications Ordered in ED Medications  fentaNYL (SUBLIMAZE) injection 50 mcg (50 mcg Intravenous Given 07/09/23 0159)  insulin  regular, human (MYXREDLIN) 100 units/ 100 mL infusion (0 Units/hr Intravenous Stopped 07/09/23 1231)  lactated ringers infusion (has no administration in time range)  dextrose 5 % in lactated ringers infusion ( Intravenous Stopped 07/09/23 1232)  dextrose 50 % solution 0-50 mL (has no administration in time range)  potassium chloride  10 mEq in 100 mL IVPB (0 mEq Intravenous Stopped 07/09/23 0658)  enoxaparin  (LOVENOX ) injection 40 mg (40 mg Subcutaneous Given 07/09/23 2139)  acetaminophen  (TYLENOL ) tablet 650 mg (has no administration in time range)    Or  acetaminophen  (TYLENOL ) suppository 650 mg (has no administration in time range)  prochlorperazine  (COMPAZINE) injection 10 mg (has no administration in time range)  Chlorhexidine Gluconate Cloth 2 % PADS 6 each (6 each Topical Given 07/09/23 0809)  Oral care mouth rinse (has no administration in time range)  insulin  glargine-yfgn (SEMGLEE ) injection 30 Units (30 Units Subcutaneous Given 07/09/23 2139)  insulin  aspart (novoLOG ) injection 0-5 Units ( Subcutaneous Not Given 07/09/23 2134)  insulin  aspart (novoLOG ) injection 0-15 Units ( Subcutaneous Not Given 07/09/23 1548)  buPROPion  (WELLBUTRIN  XL) 24 hr tablet 150 mg (150 mg Oral Given 07/09/23 1552)  PARoxetine  (PAXIL ) tablet 40 mg (40 mg Oral Given 07/09/23 1552)  pantoprazole  (PROTONIX ) EC tablet 40 mg (40 mg Oral Given 07/09/23 1552)  lisinopril  (ZESTRIL ) tablet 10 mg (10 mg Oral Given 07/09/23 1552)  phosphorus (K PHOS NEUTRAL) tablet 500 mg (500 mg Oral Given 07/09/23 1551)  alum & mag hydroxide-simeth (MAALOX/MYLANTA) 200-200-20 MG/5ML suspension 30 mL (30 mLs Oral Given 07/10/23 0119)  lactated ringers bolus 1,000 mL (0 mLs Intravenous Stopped 07/09/23 0611)  ondansetron  (ZOFRAN ) injection 4 mg (4 mg Intravenous Given 07/09/23 0158)  iohexol  (OMNIPAQUE ) 300 MG/ML solution 75 mL (75 mLs Intravenous Contrast Given 07/09/23 0257)  lactated ringers bolus 1,452 mL (0 mLs Intravenous Stopped 07/09/23 0718)  fentaNYL (SUBLIMAZE) injection 50 mcg (50 mcg Intravenous Given 07/09/23 1011)  pantoprazole  (PROTONIX ) injection 40 mg (40 mg Intravenous Given 07/09/23 1011)  prochlorperazine (COMPAZINE) injection 10 mg (10 mg Intravenous Given 07/09/23 1012)  potassium chloride  10 mEq in 100 mL IVPB (0 mEq Intravenous Stopped 07/09/23 1412)  magnesium sulfate IVPB 2 g 50 mL (0 g Intravenous Stopped 07/09/23 1518)    Medical Decision Making:   Angela Burch is a 63 y.o. female who presented to the ED today with AMS detailed above.    Patient placed on continuous vitals and telemetry monitoring while in ED which was reviewed periodically.  Complete initial  physical exam performed, notably the patient  was HDS in NAD.    Reviewed and confirmed nursing documentation for past medical history, family history, social history.    Initial Assessment:   With the patient's presentation of AMS, most likely diagnosis is metabolic etiology. Other diagnoses were considered including (but not limited to) intra-abdominal etiologies, process, cholecystitis, severe infection such as pneumonia.. These are considered less likely due to history of present illness and physical exam findings.   This is most consistent with an acute life/limb threatening illness complicated by underlying chronic conditions.  Initial Plan:  Screening labs including CBC and Metabolic panel to evaluate for infectious or metabolic etiology of disease.  Urinalysis with reflex culture ordered to evaluate for UTI or relevant urologic/nephrologic pathology.  CXR to evaluate for structural/infectious intrathoracic pathology.  EKG to evaluate for cardiac pathology Objective evaluation as below reviewed   Initial Study Results:   Laboratory  Multifocal findings concerning for diabetic ketoacidosis in the setting of euglycemia and Jardiance  use  EKG EKG was reviewed independently. Rate, rhythm, axis, intervals all examined  and without medically relevant abnormality. ST segments without concerns for elevations.    Radiology:  All images reviewed independently. Agree with radiology report at this time.   CT CHEST WO CONTRAST Result Date: 07/10/2023 CLINICAL DATA:  Abnormal xray - lung nodule, >= 1 cm EXAM: CT CHEST WITHOUT CONTRAST TECHNIQUE: Multidetector CT imaging of the chest was performed following the standard protocol without IV contrast. RADIATION DOSE REDUCTION: This exam was performed according to the departmental dose-optimization program which includes automated exposure control, adjustment of the mA and/or kV according to patient size and/or use of iterative reconstruction technique.  COMPARISON:  01/03/2023 FINDINGS: Cardiovascular: Moderate coronary artery calcification. Global cardiac size within normal limits. No pericardial effusion. Central pulmonary arteries are of normal caliber. Mild atherosclerotic calcification within the thoracic aorta. No aortic aneurysm. Mediastinum/Nodes: Esophagus is patulous suggesting underlying gastroesophageal reflux or esophageal dysmotility. There is, additionally, circumferential wall thickening of the distal esophagus, suggesting changes of esophagitis, such as reflux esophagitis. Small hiatal hernia noted. No pathologic thoracic adenopathy. Visualized thyroid is unremarkable. Lungs/Pleura: Previously noted pulmonary nodules within the right upper lobe have resolved in keeping with a infectious or inflammatory process. No new focal pulmonary nodules or infiltrates. Subsegmental atelectasis of the lower lobes bilaterally. Superimposed cylindrical bronchiectasis within the posterior basal segments of the lower lobes bilaterally, likely post inflammatory, is again noted. Small bilateral pleural effusions have developed, new since prior examination. No pneumothorax. No central obstructing lesion. Upper Abdomen: No acute abnormality. Musculoskeletal: Multiple remote bilateral rib fractures. No acute bone abnormality. No lytic or blastic bone lesion. Osseous structures are age-appropriate. IMPRESSION: 1. Interval resolution of right upper lobe pulmonary nodules in keeping with a infectious or inflammatory process. No new focal pulmonary nodules or infiltrates identified. 2. Moderate coronary artery calcification. 3. Patulous esophagus suggesting underlying gastroesophageal reflux or esophageal dysmotility. Circumferential wall thickening of the distal esophagus suggesting changes of esophagitis, such as reflux esophagitis. This would be better assessed with endoscopy if indicated. 4. Interval development of small bilateral pleural effusions with associated  bibasilar atelectasis. 5. Stable post inflammatory changes within the posterior basal segments of the lower lobes bilaterally. Aortic Atherosclerosis (ICD10-I70.0). Electronically Signed   By: Worthy Heads M.D.   On: 07/10/2023 01:51   CT ABDOMEN PELVIS W CONTRAST Result Date: 07/09/2023 CLINICAL DATA:  Acute nonlocalized abdominal pain, lupus EXAM: CT ABDOMEN AND PELVIS WITH CONTRAST TECHNIQUE: Multidetector CT imaging of the abdomen and pelvis was performed using the standard protocol following bolus administration of intravenous contrast. RADIATION DOSE REDUCTION: This exam was performed according to the departmental dose-optimization program which includes automated exposure control, adjustment of the mA and/or kV according to patient size and/or use of iterative reconstruction technique. CONTRAST:  75mL OMNIPAQUE  IOHEXOL  300 MG/ML  SOLN COMPARISON:  None Available. FINDINGS: Lower chest: Progressive coarse pulmonary infiltrates are seen within the posterior basal lung bases bilaterally with associated traction bronchiectasis in keeping with progressive interstitial fibrosis. Trace right pleural effusion. The cardiac size within normal limits. Small hiatal hernia is present. There is circumferential thickening of the distal esophagus which may reflect changes of underlying esophagitis, such as reflux esophagitis. Hepatobiliary: No focal liver abnormality is seen. Status post cholecystectomy. No biliary dilatation. Pancreas: Unremarkable Spleen: Normal in size without focal abnormality. Adrenals/Urinary Tract: Adrenal glands are unremarkable. Kidneys are normal, without renal calculi, focal lesion, or hydronephrosis. Bladder is distended, but is otherwise unremarkable. Stomach/Bowel: Stomach is within normal limits. Appendix absent. No evidence of bowel wall thickening, distention, or inflammatory changes. Vascular/Lymphatic:  Aortic atherosclerosis. No enlarged abdominal or pelvic lymph nodes. Reproductive:  Uterus and bilateral adnexa are unremarkable. Other: No abdominal wall hernia or abnormality. No abdominopelvic ascites. Musculoskeletal: No acute or significant osseous findings. Multiple remote left posterior rib fractures are noted. IMPRESSION: 1. Small hiatal hernia. There is circumferential thickening of the distal esophagus which may reflect changes of underlying esophagitis, such as reflux esophagitis. 2. Progressive coarse pulmonary infiltrates are seen within the posterior basal lung bases bilaterally with associated traction bronchiectasis in keeping with progressive interstitial fibrosis related to the patient's underlying connective tissue disorder. 3. Trace right pleural effusion. Aortic Atherosclerosis (ICD10-I70.0). Electronically Signed   By: Worthy Heads M.D.   On: 07/09/2023 03:41   Reassessment and Plan:   Patient's history of present was feels and findings are most consistent with diabetic ketoacidosis based on lab findings.   Disposition:   Based on the above findings, I believe this patient is stable for admission.    Patient/family educated about specific findings on our evaluation and explained exact reasons for admission.  Patient/family educated about clinical situation and time was allowed to answer questions.   Admission team communicated with and agreed with need for admission. Patient admitted. Patient ready to move at this time.     Emergency Department Medication Summary:   Medications  fentaNYL (SUBLIMAZE) injection 50 mcg (50 mcg Intravenous Given 07/09/23 0159)  insulin  regular, human (MYXREDLIN) 100 units/ 100 mL infusion (0 Units/hr Intravenous Stopped 07/09/23 1231)  lactated ringers infusion (has no administration in time range)  dextrose 5 % in lactated ringers infusion ( Intravenous Stopped 07/09/23 1232)  dextrose 50 % solution 0-50 mL (has no administration in time range)  potassium chloride  10 mEq in 100 mL IVPB (0 mEq Intravenous Stopped 07/09/23 0658)   enoxaparin  (LOVENOX ) injection 40 mg (40 mg Subcutaneous Given 07/09/23 2139)  acetaminophen  (TYLENOL ) tablet 650 mg (has no administration in time range)    Or  acetaminophen  (TYLENOL ) suppository 650 mg (has no administration in time range)  prochlorperazine (COMPAZINE) injection 10 mg (has no administration in time range)  Chlorhexidine Gluconate Cloth 2 % PADS 6 each (6 each Topical Given 07/09/23 0809)  Oral care mouth rinse (has no administration in time range)  insulin  glargine-yfgn (SEMGLEE ) injection 30 Units (30 Units Subcutaneous Given 07/09/23 2139)  insulin  aspart (novoLOG ) injection 0-5 Units ( Subcutaneous Not Given 07/09/23 2134)  insulin  aspart (novoLOG ) injection 0-15 Units ( Subcutaneous Not Given 07/09/23 1548)  buPROPion  (WELLBUTRIN  XL) 24 hr tablet 150 mg (150 mg Oral Given 07/09/23 1552)  PARoxetine  (PAXIL ) tablet 40 mg (40 mg Oral Given 07/09/23 1552)  pantoprazole  (PROTONIX ) EC tablet 40 mg (40 mg Oral Given 07/09/23 1552)  lisinopril  (ZESTRIL ) tablet 10 mg (10 mg Oral Given 07/09/23 1552)  phosphorus (K PHOS NEUTRAL) tablet 500 mg (500 mg Oral Given 07/09/23 1551)  alum & mag hydroxide-simeth (MAALOX/MYLANTA) 200-200-20 MG/5ML suspension 30 mL (30 mLs Oral Given 07/10/23 0119)  lactated ringers bolus 1,000 mL (0 mLs Intravenous Stopped 07/09/23 0611)  ondansetron  (ZOFRAN ) injection 4 mg (4 mg Intravenous Given 07/09/23 0158)  iohexol  (OMNIPAQUE ) 300 MG/ML solution 75 mL (75 mLs Intravenous Contrast Given 07/09/23 0257)  lactated ringers bolus 1,452 mL (0 mLs Intravenous Stopped 07/09/23 0718)  fentaNYL (SUBLIMAZE) injection 50 mcg (50 mcg Intravenous Given 07/09/23 1011)  pantoprazole  (PROTONIX ) injection 40 mg (40 mg Intravenous Given 07/09/23 1011)  prochlorperazine (COMPAZINE) injection 10 mg (10 mg Intravenous Given 07/09/23 1012)  potassium chloride  10 mEq in 100 mL  IVPB (0 mEq Intravenous Stopped 07/09/23 1412)  magnesium sulfate IVPB 2 g 50 mL (0 g Intravenous Stopped 07/09/23  1518)         Clinical Impression: No diagnosis found.   Admit   Final Clinical Impression(s) / ED Diagnoses Final diagnoses:  None    Rx / DC Orders ED Discharge Orders     None         Onetha Bile, MD 07/10/23 [phone]

## 2023-07-09 NOTE — Inpatient Diabetes Management (Addendum)
 Inpatient Diabetes Program Recommendations  AACE/ADA: New Consensus Statement on Inpatient Glycemic Control (2015)  Target Ranges:  Prepandial:   less than 140 mg/dL      Peak postprandial:   less than 180 mg/dL (1-2 hours)      Critically ill patients:  140 - 180 mg/dL   Lab Results  Component Value Date   GLUCAP 142 (H) 07/09/2023   HGBA1C 9.9 (H) 01/03/2023    Review of Glycemic Control  Diabetes history: DM2 Outpatient Diabetes medications: Tresiba 40 every day, Jardiance  10 QD Current orders for Inpatient glycemic control: IV insulin  per [employer] for DKA  HgbA1C - 9.9% on 01/03/23  Inpatient Diabetes Program Recommendations:    Continue IV insulin  per [employer] for DKA.  Needs updated HgbA1C  Will speak with pt today regarding her diabetes and HgbA1C of 9.9%  Continue to follow.  Thank you. Joni Net, RD, LDN, CDCES Inpatient Diabetes Coordinator [phone]  Addendum:  Spoke with pt at bedside. Pt was in pain and asked me to come back tomorrow.  Will f/u in am. RV

## 2023-07-09 NOTE — ED Triage Notes (Signed)
 Patient brought in by EMS. Per EMS patient neighbor called for patient being "sick" Patient was hypotensive BP 76/40. Decreased appetite and having sharp anterior chest wall pain under breast area. Onset of vomiting about 2hrs ago. Last BP with medics 112/54. Hx lupus 20G LAC 4 zofran  administered with medic.

## 2023-07-10 ENCOUNTER — Encounter (HOSPITAL_COMMUNITY): Payer: Self-pay | Admitting: Family Medicine

## 2023-07-10 ENCOUNTER — Observation Stay (HOSPITAL_COMMUNITY): Payer: Self-pay

## 2023-07-10 ENCOUNTER — Other Ambulatory Visit (HOSPITAL_COMMUNITY): Payer: Self-pay

## 2023-07-10 DIAGNOSIS — E1142 Type 2 diabetes mellitus with diabetic polyneuropathy: Principal | ICD-10-CM

## 2023-07-10 DIAGNOSIS — Z720 Tobacco use: Secondary | ICD-10-CM

## 2023-07-10 DIAGNOSIS — Z794 Long term (current) use of insulin: Secondary | ICD-10-CM

## 2023-07-10 DIAGNOSIS — I10 Essential (primary) hypertension: Secondary | ICD-10-CM

## 2023-07-10 DIAGNOSIS — I4892 Unspecified atrial flutter: Secondary | ICD-10-CM

## 2023-07-10 DIAGNOSIS — E1110 Type 2 diabetes mellitus with ketoacidosis without coma: Secondary | ICD-10-CM

## 2023-07-10 DIAGNOSIS — L8990 Pressure ulcer of unspecified site, unspecified stage: Secondary | ICD-10-CM | POA: Insufficient documentation

## 2023-07-10 DIAGNOSIS — K2100 Gastro-esophageal reflux disease with esophagitis, without bleeding: Secondary | ICD-10-CM

## 2023-07-10 LAB — GLUCOSE, CAPILLARY
Glucose-Capillary: 66 mg/dL — ABNORMAL LOW (ref 70–99)
Glucose-Capillary: 97 mg/dL (ref 70–99)
Glucose-Capillary: 146 mg/dL — ABNORMAL HIGH (ref 70–99)

## 2023-07-10 LAB — CBC
HCT: 35.5 % — ABNORMAL LOW (ref 36.0–46.0)
Hemoglobin: 11.3 g/dL — ABNORMAL LOW (ref 12.0–15.0)
MCH: 27.1 pg (ref 26.0–34.0)
MCHC: 31.8 g/dL (ref 30.0–36.0)
MCV: 85.1 fL (ref 80.0–100.0)
Platelets: 321 10*3/uL (ref 150–400)
RBC: 4.17 MIL/uL (ref 3.87–5.11)
RDW: 13.8 % (ref 11.5–15.5)
WBC: 8 10*3/uL (ref 4.0–10.5)
nRBC: 0 % (ref 0.0–0.2)

## 2023-07-10 LAB — COMPREHENSIVE METABOLIC PANEL WITH GFR
ALT: 43 U/L (ref 0–44)
AST: 19 U/L (ref 15–41)
Albumin: 3.4 g/dL — ABNORMAL LOW (ref 3.5–5.0)
Alkaline Phosphatase: 330 U/L — ABNORMAL HIGH (ref 38–126)
Anion gap: 8 (ref 5–15)
BUN: 13 mg/dL (ref 8–23)
CO2: 26 mmol/L (ref 22–32)
Calcium: 9.1 mg/dL (ref 8.9–10.3)
Chloride: 99 mmol/L (ref 98–111)
Creatinine, Ser: 0.7 mg/dL (ref 0.44–1.00)
GFR, Estimated: >60 mL/min (ref >60)
Glucose, Bld: 85 mg/dL (ref 70–99)
Potassium: 3.5 mmol/L (ref 3.5–5.1)
Sodium: 133 mmol/L — ABNORMAL LOW (ref 135–145)
Total Bilirubin: 1.1 mg/dL (ref 0.0–1.2)
Total Protein: 6.5 g/dL (ref 6.5–8.1)

## 2023-07-10 MED ORDER — BD PEN NEEDLE MINI ULTRAFINE 31G X 5 MM MISC
0 refills | Status: AC
  Filled 2023-07-10: qty 100, 30d supply, fill #0

## 2023-07-10 MED ORDER — LISINOPRIL 10 MG PO TABS
10.0000 mg | ORAL_TABLET | Freq: Every day | ORAL | 0 refills | Status: AC
  Filled 2023-07-10: qty 30, 30d supply, fill #0

## 2023-07-10 MED ORDER — MEDIHONEY WOUND/BURN DRESSING EX PSTE
1.0000 | PASTE | Freq: Every day | CUTANEOUS | Status: DC
  Administered 2023-07-10: 1 via TOPICAL
  Filled 2023-07-10: qty 44

## 2023-07-10 MED ORDER — CLONAZEPAM 1 MG PO TABS
1.0000 mg | ORAL_TABLET | Freq: Two times a day (BID) | ORAL | Status: DC | PRN
  Administered 2023-07-10: 1 mg via ORAL
  Filled 2023-07-10: qty 1

## 2023-07-10 MED ORDER — ALUM & MAG HYDROXIDE-SIMETH 200-200-20 MG/5ML PO SUSP
30.0000 mL | Freq: Once | ORAL | Status: AC
  Administered 2023-07-10: 30 mL via ORAL
  Filled 2023-07-10: qty 30

## 2023-07-10 MED ORDER — BASAGLAR KWIKPEN 100 UNIT/ML ~~LOC~~ SOPN
35.0000 [IU] | PEN_INJECTOR | Freq: Every day | SUBCUTANEOUS | 0 refills | Status: DC
  Filled 2023-07-10: qty 12, 30d supply, fill #0

## 2023-07-10 MED ORDER — INSULIN LISPRO (1 UNIT DIAL) 100 UNIT/ML (KWIKPEN)
3.0000 [IU] | PEN_INJECTOR | Freq: Three times a day (TID) | SUBCUTANEOUS | 0 refills | Status: AC
  Filled 2023-07-10: qty 9, 30d supply, fill #0

## 2023-07-10 NOTE — Assessment & Plan Note (Signed)
 07-10-2023 continue PPI. Will refer to GI at discharge.

## 2023-07-10 NOTE — Assessment & Plan Note (Signed)
 07-10-2023 stable. On lisinopril . No AKI.

## 2023-07-10 NOTE — Progress Notes (Signed)
   07/10/23 0935  Spiritual Encounters  Type of Visit Initial  Care provided to: Patient  Referral source Nurse (RN/NT/LPN)   Per spiritual care consult request by nursing staff, I visited briefly with Angela Burch.  I provided hospitality, assisting her bed position and introducing myself. Angela Burch was resting and not feeling well. Will attempt to visit at a later time.  Darvis Croft L. Minetta Aly, M.Div [phone]

## 2023-07-10 NOTE — Assessment & Plan Note (Signed)
 07-10-2023 was placed in insulin  gtts at admission. DKA quickly resolved. Likely euglycemic DKA. Pt has been taking on 1/2 doses of her Horace Lye so she can make her supply last. Has been using insulin  pens. Pt will need to change to insulin  in a vial. Will has diabetes educator see her. May need combo 70/30 vs nph + regular insulin . Pt has used vial/needle insulin  before. Transfer to floor.

## 2023-07-10 NOTE — Assessment & Plan Note (Signed)
 07-10-2023 CT shows resolution of prior seen nodules.

## 2023-07-10 NOTE — Inpatient Diabetes Management (Signed)
 Inpatient Diabetes Program Recommendations  AACE/ADA: New Consensus Statement on Inpatient Glycemic Control (2015)  Target Ranges:  Prepandial:   less than 140 mg/dL      Peak postprandial:   less than 180 mg/dL (1-2 hours)      Critically ill patients:  140 - 180 mg/dL   Lab Results  Component Value Date   GLUCAP 146 (H) 07/10/2023   HGBA1C 10.7 (H) 07/09/2023    Review of Glycemic Control  Diabetes history: DM2 Outpatient Diabetes medications: Tresiba 40 daily, Jardiance  10 daily Current orders for Inpatient glycemic control: Semglee  30 BID, Novolog  0-15 TID with meals and 0-5 HS  HgbA1C - 10.7%  Inpatient Diabetes Program Recommendations:    For discharge:  Basaglar  35 units daily Humalog 3 units TID with meals + 0-15  TID Jardiance  10 mg daily  Spoke with pt at bedside regarding her diabetes and HgbA1C of 10.7%. Pt states she lost her job in Dec 2024 and has no insurance, therefore did not refill prescription for insulin . States she is close to being evicted from house and had utilities cut off. Very tearful and said she would try to get insulin  as she knows she needs it. Talked about hypoglycemia s/s and treatment. Gave coupons from [employer] to use for Humalog and Basaglar . To f/u with PCP. Pt may apply for Medicaid. Explained how hyperglycemia leads to damage within blood vessels which lead to the common complications seen with uncontrolled diabetes. Stressed to the patient the importance of improving glycemic control to prevent further complications from uncontrolled diabetes. Discussed impact of nutrition, exercise, stress, sickness, and medications on diabetes control.   Answered all questions.  Thank you. Joni Net, RD, LDN, CDCES Inpatient Diabetes Coordinator [phone]

## 2023-07-10 NOTE — Discharge Summary (Signed)
 [facility] Physician Discharge Summary   Patient name: [name]  Admit date:     07/09/2023  Discharge date: 07/10/2023  Attending Physician: Walton Guppy [[id number]]  Discharge Physician: Unk Garb   PCP: Pcp, No  Admitted From: Home  Disposition:  Home  Recommendations for Outpatient Follow-up:  Follow up with PCP in 1-2 weeks Please follow up on the following pending results:  Home Health:No Equipment/Devices: None    Discharge Condition:Stable CODE STATUS:FULL Diet recommendation: Heart Healthy/Diabetic Fluid Restriction: None  Hospital Summary: HPI: Angela Burch is a 63 y.o. female with medical history significant of history of paroxysmal atrial flutter, history of nonhemorrhagic CVA, history of falls, anxiety, type 2 diabetes, hyperlipidemia, hypertension, GERD with esophagitis, lung nodules, history of SLE who presented to the emergency department after EMS called a neighbor due to the patient being "sick".  She was found to be hypotensive with a blood pressure 76/40 mmHg, decreased appetite due to sharp anterior chest wall pain, abdominal pain, nausea, multiple episodes of emesis earlier in the evening who was brought via EMS to the emergency department.  EMS gave the patient IV fluids and 4 mg of ondansetron .  She has had some polyuria and polydipsia.  The patient stated that she has been rationing her insulin  because she does not have insurance.  She is supposed to use insulin  35 units twice daily, but is only using it once a day.  She denied fever, chills, rhinorrhea, sore throat, wheezing or hemoptysis.  No chest pain, palpitations, diaphoresis, PND, orthopnea or pitting edema of the lower extremities.  No diarrhea, constipation, melena or hematochezia.  No flank pain, dysuria, frequency or hematuria.   ED course: Initial vital signs were temperature 98.6 F, pulse 97, respiration 18, BP 133/48 mmHg O2 sat 98% on room air.  The patient received 2452 mL  of LR bolus, ondansetron  4 mg IVP and was started on an insulin  infusion.  Significant Events: Admitted 07/09/2023  DKA   Significant Labs: urinalysis was straw with greater than 500 glucose and ketones of 80 mg/dL. CBC showed white count 10.9, hemoglobin 11.9 g deciliter platelets 351. Venous blood gas showed a pH of 7.4, pCO2 of 36 and the rest of the VBG measurements were unremarkable. Beta hydroxybutyric acid was 6.04 mmol/L. Troponin x 2, lactic acid and lipase were normal. CMP showed a CO2 of 20 mmol/L with an anion gap of 21, the rest of the electrolytes were normal after sodium correction. Glucose 191, BUN 27 and creatinine 1.56 mg/dL. Total protein 7.5 and albumin 3.8 g/dL. AST 43, ALT 77 and alkaline phosphatase 505 units/L. Total bilirubin was 1.6 mg/dL.   Significant Imaging Studies: CT abdomen/pelvis with contrast shows small hiatal hernia, circumferential thickening of the distal esophagus which may reflect changes of underlying esophagitis, such as reflux esophagitis. Progressive course of pulmonary infiltrates are seen within the posterior basal lung bases bilaterally with associated traction bronchiectasis in the setting of progressive interstitial fibrosis related to the patient's underlying connective tissue disorder. There is trace right pleural effusion. Aortic atherosclerosis.   Antibiotic Therapy: Anti-infectives (From admission, onward)    None       Procedures:   Consultants:    Hospital Course by Problem: * DKA, type 2 (HCC) 07-10-2023 was placed in insulin  gtts at admission. DKA quickly resolved. Likely euglycemic DKA. Pt has been taking on 1/2 doses of her Horace Lye so she can make her supply last. Has been using insulin  pens. Pt will need to  change to insulin  in a vial. Will has diabetes educator see her. May need combo 70/30 vs nph + regular insulin . Pt has used vial/needle insulin  before. Transfer to floor.  GERD with esophagitis 07-10-2023 continue PPI.  Will refer to GI at discharge.  Tobacco use 07-10-2023 pt advised to stop smoking.  Paroxysmal atrial flutter (HCC) 07-10-2023 stable. Currently in Nsr.  Essential hypertension 07-10-2023 stable. On lisinopril . No AKI.  Type 2 diabetes mellitus with diabetic polyneuropathy, with long-term current use of insulin  (HCC) 07-10-2023 uncontrolled, hyperglycemia. A1c 10.7% indicating poor outpatient control. Awaiting DM educator to see her.  *update. Pt seen by diabetes educator. She will print off coupons for patient for humalog and Basaglar . Will discharge to home with 35 units qday Basaglar  and 3 units TID with meals of Humalog plus SSI.  CM to see patient to see if pt is eligible for patient assistance program.  Lung nodules-resolved as of 07/10/2023 07-10-2023 CT shows resolution of prior seen nodules.  Pressure injury I agree with RN documentation  Pressure Injury 07/10/23 Buttocks Bilateral Deep Tissue Pressure Injury - Purple or maroon localized area of discolored intact skin or blood-filled blister due to damage of underlying soft tissue from pressure and/or shear. (Active)  07/10/23   Location: Buttocks  Location Orientation: Bilateral  Staging: Deep Tissue Pressure Injury - Purple or maroon localized area of discolored intact skin or blood-filled blister due to damage of underlying soft tissue from pressure and/or shear.  Wound Description (Comments):   Present on Admission: Yes        Discharge Diagnoses:  Principal Problem:   DKA, type 2 (HCC) Active Problems:   GERD with esophagitis   Essential hypertension   Paroxysmal atrial flutter (HCC)   Tobacco use   Type 2 diabetes mellitus with diabetic polyneuropathy, with long-term current use of insulin  (HCC)   Pressure injury   Discharge Instructions  Discharge Instructions     Call MD for:  difficulty breathing, headache or visual disturbances   Complete by: As directed    Call MD for:  extreme fatigue    Complete by: As directed    Call MD for:  hives   Complete by: As directed    Call MD for:  persistant dizziness or light-headedness   Complete by: As directed    Call MD for:  persistant nausea and vomiting   Complete by: As directed    Call MD for:  redness, tenderness, or signs of infection (pain, swelling, redness, odor or green/yellow discharge around incision site)   Complete by: As directed    Call MD for:  severe uncontrolled pain   Complete by: As directed    Call MD for:  temperature >100.4   Complete by: As directed    Diet - low sodium heart healthy   Complete by: As directed    Diet Carb Modified   Complete by: As directed    Discharge instructions   Complete by: As directed    1. Follow up with your primary care provider in 1-2 weeks following discharge from hospital.   Discharge wound care:   Complete by: As directed    Apply Medihoney to bilat buttocks Q day, then cover with foam dressing.  Change foam dressing Q 3 days or PRN soiling   Increase activity slowly   Complete by: As directed       Allergies as of 07/10/2023       Reactions   Sulfa Antibiotics Anaphylaxis, Hives   Metformin   And Related Diarrhea        Medication List     STOP taking these medications    amLODipine  10 MG tablet Commonly known as: NORVASC    cyclobenzaprine 10 MG tablet Commonly known as: FLEXERIL   losartan 100 MG tablet Commonly known as: COZAAR   Tresiba FlexTouch 100 UNIT/ML FlexTouch Pen Generic drug: insulin  degludec       TAKE these medications    amphetamine -dextroamphetamine  30 MG tablet Commonly known as: ADDERALL Take 1 tablet by mouth daily. What changed:  when to take this reasons to take this   Basaglar  KwikPen 100 UNIT/ML Inject 35 Units into the skin daily.   buPROPion  150 MG 24 hr tablet Commonly known as: WELLBUTRIN  XL Take 150 mg by mouth daily as needed (Patient does not have insurance which leads her to take her meds intermittently to  save money).   clonazePAM  1 MG tablet Commonly known as: KLONOPIN  Take 1 mg by mouth 2 (two) times daily as needed for anxiety.   diltiazem  180 MG 24 hr tablet Commonly known as: CARDIZEM  LA Take 180 mg by mouth daily as needed (Patient does not have insurance which leads her to take her meds intermittently to save money).   gabapentin  300 MG capsule Commonly known as: NEURONTIN  Take 300 mg by mouth daily as needed (Patient does not have insurance which leads her to take her meds intermittently to save money.).   insulin  lispro 100 UNIT/ML KwikPen Commonly known as: HUMALOG Inject 3 Units into the skin with breakfast, with lunch, and with evening meal. Plus SSI: CBG 70 - 120: 0 units CBG 121 - 150: 2 units CBG 151 - 200: 3 units CBG 201 - 250: 5 units CBG 251 - 300: 8 units CBG 301 - 350: 11 units CBG 351 - 400: 15 units   Jardiance  10 MG Tabs tablet Generic drug: empagliflozin  Take 10 mg by mouth daily as needed (Patient does not have insurance which leads her to take her meds intermittently to save money).   lisinopril  10 MG tablet Commonly known as: ZESTRIL  Take 1 tablet (10 mg total) by mouth daily. What changed:  when to take this reasons to take this   omeprazole 40 MG capsule Commonly known as: PRILOSEC Take 40 mg by mouth daily as needed (Patient does not have insurance which leads her to take her meds intermittently to save money).   PARoxetine  40 MG tablet Commonly known as: PAXIL  Take 40 mg by mouth daily as needed (Patient does not have insurance which leads her to take her meds intermittently to save money).   rosuvastatin  10 MG tablet Commonly known as: CRESTOR  Take 10 mg by mouth daily as needed (Patient does not have insurance which leads her to take her meds intermittently to save money).               Discharge Care Instructions  (From admission, onward)           Start     Ordered   07/10/23 0000  Discharge wound care:        Comments: Apply Medihoney to bilat buttocks Q day, then cover with foam dressing.  Change foam dressing Q 3 days or PRN soiling   07/10/23 1333            Allergies  Allergen Reactions   Sulfa Antibiotics Anaphylaxis and Hives   Metformin  And Related Diarrhea    Discharge Exam: Vitals:   07/10/23 1100 07/10/23 1201  BP: 129/77 108/64  Pulse: 92 89  Resp:  18  Temp:  98.1 F (36.7 C)  SpO2: 90% 98%    Physical Exam Vitals and nursing note reviewed.  HENT:     Head: Normocephalic and atraumatic.     Nose: Nose normal.  Eyes:     General: No scleral icterus. Cardiovascular:     Rate and Rhythm: Normal rate and regular rhythm.  Pulmonary:     Effort: Pulmonary effort is normal.     Breath sounds: Normal breath sounds.  Abdominal:     General: Bowel sounds are normal. There is no distension.     Palpations: Abdomen is soft.     Tenderness: There is no abdominal tenderness.  Musculoskeletal:     Right lower leg: No edema.     Left lower leg: No edema.  Skin:    General: Skin is warm and dry.     Coloration: Skin is not jaundiced.  Neurological:     Mental Status: She is alert and oriented to person, place, and time.     The results of significant diagnostics from this hospitalization (including imaging, microbiology, ancillary and laboratory) are listed below for reference.    Microbiology: Recent Results (from the past 240 hours)  MRSA Next Gen by PCR, Nasal     Status: None   Collection Time: 07/09/23  8:14 AM   Specimen: Nasal Mucosa; Nasal Swab  Result Value Ref Range Status   MRSA by PCR Next Gen NOT DETECTED NOT DETECTED Final    Comment: (NOTE) The GeneXpert MRSA Assay (FDA approved for NASAL specimens only), is one component of a comprehensive MRSA colonization surveillance program. It is not intended to diagnose MRSA infection nor to guide or monitor treatment for MRSA infections. Test performance is not FDA approved in patients less than 43  years old. Performed at [facility], [address], [place], [place] [address]      Labs:  Basic Metabolic Panel: Recent Labs  Lab 07/09/23 0055 07/09/23 0856 07/09/23 1200 07/09/23 1630 07/10/23 0306  NA 132* 133* 133* 135 133*  K 3.7 3.5 3.5 4.3 3.5  CL 91* 98 98 100 99  CO2 20* 20* 22 24 26   GLUCOSE 191* 148* 162* 172* 85  BUN 27* 21 18 14 13   CREATININE 1.56* 1.10* 0.90 0.83 0.70  CALCIUM  9.8 9.4 9.2 9.2 9.1  MG  --  2.0  --   --   --   PHOS  --  2.8  --   --   --    Liver Function Tests: Recent Labs  Lab 07/09/23 0055 07/10/23 0306  AST 43* 19  ALT 77* 43  ALKPHOS 505* 330*  BILITOT 1.6* 1.1  PROT 7.5 6.5  ALBUMIN 3.8 3.4*   Recent Labs  Lab 07/09/23 0055  LIPASE 20   CBC: Recent Labs  Lab 07/09/23 0055 07/09/23 0858 07/10/23 0306  WBC 10.9* 8.7 8.0  NEUTROABS 8.4*  --   --   HGB 11.9* 11.3* 11.3*  HCT 36.5 35.1* 35.5*  MCV 81.8 84.2 85.1  PLT 351 300 321   CBG: Recent Labs  Lab 07/09/23 1523 07/09/23 2127 07/10/23 0732 07/10/23 0804 07/10/23 1117  GLUCAP 91 175* 66* 97 146*   Hgb A1c Recent Labs    07/09/23 0856  HGBA1C 10.7*   Urinalysis    Component Value Date/Time   COLORURINE STRAW (A) 07/09/2023 0510   APPEARANCEUR CLEAR 07/09/2023 0510   LABSPEC  1.028 07/09/2023 0510   PHURINE 5.0 07/09/2023 0510   GLUCOSEU >=500 (A) 07/09/2023 0510   HGBUR NEGATIVE 07/09/2023 0510   BILIRUBINUR NEGATIVE 07/09/2023 0510   KETONESUR 80 (A) 07/09/2023 0510   PROTEINUR NEGATIVE 07/09/2023 0510   UROBILINOGEN 0.2 12/15/2011 1124   NITRITE NEGATIVE 07/09/2023 0510   LEUKOCYTESUR NEGATIVE 07/09/2023 0510   Sepsis Labs Recent Labs  Lab 07/09/23 0055 07/09/23 0858 07/10/23 0306  WBC 10.9* 8.7 8.0    Procedures/Studies: CT CHEST WO CONTRAST Result Date: 07/10/2023 CLINICAL DATA:  Abnormal xray - lung nodule, >= 1 cm EXAM: CT CHEST WITHOUT CONTRAST TECHNIQUE: Multidetector CT imaging of the chest was performed  following the standard protocol without IV contrast. RADIATION DOSE REDUCTION: This exam was performed according to the departmental dose-optimization program which includes automated exposure control, adjustment of the mA and/or kV according to patient size and/or use of iterative reconstruction technique. COMPARISON:  01/03/2023 FINDINGS: Cardiovascular: Moderate coronary artery calcification. Global cardiac size within normal limits. No pericardial effusion. Central pulmonary arteries are of normal caliber. Mild atherosclerotic calcification within the thoracic aorta. No aortic aneurysm. Mediastinum/Nodes: Esophagus is patulous suggesting underlying gastroesophageal reflux or esophageal dysmotility. There is, additionally, circumferential wall thickening of the distal esophagus, suggesting changes of esophagitis, such as reflux esophagitis. Small hiatal hernia noted. No pathologic thoracic adenopathy. Visualized thyroid is unremarkable. Lungs/Pleura: Previously noted pulmonary nodules within the right upper lobe have resolved in keeping with a infectious or inflammatory process. No new focal pulmonary nodules or infiltrates. Subsegmental atelectasis of the lower lobes bilaterally. Superimposed cylindrical bronchiectasis within the posterior basal segments of the lower lobes bilaterally, likely post inflammatory, is again noted. Small bilateral pleural effusions have developed, new since prior examination. No pneumothorax. No central obstructing lesion. Upper Abdomen: No acute abnormality. Musculoskeletal: Multiple remote bilateral rib fractures. No acute bone abnormality. No lytic or blastic bone lesion. Osseous structures are age-appropriate. IMPRESSION: 1. Interval resolution of right upper lobe pulmonary nodules in keeping with a infectious or inflammatory process. No new focal pulmonary nodules or infiltrates identified. 2. Moderate coronary artery calcification. 3. Patulous esophagus suggesting underlying  gastroesophageal reflux or esophageal dysmotility. Circumferential wall thickening of the distal esophagus suggesting changes of esophagitis, such as reflux esophagitis. This would be better assessed with endoscopy if indicated. 4. Interval development of small bilateral pleural effusions with associated bibasilar atelectasis. 5. Stable post inflammatory changes within the posterior basal segments of the lower lobes bilaterally. Aortic Atherosclerosis (ICD10-I70.0). Electronically Signed   By: Worthy Heads M.D.   On: 07/10/2023 01:51   CT ABDOMEN PELVIS W CONTRAST Result Date: 07/09/2023 CLINICAL DATA:  Acute nonlocalized abdominal pain, lupus EXAM: CT ABDOMEN AND PELVIS WITH CONTRAST TECHNIQUE: Multidetector CT imaging of the abdomen and pelvis was performed using the standard protocol following bolus administration of intravenous contrast. RADIATION DOSE REDUCTION: This exam was performed according to the departmental dose-optimization program which includes automated exposure control, adjustment of the mA and/or kV according to patient size and/or use of iterative reconstruction technique. CONTRAST:  75mL OMNIPAQUE  IOHEXOL  300 MG/ML  SOLN COMPARISON:  None Available. FINDINGS: Lower chest: Progressive coarse pulmonary infiltrates are seen within the posterior basal lung bases bilaterally with associated traction bronchiectasis in keeping with progressive interstitial fibrosis. Trace right pleural effusion. The cardiac size within normal limits. Small hiatal hernia is present. There is circumferential thickening of the distal esophagus which may reflect changes of underlying esophagitis, such as reflux esophagitis. Hepatobiliary: No focal liver abnormality is seen. Status post cholecystectomy. No  biliary dilatation. Pancreas: Unremarkable Spleen: Normal in size without focal abnormality. Adrenals/Urinary Tract: Adrenal glands are unremarkable. Kidneys are normal, without renal calculi, focal lesion, or  hydronephrosis. Bladder is distended, but is otherwise unremarkable. Stomach/Bowel: Stomach is within normal limits. Appendix absent. No evidence of bowel wall thickening, distention, or inflammatory changes. Vascular/Lymphatic: Aortic atherosclerosis. No enlarged abdominal or pelvic lymph nodes. Reproductive: Uterus and bilateral adnexa are unremarkable. Other: No abdominal wall hernia or abnormality. No abdominopelvic ascites. Musculoskeletal: No acute or significant osseous findings. Multiple remote left posterior rib fractures are noted. IMPRESSION: 1. Small hiatal hernia. There is circumferential thickening of the distal esophagus which may reflect changes of underlying esophagitis, such as reflux esophagitis. 2. Progressive coarse pulmonary infiltrates are seen within the posterior basal lung bases bilaterally with associated traction bronchiectasis in keeping with progressive interstitial fibrosis related to the patient's underlying connective tissue disorder. 3. Trace right pleural effusion. Aortic Atherosclerosis (ICD10-I70.0). Electronically Signed   By: Worthy Heads M.D.   On: 07/09/2023 03:41    Time coordinating discharge: 50 mins  SIGNED:  Unk Garb, DO [facility] 07/10/23, 1:35 PM

## 2023-07-10 NOTE — Plan of Care (Signed)
  Problem: Coping: Goal: Ability to adjust to condition or change in health will improve Outcome: Progressing   Problem: Fluid Volume: Goal: Ability to maintain a balanced intake and output will improve Outcome: Progressing   Problem: Health Behavior/Discharge Planning: Goal: Ability to identify and utilize available resources and services will improve Outcome: Progressing Goal: Ability to manage health-related needs will improve Outcome: Progressing

## 2023-07-10 NOTE — Assessment & Plan Note (Deleted)
07/10/2023

## 2023-07-10 NOTE — TOC Initial Note (Signed)
 Transition of Care [facility]) - Initial/Assessment Note    Patient Details  Name: Angela Burch MRN: [id number] Date of Birth: 11-Jul-1960  Transition of Care [facility]) CM/SW Contact:    Amaryllis Junior, LCSW Phone Number: 07/10/2023, 2:30 PM  Clinical Narrative:                 Pt from home. Lives alone; identifies children as support system. Pt reports some financial concerns. Resources added to AVS.   Expected Discharge Plan: Home/Self Care Barriers to Discharge: Financial Resources   Patient Goals and CMS Choice Patient states their goals for this hospitalization and ongoing recovery are:: return home CMS Medicare.gov Compare Post Acute Care list provided to::  (NA) Choice offered to / list presented to : NA [place] ownership interest in [facility].provided to::  (NA)    Expected Discharge Plan and Services In-house Referral: NA Discharge Planning Services: NA Post Acute Care Choice: NA Living arrangements for the past 2 months: Single Family Home Expected Discharge Date: 07/10/23               DME Arranged: N/A DME Agency: NA       HH Arranged: NA HH Agency: NA        Prior Living Arrangements/Services Living arrangements for the past 2 months: Single Family Home Lives with:: Self Patient language and need for interpreter reviewed:: Yes Do you feel safe going back to the place where you live?: Yes      Need for Family Participation in Patient Care: Yes (Comment) Care giver support system in place?: Yes (comment)   Criminal Activity/Legal Involvement Pertinent to Current Situation/Hospitalization: No - Comment as needed  Activities of Daily Living   ADL Screening (condition at time of admission) Independently performs ADLs?: Yes (appropriate for developmental age) Is the patient deaf or have difficulty hearing?: No Does the patient have difficulty seeing, even when wearing glasses/contacts?: No Does the patient have difficulty concentrating,  remembering, or making decisions?: No  Permission Sought/Granted                  Emotional Assessment Appearance:: Appears stated age Attitude/Demeanor/Rapport: Engaged Affect (typically observed): Accepting Orientation: : Oriented to Self, Oriented to Place, Oriented to  Time, Oriented to Situation Alcohol / Substance Use: Not Applicable Psych Involvement: No (comment)  Admission diagnosis:  Ketoacidosis [E87.29] Patient Active Problem List   Diagnosis Date Noted   Pressure injury 07/10/2023   DKA, type 2 (HCC) 07/09/2023   Bronchiectasis (HCC) 07/09/2023   Tobacco use 07/09/2023   GERD with esophagitis 07/09/2023   Type 2 diabetes mellitus with diabetic polyneuropathy, with long-term current use of insulin  (HCC) 01/07/2023   Mixed diabetic hyperlipidemia associated with type 2 diabetes mellitus (HCC) 01/07/2023   Anxiety 06/10/2016   Essential hypertension 06/10/2016   Diabetes (HCC) 06/10/2016   Paroxysmal atrial flutter (HCC) 06/10/2016   PCP:  Pcp, No Pharmacy:   [facility] [address] [place] - [address] [place] [address] Phone: [phone] Fax: [phone]  [id number] - [place], [place] - [address] AT [facility] [address] [place] [address] Phone: [phone] Fax: [phone]  [facility] [address] [place], [place] - [address] [place] [address] Phone: [phone] Fax: [phone]  [facility] [address] [place] [address] Phone: [phone] Fax: [phone]  Social Drivers of Health (SDOH) Social History: SDOH Screenings   Food Insecurity: Food Insecurity Present (07/09/2023)  Housing: High Risk (07/09/2023)  Transportation Needs: No Transportation Needs (07/09/2023)  Utilities: At Risk (07/09/2023)  Social Connections: Socially Isolated (07/09/2023)  Tobacco  Use: High Risk (07/09/2023)   SDOH Interventions:     Readmission Risk Interventions    01/04/2023    3:00 PM  Readmission Risk Prevention Plan  Post Dischage Appt Complete  Medication Screening Complete  Transportation Screening Complete

## 2023-07-10 NOTE — Evaluation (Signed)
 Physical Therapy Evaluation Patient Details Name: Angela Burch MRN: [id number] DOB: 08-Nov-1960 Today's Date: 07/10/2023  History of Present Illness  63 y.o. femaleho presented to the emergency department after EMS called a neighbor due to the patient being "sick". She was found to be hypotensive with a blood pressure 76/40 mmHg, decreased appetite due to sharp anterior chest wall pain, abdominal pain, nausea.  patient stated that she has been rationing her insulin  because she does not have insurance. admitted with Ketoacidosis   PMH: paroxysmal atrial flutter, history of nonhemorrhagic CVA, history of falls, anxiety, type 2 diabetes, hyperlipidemia, hypertension, GERD with esophagitis, lung nodules, history of SLE w  Clinical Impression  Patient evaluated by Physical Therapy with no further acute PT needs identified. All education has been completed and the patient has no further questions.  Reviewed mobility as below as well as progression of activity level  at home. Pt verbalizes understanding. No further PT needs at this time   See below for any follow-up Physical Therapy or equipment needs. PT is signing off. Thank you for this referral.         If plan is discharge home, recommend the following:     Can travel by private vehicle        Equipment Recommendations None recommended by PT  Recommendations for Other Services       Functional Status Assessment Patient has not had a recent decline in their functional status     Precautions / Restrictions Precautions Precautions: Fall Restrictions Weight Bearing Restrictions Per Provider Order: No      Mobility  Bed Mobility Overal bed mobility: Independent                  Transfers Overall transfer level: Independent Equipment used: None                    Ambulation/Gait Ambulation/Gait assistance: Independent Gait Distance (Feet): 350 Feet Assistive device: None Gait Pattern/deviations: Step-through  pattern, Drifts right/left Gait velocity: decr but functional     General Gait Details: occasional drifting, no LOB noted  Stairs            Wheelchair Mobility     Tilt Bed    Modified Rankin (Stroke Patients Only)       Balance Overall balance assessment: Needs assistance Sitting-balance support: Feet supported, No upper extremity supported Sitting balance-Leahy Scale: Normal Sitting balance - Comments: pt is able to turn R/L, don and doff slipper socks and pants, able reach outside BOS without assist   Standing balance support: During functional activity, No upper extremity supported Standing balance-Leahy Scale: Fair Standing balance comment: Fair to Good, NT to mod challenges; able to stand and finish LB dressing, reach outside BOS, pick up object from floor and recover without assist;             High level balance activites: Backward walking, Direction changes, Head turns High Level Balance Comments: no LOB with above             Pertinent Vitals/Pain Pain Assessment Pain Assessment: Faces Faces Pain Scale: Hurts a little bit Pain Location: esophageal/mid sternum Pain Descriptors / Indicators: Burning, Grimacing Pain Intervention(s): Limited activity within patient's tolerance, Monitored during session, Premedicated before session, Repositioned    Home Living Family/patient expects to be discharged to:: Private residence Living Arrangements: Alone Available Help at Discharge: Family;Available PRN/intermittently Type of Home: House Home Access: Stairs to enter   [employer] of Steps: 1  Home Layout: One level Home Equipment: Agricultural consultant (2 wheels);Crutches;Shower seat Additional Comments: local dtr that has 2 young children    Prior Function Prior Level of Function : Driving;Independent/Modified Independent             Mobility Comments: typically independent with amb, no device ADLs Comments: independent with ADLs,  iADLS     Extremity/Trunk Assessment   Upper Extremity Assessment Upper Extremity Assessment: Overall WFL for tasks assessed    Lower Extremity Assessment Lower Extremity Assessment: Overall WFL for tasks assessed    Cervical / Trunk Assessment Cervical / Trunk Assessment: Normal  Communication   Communication Communication: No apparent difficulties    Cognition Arousal: Alert Behavior During Therapy: WFL for tasks assessed/performed   PT - Cognitive impairments: No apparent impairments                         Following commands: Intact       Cueing Cueing Techniques: Verbal cues     General Comments      Exercises     Assessment/Plan    PT Assessment Patient does not need any further PT services  PT Problem List         PT Treatment Interventions      PT Goals (Current goals can be found in the Care Plan section)  Acute Rehab PT Goals PT Goal Formulation: All assessment and education complete, DC therapy    Frequency       Co-evaluation               AM-PAC PT "6 Clicks" Mobility  Outcome Measure Help needed turning from your back to your side while in a flat bed without using bedrails?: None Help needed moving from lying on your back to sitting on the side of a flat bed without using bedrails?: None Help needed moving to and from a bed to a chair (including a wheelchair)?: None Help needed standing up from a chair using your arms (e.g., wheelchair or bedside chair)?: None Help needed to walk in hospital room?: None Help needed climbing 3-5 steps with a railing? : None 6 Click Score: 24    End of Session   Activity Tolerance: Patient tolerated treatment well Patient left: Other (comment) (EOB) Nurse Communication: Mobility status PT Visit Diagnosis: Other abnormalities of gait and mobility (R26.89)    Time: 5409-8119 PT Time Calculation (min) (ACUTE ONLY): 20 min   Charges:   PT Evaluation $PT Eval Low Complexity: 1  Low   PT General Charges $$ ACUTE PT VISIT: 1 Visit         Radha Coggins, PT  Acute Rehab Dept [facility]) [phone]  07/10/2023   [facility] 07/10/2023, 4:36 PM

## 2023-07-10 NOTE — Progress Notes (Signed)
   07/10/23 1500  Spiritual Encounters  Type of Visit Initial  Care provided to: Patient  Reason for visit Advance directives  OnCall Visit No  Interventions  Spiritual Care Interventions Made Established relationship of care and support;Compassionate presence;Reflective listening;Normalization of emotions;Encouragement;Explored values/beliefs/practices/strengths  Intervention Outcomes  Outcomes Connection to spiritual care;Awareness around self/spiritual resourses;Awareness of support;Reduced isolation  Advance Directives (For Healthcare)  Does Patient Have a Medical Advance Directive? No  Would patient like information on creating a medical advance directive? Yes (Inpatient - patient defers creating a medical advance directive at this time - Information given)    Chaplain responded to consult request for Advanced Directive. Chaplain provided paperwork and information, along with spiritual care. Chaplain remains available.

## 2023-07-10 NOTE — Hospital Course (Signed)
 HPI: Angela Burch is a 63 y.o. female with medical history significant of history of paroxysmal atrial flutter, history of nonhemorrhagic CVA, history of falls, anxiety, type 2 diabetes, hyperlipidemia, hypertension, GERD with esophagitis, lung nodules, history of SLE who presented to the emergency department after EMS called a neighbor due to the patient being "sick".  She was found to be hypotensive with a blood pressure 76/40 mmHg, decreased appetite due to sharp anterior chest wall pain, abdominal pain, nausea, multiple episodes of emesis earlier in the evening who was brought via EMS to the emergency department.  EMS gave the patient IV fluids and 4 mg of ondansetron .  She has had some polyuria and polydipsia.  The patient stated that she has been rationing her insulin  because she does not have insurance.  She is supposed to use insulin  35 units twice daily, but is only using it once a day.  She denied fever, chills, rhinorrhea, sore throat, wheezing or hemoptysis.  No chest pain, palpitations, diaphoresis, PND, orthopnea or pitting edema of the lower extremities.  No diarrhea, constipation, melena or hematochezia.  No flank pain, dysuria, frequency or hematuria.   ED course: Initial vital signs were temperature 98.6 F, pulse 97, respiration 18, BP 133/48 mmHg O2 sat 98% on room air.  The patient received 2452 mL of LR bolus, ondansetron  4 mg IVP and was started on an insulin  infusion.  Significant Events: Admitted 07/09/2023  DKA   Significant Labs: urinalysis was straw with greater than 500 glucose and ketones of 80 mg/dL. CBC showed white count 10.9, hemoglobin 11.9 g deciliter platelets 351. Venous blood gas showed a pH of 7.4, pCO2 of 36 and the rest of the VBG measurements were unremarkable. Beta hydroxybutyric acid was 6.04 mmol/L. Troponin x 2, lactic acid and lipase were normal. CMP showed a CO2 of 20 mmol/L with an anion gap of 21, the rest of the electrolytes were normal after sodium  correction. Glucose 191, BUN 27 and creatinine 1.56 mg/dL. Total protein 7.5 and albumin 3.8 g/dL. AST 43, ALT 77 and alkaline phosphatase 505 units/L. Total bilirubin was 1.6 mg/dL.   Significant Imaging Studies: CT abdomen/pelvis with contrast shows small hiatal hernia, circumferential thickening of the distal esophagus which may reflect changes of underlying esophagitis, such as reflux esophagitis. Progressive course of pulmonary infiltrates are seen within the posterior basal lung bases bilaterally with associated traction bronchiectasis in the setting of progressive interstitial fibrosis related to the patient's underlying connective tissue disorder. There is trace right pleural effusion. Aortic atherosclerosis.   Antibiotic Therapy: Anti-infectives (From admission, onward)    None       Procedures:   Consultants:

## 2023-07-10 NOTE — Subjective & Objective (Signed)
 Pt seen and examined. C/o of GERD symptoms. On PPI. Maalox ordered. On solid food. DKA resolved. Pt has only been giving herself 50% doses of insulin  in order to make her supply in pen insulin  last longer.

## 2023-07-10 NOTE — Assessment & Plan Note (Signed)
 07-10-2023 pt advised to stop smoking.

## 2023-07-10 NOTE — Plan of Care (Signed)
 Problem: Education: Goal: Ability to describe self-care measures that may prevent or decrease complications (Diabetes Survival Skills Education) will improve Outcome: Adequate for Discharge Goal: Individualized Educational Video(s) Outcome: Adequate for Discharge   Problem: Coping: Goal: Ability to adjust to condition or change in health will improve Outcome: Adequate for Discharge   Problem: Fluid Volume: Goal: Ability to maintain a balanced intake and output will improve Outcome: Adequate for Discharge   Problem: Health Behavior/Discharge Planning: Goal: Ability to identify and utilize available resources and services will improve Outcome: Adequate for Discharge Goal: Ability to manage health-related needs will improve Outcome: Adequate for Discharge   Problem: Metabolic: Goal: Ability to maintain appropriate glucose levels will improve Outcome: Adequate for Discharge   Problem: Nutritional: Goal: Maintenance of adequate nutrition will improve Outcome: Adequate for Discharge Goal: Progress toward achieving an optimal weight will improve Outcome: Adequate for Discharge   Problem: Skin Integrity: Goal: Risk for impaired skin integrity will decrease Outcome: Adequate for Discharge   Problem: Tissue Perfusion: Goal: Adequacy of tissue perfusion will improve Outcome: Adequate for Discharge   Problem: Education: Goal: Ability to describe self-care measures that may prevent or decrease complications (Diabetes Survival Skills Education) will improve Outcome: Adequate for Discharge Goal: Individualized Educational Video(s) Outcome: Adequate for Discharge   Problem: Cardiac: Goal: Ability to maintain an adequate cardiac output will improve Outcome: Adequate for Discharge   Problem: Health Behavior/Discharge Planning: Goal: Ability to identify and utilize available resources and services will improve Outcome: Adequate for Discharge Goal: Ability to manage health-related  needs will improve Outcome: Adequate for Discharge   Problem: Fluid Volume: Goal: Ability to achieve a balanced intake and output will improve Outcome: Adequate for Discharge   Problem: Metabolic: Goal: Ability to maintain appropriate glucose levels will improve Outcome: Adequate for Discharge   Problem: Nutritional: Goal: Maintenance of adequate nutrition will improve Outcome: Adequate for Discharge Goal: Maintenance of adequate weight for body size and type will improve Outcome: Adequate for Discharge   Problem: Respiratory: Goal: Will regain and/or maintain adequate ventilation Outcome: Adequate for Discharge   Problem: Urinary Elimination: Goal: Ability to achieve and maintain adequate renal perfusion and functioning will improve Outcome: Adequate for Discharge   Problem: Education: Goal: Knowledge of General Education information will improve Description: Including pain rating scale, medication(s)/side effects and non-pharmacologic comfort measures Outcome: Adequate for Discharge   Problem: Health Behavior/Discharge Planning: Goal: Ability to manage health-related needs will improve Outcome: Adequate for Discharge   Problem: Clinical Measurements: Goal: Ability to maintain clinical measurements within normal limits will improve Outcome: Adequate for Discharge Goal: Will remain free from infection Outcome: Adequate for Discharge Goal: Diagnostic test results will improve Outcome: Adequate for Discharge Goal: Respiratory complications will improve Outcome: Adequate for Discharge Goal: Cardiovascular complication will be avoided Outcome: Adequate for Discharge   Problem: Activity: Goal: Risk for activity intolerance will decrease Outcome: Adequate for Discharge   Problem: Nutrition: Goal: Adequate nutrition will be maintained Outcome: Adequate for Discharge   Problem: Coping: Goal: Level of anxiety will decrease Outcome: Adequate for Discharge   Problem:  Elimination: Goal: Will not experience complications related to bowel motility Outcome: Adequate for Discharge Goal: Will not experience complications related to urinary retention Outcome: Adequate for Discharge   Problem: Pain Managment: Goal: General experience of comfort will improve and/or be controlled Outcome: Adequate for Discharge   Problem: Safety: Goal: Ability to remain free from injury will improve Outcome: Adequate for Discharge   Problem: Skin Integrity: Goal: Risk for impaired skin  integrity will decrease Outcome: Adequate for Discharge

## 2023-07-10 NOTE — TOC Progression Note (Signed)
 Transition of Care [facility]) - Progression Note    Patient Details  Name: Angela Burch MRN: [id number] Date of Birth: [birth year]  Transition of [facility]) CM/SW Contact  Kathryn Parish, RN Phone Number: 07/10/2023, 2:51 PM  Clinical Narrative:       Expected Discharge Plan: Home/Self Care Barriers to Discharge: Financial Resources  Expected Discharge Plan and Services In-house Referral: NA Discharge Planning Services: NA Post Acute Care Choice: NA Living arrangements for the past 2 months: Single Family Home Expected Discharge Date: 07/10/23               DME Arranged: N/A DME Agency: NA       HH Arranged: NA HH Agency: NA         Social Determinants of Health (SDOH) Interventions SDOH Screenings   Food Insecurity: Food Insecurity Present (07/09/2023)  Housing: High Risk (07/09/2023)  Transportation Needs: No Transportation Needs (07/09/2023)  Utilities: At Risk (07/09/2023)  Social Connections: Socially Isolated (07/09/2023)  Tobacco Use: High Risk (07/09/2023)    Readmission Risk Interventions    01/04/2023    3:00 PM  Readmission Risk Prevention Plan  Post Dischage Appt Complete  Medication Screening Complete  Transportation Screening Complete

## 2023-07-10 NOTE — Assessment & Plan Note (Signed)
 07-10-2023 stable. Currently in Nsr.

## 2023-07-10 NOTE — Progress Notes (Signed)
 PROGRESS NOTE    Angela Burch  AVW:[id number] DOB: 05-03-1960 DOA: 07/09/2023 PCP: Pcp, No  Subjective: Pt seen and examined. C/o of GERD symptoms. On PPI. Maalox ordered. On solid food. DKA resolved. Pt has only been giving herself 50% doses of insulin  in order to make her supply in pen insulin  last longer.   Hospital Course: HPI: Angela Burch is a 63 y.o. female with medical history significant of history of paroxysmal atrial flutter, history of nonhemorrhagic CVA, history of falls, anxiety, type 2 diabetes, hyperlipidemia, hypertension, GERD with esophagitis, lung nodules, history of SLE who presented to the emergency department after EMS called a neighbor due to the patient being "sick".  She was found to be hypotensive with a blood pressure 76/40 mmHg, decreased appetite due to sharp anterior chest wall pain, abdominal pain, nausea, multiple episodes of emesis earlier in the evening who was brought via EMS to the emergency department.  EMS gave the patient IV fluids and 4 mg of ondansetron .  She has had some polyuria and polydipsia.  The patient stated that she has been rationing her insulin  because she does not have insurance.  She is supposed to use insulin  35 units twice daily, but is only using it once a day.  She denied fever, chills, rhinorrhea, sore throat, wheezing or hemoptysis.  No chest pain, palpitations, diaphoresis, PND, orthopnea or pitting edema of the lower extremities.  No diarrhea, constipation, melena or hematochezia.  No flank pain, dysuria, frequency or hematuria.   ED course: Initial vital signs were temperature 98.6 F, pulse 97, respiration 18, BP 133/48 mmHg O2 sat 98% on room air.  The patient received 2452 mL of LR bolus, ondansetron  4 mg IVP and was started on an insulin  infusion.  Significant Events: Admitted 07/09/2023  DKA   Significant Labs: urinalysis was straw with greater than 500 glucose and ketones of 80 mg/dL. CBC showed white count 10.9,  hemoglobin 11.9 g deciliter platelets 351. Venous blood gas showed a pH of 7.4, pCO2 of 36 and the rest of the VBG measurements were unremarkable. Beta hydroxybutyric acid was 6.04 mmol/L. Troponin x 2, lactic acid and lipase were normal. CMP showed a CO2 of 20 mmol/L with an anion gap of 21, the rest of the electrolytes were normal after sodium correction. Glucose 191, BUN 27 and creatinine 1.56 mg/dL. Total protein 7.5 and albumin 3.8 g/dL. AST 43, ALT 77 and alkaline phosphatase 505 units/L. Total bilirubin was 1.6 mg/dL.   Significant Imaging Studies: CT abdomen/pelvis with contrast shows small hiatal hernia, circumferential thickening of the distal esophagus which may reflect changes of underlying esophagitis, such as reflux esophagitis. Progressive course of pulmonary infiltrates are seen within the posterior basal lung bases bilaterally with associated traction bronchiectasis in the setting of progressive interstitial fibrosis related to the patient's underlying connective tissue disorder. There is trace right pleural effusion. Aortic atherosclerosis.   Antibiotic Therapy: Anti-infectives (From admission, onward)    None       Procedures:   Consultants:     Assessment and Plan: * DKA, type 2 (HCC) 07-10-2023 was placed in insulin  gtts at admission. DKA quickly resolved. Likely euglycemic DKA. Pt has been taking on 1/2 doses of her Horace Lye so she can make her supply last. Has been using insulin  pens. Pt will need to change to insulin  in a vial. Will has diabetes educator see her. May need combo 70/30 vs nph + regular insulin . Pt has used vial/needle insulin  before. Transfer to floor.  GERD with esophagitis 07-10-2023 continue PPI. Will refer to GI at discharge.  Tobacco use 07-10-2023 pt advised to stop smoking.  Paroxysmal atrial flutter (HCC) 07-10-2023 stable. Currently in Nsr.  Essential hypertension 07-10-2023 stable. On lisinopril . No AKI.  Type 2 diabetes mellitus  with diabetic polyneuropathy, with long-term current use of insulin  (HCC) 07-10-2023 uncontrolled, hyperglycemia. A1c 10.7% indicating poor outpatient control. Awaiting DM educator to see her.  Lung nodules-resolved as of 07/10/2023 07-10-2023 CT shows resolution of prior seen nodules.   DVT prophylaxis: enoxaparin  (LOVENOX ) injection 40 mg Start: 07/09/23 2200    Code Status: Full Code Family Communication: pt is decisional. No family at bedside Disposition Plan: return home Reason for continuing need for hospitalization: medically stable for DC. Needs to see diabetes coordinator to see which [employer] insulin  would be cheaper for her.  Objective: Vitals:   07/10/23 0900 07/10/23 1000 07/10/23 1100 07/10/23 1201  BP: (!) 145/64 131/67 129/77 108/64  Pulse: 97 98 92 89  Resp:    18  Temp:    98.1 F (36.7 C)  TempSrc:    Oral  SpO2: 95% 95% 90% 98%  Weight:      Height:        Intake/Output Summary (Last 24 hours) at 07/10/2023 1207 Last data filed at 07/10/2023 1142 Gross per 24 hour  Intake 564.55 ml  Output 2300 ml  Net -1735.45 ml   Filed Weights   07/09/23 0012 07/09/23 0808  Weight: 72.6 kg 62.4 kg    Examination:  Physical Exam Vitals and nursing note reviewed.  HENT:     Head: Normocephalic and atraumatic.     Nose: Nose normal.  Eyes:     General: No scleral icterus. Cardiovascular:     Rate and Rhythm: Normal rate and regular rhythm.  Pulmonary:     Effort: Pulmonary effort is normal.     Breath sounds: Normal breath sounds.  Abdominal:     General: Bowel sounds are normal. There is no distension.     Palpations: Abdomen is soft.     Tenderness: There is no abdominal tenderness.  Musculoskeletal:     Right lower leg: No edema.     Left lower leg: No edema.  Skin:    General: Skin is warm and dry.     Coloration: Skin is not jaundiced.  Neurological:     Mental Status: She is alert and oriented to person, place, and time.     Data Reviewed: I  have personally reviewed following labs and imaging studies  CBC: Recent Labs  Lab 07/09/23 0055 07/09/23 0858 07/10/23 0306  WBC 10.9* 8.7 8.0  NEUTROABS 8.4*  --   --   HGB 11.9* 11.3* 11.3*  HCT 36.5 35.1* 35.5*  MCV 81.8 84.2 85.1  PLT 351 300 321   Basic Metabolic Panel: Recent Labs  Lab 07/09/23 0055 07/09/23 0856 07/09/23 1200 07/09/23 1630 07/10/23 0306  NA 132* 133* 133* 135 133*  K 3.7 3.5 3.5 4.3 3.5  CL 91* 98 98 100 99  CO2 20* 20* 22 24 26   GLUCOSE 191* 148* 162* 172* 85  BUN 27* 21 18 14 13   CREATININE 1.56* 1.10* 0.90 0.83 0.70  CALCIUM  9.8 9.4 9.2 9.2 9.1  MG  --  2.0  --   --   --   PHOS  --  2.8  --   --   --    GFR: Estimated Creatinine Clearance: 58.7 mL/min (by C-G formula based on SCr  of 0.7 mg/dL). Liver Function Tests: Recent Labs  Lab 07/09/23 0055 07/10/23 0306  AST 43* 19  ALT 77* 43  ALKPHOS 505* 330*  BILITOT 1.6* 1.1  PROT 7.5 6.5  ALBUMIN 3.8 3.4*   Recent Labs  Lab 07/09/23 0055  LIPASE 20   HbA1C: Recent Labs    07/09/23 0856  HGBA1C 10.7*   CBG: Recent Labs  Lab 07/09/23 1523 07/09/23 2127 07/10/23 0732 07/10/23 0804 07/10/23 1117  GLUCAP 91 175* 66* 97 146*   Sepsis Labs: Recent Labs  Lab 07/09/23 0216  LATICACIDVEN 1.0    Recent Results (from the past 240 hours)  MRSA Next Gen by PCR, Nasal     Status: None   Collection Time: 07/09/23  8:14 AM   Specimen: Nasal Mucosa; Nasal Swab  Result Value Ref Range Status   MRSA by PCR Next Gen NOT DETECTED NOT DETECTED Final    Comment: (NOTE) The GeneXpert MRSA Assay (FDA approved for NASAL specimens only), is one component of a comprehensive MRSA colonization surveillance program. It is not intended to diagnose MRSA infection nor to guide or monitor treatment for MRSA infections. Test performance is not FDA approved in patients less than 71 years old. Performed at [facility], [address], [place], [place] [address]       Radiology Studies: CT CHEST WO CONTRAST Result Date: 07/10/2023 CLINICAL DATA:  Abnormal xray - lung nodule, >= 1 cm EXAM: CT CHEST WITHOUT CONTRAST TECHNIQUE: Multidetector CT imaging of the chest was performed following the standard protocol without IV contrast. RADIATION DOSE REDUCTION: This exam was performed according to the departmental dose-optimization program which includes automated exposure control, adjustment of the mA and/or kV according to patient size and/or use of iterative reconstruction technique. COMPARISON:  01/03/2023 FINDINGS: Cardiovascular: Moderate coronary artery calcification. Global cardiac size within normal limits. No pericardial effusion. Central pulmonary arteries are of normal caliber. Mild atherosclerotic calcification within the thoracic aorta. No aortic aneurysm. Mediastinum/Nodes: Esophagus is patulous suggesting underlying gastroesophageal reflux or esophageal dysmotility. There is, additionally, circumferential wall thickening of the distal esophagus, suggesting changes of esophagitis, such as reflux esophagitis. Small hiatal hernia noted. No pathologic thoracic adenopathy. Visualized thyroid is unremarkable. Lungs/Pleura: Previously noted pulmonary nodules within the right upper lobe have resolved in keeping with a infectious or inflammatory process. No new focal pulmonary nodules or infiltrates. Subsegmental atelectasis of the lower lobes bilaterally. Superimposed cylindrical bronchiectasis within the posterior basal segments of the lower lobes bilaterally, likely post inflammatory, is again noted. Small bilateral pleural effusions have developed, new since prior examination. No pneumothorax. No central obstructing lesion. Upper Abdomen: No acute abnormality. Musculoskeletal: Multiple remote bilateral rib fractures. No acute bone abnormality. No lytic or blastic bone lesion. Osseous structures are age-appropriate. IMPRESSION: 1. Interval resolution of right upper lobe  pulmonary nodules in keeping with a infectious or inflammatory process. No new focal pulmonary nodules or infiltrates identified. 2. Moderate coronary artery calcification. 3. Patulous esophagus suggesting underlying gastroesophageal reflux or esophageal dysmotility. Circumferential wall thickening of the distal esophagus suggesting changes of esophagitis, such as reflux esophagitis. This would be better assessed with endoscopy if indicated. 4. Interval development of small bilateral pleural effusions with associated bibasilar atelectasis. 5. Stable post inflammatory changes within the posterior basal segments of the lower lobes bilaterally. Aortic Atherosclerosis (ICD10-I70.0). Electronically Signed   By: Worthy Heads M.D.   On: 07/10/2023 01:51   CT ABDOMEN PELVIS W CONTRAST Result Date: 07/09/2023 CLINICAL DATA:  Acute nonlocalized abdominal  pain, lupus EXAM: CT ABDOMEN AND PELVIS WITH CONTRAST TECHNIQUE: Multidetector CT imaging of the abdomen and pelvis was performed using the standard protocol following bolus administration of intravenous contrast. RADIATION DOSE REDUCTION: This exam was performed according to the departmental dose-optimization program which includes automated exposure control, adjustment of the mA and/or kV according to patient size and/or use of iterative reconstruction technique. CONTRAST:  75mL OMNIPAQUE  IOHEXOL  300 MG/ML  SOLN COMPARISON:  None Available. FINDINGS: Lower chest: Progressive coarse pulmonary infiltrates are seen within the posterior basal lung bases bilaterally with associated traction bronchiectasis in keeping with progressive interstitial fibrosis. Trace right pleural effusion. The cardiac size within normal limits. Small hiatal hernia is present. There is circumferential thickening of the distal esophagus which may reflect changes of underlying esophagitis, such as reflux esophagitis. Hepatobiliary: No focal liver abnormality is seen. Status post cholecystectomy. No  biliary dilatation. Pancreas: Unremarkable Spleen: Normal in size without focal abnormality. Adrenals/Urinary Tract: Adrenal glands are unremarkable. Kidneys are normal, without renal calculi, focal lesion, or hydronephrosis. Bladder is distended, but is otherwise unremarkable. Stomach/Bowel: Stomach is within normal limits. Appendix absent. No evidence of bowel wall thickening, distention, or inflammatory changes. Vascular/Lymphatic: Aortic atherosclerosis. No enlarged abdominal or pelvic lymph nodes. Reproductive: Uterus and bilateral adnexa are unremarkable. Other: No abdominal wall hernia or abnormality. No abdominopelvic ascites. Musculoskeletal: No acute or significant osseous findings. Multiple remote left posterior rib fractures are noted. IMPRESSION: 1. Small hiatal hernia. There is circumferential thickening of the distal esophagus which may reflect changes of underlying esophagitis, such as reflux esophagitis. 2. Progressive coarse pulmonary infiltrates are seen within the posterior basal lung bases bilaterally with associated traction bronchiectasis in keeping with progressive interstitial fibrosis related to the patient's underlying connective tissue disorder. 3. Trace right pleural effusion. Aortic Atherosclerosis (ICD10-I70.0). Electronically Signed   By: Worthy Heads M.D.   On: 07/09/2023 03:41    Scheduled Meds:  buPROPion   150 mg Oral Daily   Chlorhexidine Gluconate Cloth  6 each Topical Daily   enoxaparin  (LOVENOX ) injection  40 mg Subcutaneous Q24H   insulin  aspart  0-15 Units Subcutaneous TID WC   insulin  aspart  0-5 Units Subcutaneous QHS   insulin  glargine-yfgn  30 Units Subcutaneous BID   lisinopril   10 mg Oral Daily   pantoprazole   40 mg Oral Daily   PARoxetine   40 mg Oral Daily   Continuous Infusions:   LOS: 0 days   Time spent: 50 minutes  Unk Garb, DO  [facility]  07/10/2023, 12:07 PM

## 2023-07-10 NOTE — Assessment & Plan Note (Addendum)
 07-10-2023 uncontrolled, hyperglycemia. A1c 10.7% indicating poor outpatient control. Awaiting DM educator to see her.  *update. Pt seen by diabetes educator. She will print off coupons for patient for humalog and Basaglar . Will discharge to home with 35 units qday Basaglar  and 3 units TID with meals of Humalog plus SSI.  CM to see patient to see if pt is eligible for patient assistance program.

## 2023-07-10 NOTE — Consult Note (Signed)
 WOC Nurse Consult Note: Reason for Consult: Consult requested for buttocks.  Performed remotely after review of progress notes and photo in the EMR.  Pt has dark red purple Deep tissue pressure injuries to bilat buttocks with loose skin beginning to peel at the edges.  Pressure Injury POA: Yes Dressing procedure/placement/frequency: Topical treatment orders provided for bedside nurses to perform as follows to assist with removal of nonviable tissue: Apply Medihoney to bilat buttocks Q day, then cover with foam dressing.  Change foam dressing Q 3 days or PRN soiling. Please re-consult if further assistance is needed.  Thank-you,  Wiliam Harder MSN, RN, CWOCN, [place], CNS [phone]

## 2023-07-10 NOTE — Assessment & Plan Note (Signed)
 I agree with RN documentation  Pressure Injury 07/10/23 Buttocks Bilateral Deep Tissue Pressure Injury - Purple or maroon localized area of discolored intact skin or blood-filled blister due to damage of underlying soft tissue from pressure and/or shear. (Active)  07/10/23   Location: Buttocks  Location Orientation: Bilateral  Staging: Deep Tissue Pressure Injury - Purple or maroon localized area of discolored intact skin or blood-filled blister due to damage of underlying soft tissue from pressure and/or shear.  Wound Description (Comments):   Present on Admission: Yes

## 2023-07-10 NOTE — Progress Notes (Signed)
 Discharge medications delivered to patient at bedside D [facility]

## 2023-07-11 ENCOUNTER — Other Ambulatory Visit (HOSPITAL_COMMUNITY): Payer: Self-pay

## 2023-07-12 ENCOUNTER — Encounter (HOSPITAL_COMMUNITY): Payer: Self-pay

## 2023-07-12 ENCOUNTER — Emergency Department (HOSPITAL_COMMUNITY): Payer: Self-pay

## 2023-07-12 ENCOUNTER — Other Ambulatory Visit: Payer: Self-pay

## 2023-07-12 ENCOUNTER — Emergency Department (HOSPITAL_COMMUNITY)
Admission: EM | Admit: 2023-07-12 | Discharge: 2023-07-12 | Disposition: A | Discharge status: Home / Self Care | Payer: Self-pay | Attending: Emergency Medicine | Admitting: Emergency Medicine

## 2023-07-12 DIAGNOSIS — E119 Type 2 diabetes mellitus without complications: Secondary | ICD-10-CM | POA: Insufficient documentation

## 2023-07-12 DIAGNOSIS — M545 Low back pain, unspecified: Secondary | ICD-10-CM | POA: Diagnosis present

## 2023-07-12 DIAGNOSIS — Z794 Long term (current) use of insulin: Secondary | ICD-10-CM | POA: Diagnosis not present

## 2023-07-12 DIAGNOSIS — G5711 Meralgia paresthetica, right lower limb: Secondary | ICD-10-CM | POA: Diagnosis not present

## 2023-07-12 LAB — CBC WITH DIFFERENTIAL/PLATELET
Abs Immature Granulocytes: 0.07 10*3/uL (ref 0.00–0.07)
Basophils Absolute: 0.1 10*3/uL (ref 0.0–0.1)
Basophils Relative: 1 %
Eosinophils Absolute: 0.2 10*3/uL (ref 0.0–0.5)
Eosinophils Relative: 2 %
HCT: 35.7 % — ABNORMAL LOW (ref 36.0–46.0)
Hemoglobin: 11.4 g/dL — ABNORMAL LOW (ref 12.0–15.0)
Immature Granulocytes: 1 %
Lymphocytes Relative: 20 %
Lymphs Abs: 1.9 10*3/uL (ref 0.7–4.0)
MCH: 27 pg (ref 26.0–34.0)
MCHC: 31.9 g/dL (ref 30.0–36.0)
MCV: 84.4 fL (ref 80.0–100.0)
Monocytes Absolute: 1.1 10*3/uL — ABNORMAL HIGH (ref 0.1–1.0)
Monocytes Relative: 12 %
Neutro Abs: 6.1 10*3/uL (ref 1.7–7.7)
Neutrophils Relative %: 64 %
Platelets: 369 10*3/uL (ref 150–400)
RBC: 4.23 MIL/uL (ref 3.87–5.11)
RDW: 13.6 % (ref 11.5–15.5)
WBC: 9.4 10*3/uL (ref 4.0–10.5)
nRBC: 0 % (ref 0.0–0.2)

## 2023-07-12 LAB — COMPREHENSIVE METABOLIC PANEL WITH GFR
ALT: 26 U/L (ref 0–44)
AST: 16 U/L (ref 15–41)
Albumin: 3.8 g/dL (ref 3.5–5.0)
Alkaline Phosphatase: 247 U/L — ABNORMAL HIGH (ref 38–126)
Anion gap: 12 (ref 5–15)
BUN: 12 mg/dL (ref 8–23)
CO2: 24 mmol/L (ref 22–32)
Calcium: 9.1 mg/dL (ref 8.9–10.3)
Chloride: 95 mmol/L — ABNORMAL LOW (ref 98–111)
Creatinine, Ser: 0.84 mg/dL (ref 0.44–1.00)
GFR, Estimated: >60 mL/min (ref >60)
Glucose, Bld: 89 mg/dL (ref 70–99)
Potassium: 4.3 mmol/L (ref 3.5–5.1)
Sodium: 131 mmol/L — ABNORMAL LOW (ref 135–145)
Total Bilirubin: 0.9 mg/dL (ref 0.0–1.2)
Total Protein: 7.7 g/dL (ref 6.5–8.1)

## 2023-07-12 LAB — URINALYSIS, ROUTINE W REFLEX MICROSCOPIC
Bacteria, UA: NONE SEEN
Bilirubin Urine: NEGATIVE
Glucose, UA: >=500 mg/dL — AB
Hgb urine dipstick: NEGATIVE
Ketones, ur: 20 mg/dL — AB
Leukocytes,Ua: NEGATIVE
Nitrite: NEGATIVE
Protein, ur: NEGATIVE mg/dL
Specific Gravity, Urine: 1.007 (ref 1.005–1.030)
pH: 5 (ref 5.0–8.0)

## 2023-07-12 MED ORDER — SODIUM CHLORIDE 0.9 % IV BOLUS
1000.0000 mL | Freq: Once | INTRAVENOUS | Status: AC
  Administered 2023-07-12: 1000 mL via INTRAVENOUS

## 2023-07-12 MED ORDER — DIAZEPAM 2 MG PO TABS
2.0000 mg | ORAL_TABLET | Freq: Once | ORAL | Status: AC
  Administered 2023-07-12: 2 mg via ORAL
  Filled 2023-07-12: qty 1

## 2023-07-12 MED ORDER — ALUM & MAG HYDROXIDE-SIMETH 200-200-20 MG/5ML PO SUSP
30.0000 mL | Freq: Once | ORAL | Status: AC
  Administered 2023-07-12: 30 mL via ORAL
  Filled 2023-07-12: qty 30

## 2023-07-12 MED ORDER — MORPHINE SULFATE (PF) 4 MG/ML IV SOLN
4.0000 mg | Freq: Once | INTRAVENOUS | Status: AC
  Administered 2023-07-12: 4 mg via INTRAVENOUS
  Filled 2023-07-12: qty 1

## 2023-07-12 MED ORDER — DICLOFENAC SODIUM 1 % EX GEL: 4.0000 g | Freq: Four times a day (QID) | CUTANEOUS | 0 refills | Status: AC

## 2023-07-12 MED ORDER — ONDANSETRON HCL 4 MG/2ML IJ SOLN
4.0000 mg | Freq: Once | INTRAMUSCULAR | Status: AC
  Administered 2023-07-12: 4 mg via INTRAVENOUS
  Filled 2023-07-12: qty 2

## 2023-07-12 MED ORDER — METHYLPREDNISOLONE 4 MG PO TBPK: ORAL_TABLET | ORAL | 0 refills | Status: DC

## 2023-07-12 NOTE — ED Provider Notes (Signed)
 [place] [facility] AT [facility] Provider Note   CSN: [id number] Arrival date & time: 07/12/23  1939     History  Chief Complaint  Patient presents with   Back Pain    Angela Burch is a 63 y.o. female.  63 yo F with a cc of right-sided low back pain that radiates around to the anterior aspect of the right thigh.  This been going on for couple days.  Patient was just in the hospital with diabetic ketoacidosis.  She unfortunately since then has been having this pain and has not really been able to get up and move around.  She has some urinary hesitancy but denies dysuria or frequency.  She denies loss of bowel or bladder.  Denies loss of sensation denies numbness or weakness to the leg.  Pain seems to be worse with twisting turning.  She denies trauma to the area.   Back Pain      Home Medications Prior to Admission medications   Medication Sig Start Date End Date Taking? Authorizing Provider  diclofenac Sodium (VOLTAREN) 1 % GEL Apply 4 g topically 4 (four) times daily. 07/12/23  Yes Daegen Berrocal, DO  methylPREDNISolone (MEDROL DOSEPAK) 4 MG TBPK tablet Day 1: 8mg  before breakfast, 4 mg after lunch, 4 mg after supper, and 8 mg at bedtime Day 2: 4 mg before breakfast, 4 mg after lunch, 4 mg  after supper, and 8 mg  at bedtime Day 3:  4 mg  before breakfast, 4 mg  after lunch, 4 mg after supper, and 4 mg  at bedtime Day 4: 4 mg  before breakfast, 4 mg  after lunch, and 4 mg at bedtime Day 5: 4 mg  before breakfast and 4 mg at bedtime Day 6: 4 mg  before breakfast 07/12/23  Yes Lonnetta Kniskern, DO  amphetamine -dextroamphetamine  (ADDERALL) 30 MG tablet Take 1 tablet by mouth daily. Patient taking differently: Take 30 mg by mouth daily as needed (Patient does not have insurance which leads her to take her meds intermittently to save money). 08/02/16   Jamas Maywood, PA-C  buPROPion  (WELLBUTRIN  XL) 150 MG 24 hr tablet Take 150 mg by mouth daily as needed (Patient does  not have insurance which leads her to take her meds intermittently to save money). 05/13/17   [provider]  clonazePAM  (KLONOPIN ) 1 MG tablet Take 1 mg by mouth 2 (two) times daily as needed for anxiety. 03/18/17   [provider]  diltiazem  (CARDIZEM  LA) 180 MG 24 hr tablet Take 180 mg by mouth daily as needed (Patient does not have insurance which leads her to take her meds intermittently to save money).    [provider]  gabapentin  (NEURONTIN ) 300 MG capsule Take 300 mg by mouth daily as needed (Patient does not have insurance which leads her to take her meds intermittently to save money.). 12/28/22   [provider]  Insulin  Glargine (BASAGLAR  KWIKPEN) 100 UNIT/ML Inject 35 Units into the skin daily. 07/10/23 08/22/23  Unk Garb, DO  insulin  lispro (HUMALOG) 100 UNIT/ML KwikPen Inject 3 Units into the skin with breakfast, with lunch, and with evening meal. Plus SSI: CBG 70 - 120: 0 units CBG 121 - 150: 2 units CBG 151 - 200: 3 units CBG 201 - 250: 5 units CBG 251 - 300: 8 units CBG 301 - 350: 11 units CBG 351 - 400: 15 units 07/10/23   Unk Garb, DO  Insulin  Pen Needle (BD PEN  NEEDLE MINI ULTRAFINE) 31G X 5 MM MISC use to inject insulin  as directed 07/10/23   Unk Garb, DO  JARDIANCE  10 MG TABS tablet Take 10 mg by mouth daily as needed (Patient does not have insurance which leads her to take her meds intermittently to save money). 11/23/22   [provider]  lisinopril  (ZESTRIL ) 10 MG tablet Take 1 tablet (10 mg total) by mouth daily. 07/10/23 10/08/23  Unk Garb, DO  omeprazole (PRILOSEC) 40 MG capsule Take 40 mg by mouth daily as needed (Patient does not have insurance which leads her to take her meds intermittently to save money). 12/01/22   [provider]  PARoxetine  (PAXIL ) 40 MG tablet Take 40 mg by mouth daily as needed (Patient does not have insurance which leads her to take her meds intermittently to save money). 07/13/22   [provider]  rosuvastatin  (CRESTOR ) 10 MG tablet Take 10 mg by mouth daily as needed (Patient does not have insurance which leads her to take her meds intermittently to save money). 12/02/22   [provider]      Allergies    Sulfa antibiotics and Metformin  and related    Review of Systems   Review of Systems  Musculoskeletal:  Positive for back pain.    Physical Exam Updated Vital Signs BP 126/63   Pulse 93   Temp 98.4 F (36.9 C)   Resp 18   SpO2 95%  Physical Exam Vitals and nursing note reviewed.  Constitutional:      General: She is not in acute distress.    Appearance: She is well-developed. She is not diaphoretic.  HENT:     Head: Normocephalic and atraumatic.     Left Ear: Tympanic membrane normal.  Eyes:     Pupils: Pupils are equal, round, and reactive to light.  Cardiovascular:     Rate and Rhythm: Normal rate and regular rhythm.     Heart sounds: No murmur heard.    No friction rub. No gallop.  Pulmonary:     Effort: Pulmonary effort is normal.     Breath sounds: No wheezing or rales.  Abdominal:     General: There is no distension.     Palpations: Abdomen is soft.     Tenderness: There is no abdominal tenderness.  Musculoskeletal:        General: No tenderness.     Cervical back: Normal range of motion and neck supple.     Comments: No obvious midline spinal tenderness step-offs deformities.  Pulse motor and sensation intact to the right lower extremity.  Reflexes are 2+ and equal.  There is no clonus.  Negative Babinski.  Negative straight leg raise test.  Skin:    General: Skin is warm and dry.  Neurological:     Mental Status: She is alert and oriented to person, place, and time.  Psychiatric:        Behavior: Behavior normal.     ED Results / Procedures / Treatments   Labs (all labs ordered are listed, but only abnormal results are displayed) Labs Reviewed  CBC WITH DIFFERENTIAL/PLATELET - Abnormal; Notable for the following  components:      Result Value   Hemoglobin 11.4 (*)    HCT 35.7 (*)    Monocytes Absolute 1.1 (*)    All other components within normal limits  COMPREHENSIVE METABOLIC PANEL WITH GFR - Abnormal; Notable for the following components:   Sodium 131 (*)    Chloride 95 (*)  Alkaline Phosphatase 247 (*)    All other components within normal limits  URINALYSIS, ROUTINE W REFLEX MICROSCOPIC - Abnormal; Notable for the following components:   Glucose, UA >=500 (*)    Ketones, ur 20 (*)    All other components within normal limits    EKG None  Radiology CT L-SPINE NO CHARGE Result Date: 07/12/2023 CLINICAL DATA:  From home, bi lat lower back/flank pain onset 2 days. Urine output decreased. Aaox4. Hx lupus. EXAM: CT LUMBAR SPINE WITHOUT CONTRAST TECHNIQUE: Multidetector CT imaging of the lumbar spine was performed without intravenous contrast administration. Multiplanar CT image reconstructions were also generated. RADIATION DOSE REDUCTION: This exam was performed according to the departmental dose-optimization program which includes automated exposure control, adjustment of the mA and/or kV according to patient size and/or use of iterative reconstruction technique. COMPARISON:  CT lumbar spine 01/03/2023 FINDINGS: Segmentation: 5 non-rib-bearing lumbar vertebral bodies. Alignment: Stable grade 1 anterolisthesis of L4 on L5. Vertebrae: Multilevel mild to moderate facet arthropathy. No acute fracture or focal pathologic process. Paraspinal and other soft tissues: Negative. Disc levels: Maintained. Other: Atherosclerotic plaque. Please see separately dictated CT abdomen pelvis 07/12/2023. IMPRESSION: No acute displaced fracture or traumatic listhesis of the lumbar spine. Electronically Signed   By: Morgane  Naveau M.D.   On: 07/12/2023 21:46   CT Renal Stone Study Result Date: 07/12/2023 CLINICAL DATA:  Abdominal/flank pain, stone suspected EXAM: CT ABDOMEN AND PELVIS WITHOUT CONTRAST TECHNIQUE:  Multidetector CT imaging of the abdomen and pelvis was performed following the standard protocol without IV contrast. RADIATION DOSE REDUCTION: This exam was performed according to the departmental dose-optimization program which includes automated exposure control, adjustment of the mA and/or kV according to patient size and/or use of iterative reconstruction technique. COMPARISON:  CT abdomen pelvis 07/09/2023 FINDINGS: Lower chest: Improved aeration of the bilateral lower lobes. Bilateral lower lobar architectural distortion, scarring, and bronchiectasis formation. Tiny hiatal hernia. Hepatobiliary: No focal liver abnormality. Status post cholecystectomy. No biliary dilatation. Pancreas: Diffusely atrophic. No focal lesion. Otherwise normal pancreatic contour. No surrounding inflammatory changes. No main pancreatic ductal dilatation. Spleen: Normal in size without focal abnormality. Adrenals/Urinary Tract: No adrenal nodule bilaterally. No nephrolithiasis and no hydronephrosis. No definite contour-deforming renal mass. No ureterolithiasis or hydroureter. The urinary bladder is unremarkable. Stomach/Bowel: Stomach is within normal limits. No evidence of bowel wall thickening or dilatation. Colonic diverticulosis. Stool throughout the ascending and transverse colon. The appendix is not definitely identified with no inflammatory changes in the right lower quadrant to suggest acute appendicitis. Vascular/Lymphatic: No abdominal aorta or iliac aneurysm. Mild atherosclerotic plaque of the aorta and its branches. No abdominal, pelvic, or inguinal lymphadenopathy. Reproductive: Uterus and bilateral adnexa are unremarkable. Other: No intraperitoneal free fluid. No intraperitoneal free gas. No organized fluid collection. Musculoskeletal: No abdominal wall hernia or abnormality. No suspicious lytic or blastic osseous lesions. No acute displaced fracture. Old healed left rib fractures. Please see separately dictated CT  lumbar spine 07/12/2023. IMPRESSION: 1. No acute intra-abdominal or intrapelvic abnormality. 2. Colonic diverticulosis with no acute diverticulitis. Electronically Signed   By: Morgane  Naveau M.D.   On: 07/12/2023 21:43    Procedures Procedures    Medications Ordered in ED Medications  sodium chloride  0.9 % bolus 1,000 mL (0 mLs Intravenous Stopped 07/12/23 2230)  morphine  (PF) 4 MG/ML injection 4 mg (4 mg Intravenous Given 07/12/23 2102)  ondansetron  (ZOFRAN ) injection 4 mg (4 mg Intravenous Given 07/12/23 2102)  diazepam  (VALIUM ) tablet 2 mg (2 mg Oral Given 07/12/23 2102)  alum & mag hydroxide-simeth (MAALOX/MYLANTA) 200-200-20 MG/5ML suspension 30 mL (30 mLs Oral Given 07/12/23 2224)    ED Course/ Medical Decision Making/ A&P                                 Medical Decision Making Amount and/or Complexity of Data Reviewed Labs: ordered. Radiology: ordered.  Risk OTC drugs. Prescription drug management.   63 yo F with a chief complaints of right-sided low back pain that radiates to the anterior aspect of the leg.  This has been going on for couple days.  Most likely this is radicular back pain.  Patient was just hospitalized for diabetic ketoacidosis.  Will repeat blood work here.  CT imaging to assess for intra-abdominal pathology.  Treat symptoms.  Reassess.  Patient feeling mildly better.    Lab work with perhaps mild dehydration.  No significant electrolyte abnormality otherwise.  No acute anemia.  CT abdomen pelvis without obvious intra-abdominal pathology.  CT L-spine without obvious acute finding.  I discussed the results with the patient.  Continue to wait for urine.  UA has been fluids and is negative for infection on my independent interpretation.  Will discharge the patient home.  PCP follow-up.  10:55 PM:  I have discussed the diagnosis/risks/treatment options with the patient and family.  Evaluation and diagnostic testing in the emergency department does not  suggest an emergent condition requiring admission or immediate intervention beyond what has been performed at this time.  They will follow up with PCP. We also discussed returning to the ED immediately if new or worsening sx occur. We discussed the sx which are most concerning (e.g., sudden worsening pain, fever, inability to tolerate by mouth, cauda equina s/sx) that necessitate immediate return. Medications administered to the patient during their visit and any new prescriptions provided to the patient are listed below.  Medications given during this visit Medications  sodium chloride  0.9 % bolus 1,000 mL (0 mLs Intravenous Stopped 07/12/23 2230)  morphine  (PF) 4 MG/ML injection 4 mg (4 mg Intravenous Given 07/12/23 2102)  ondansetron  (ZOFRAN ) injection 4 mg (4 mg Intravenous Given 07/12/23 2102)  diazepam  (VALIUM ) tablet 2 mg (2 mg Oral Given 07/12/23 2102)  alum & mag hydroxide-simeth (MAALOX/MYLANTA) 200-200-20 MG/5ML suspension 30 mL (30 mLs Oral Given 07/12/23 2224)     The patient appears reasonably screen and/or stabilized for discharge and I doubt any other medical condition or other [facility] requiring further screening, evaluation, or treatment in the ED at this time prior to discharge.          Final Clinical Impression(s) / ED Diagnoses Final diagnoses:  Meralgia paresthetica of right side    Rx / DC Orders ED Discharge Orders          Ordered    methylPREDNISolone (MEDROL DOSEPAK) 4 MG TBPK tablet        07/12/23 2238    diclofenac Sodium (VOLTAREN) 1 % GEL  4 times daily        07/12/23 2238              Florette Thai, DO 07/12/23 2255

## 2023-07-12 NOTE — Discharge Instructions (Signed)
Your back pain is most likely due to a muscular strain.  There is been a lot of research on back pain, unfortunately the only thing that seems to really help is Tylenol and ibuprofen.  Relative rest is also important to not lift greater than 10 pounds bending or twisting at the waist.  Please follow-up with your family physician.  The other thing that really seems to benefit patients is physical therapy which your doctor may send you for.  Please return to the emergency department for new numbness or weakness to your arms or legs. Difficulty with urinating or urinating or pooping on yourself.  Also if you cannot feel toilet paper when you wipe or get a fever.   Use the gel as prescribed.  Take the steroids as prescribed Also take tylenol 1000mg (2 extra strength) four times a day.

## 2023-07-12 NOTE — ED Triage Notes (Signed)
 From home, bi lat lower back/flank pain  onset 2 days. Urine output decreased. Aaox4. Hx lupus.

## 2024-01-23 ENCOUNTER — Inpatient Hospital Stay (HOSPITAL_COMMUNITY)
Admission: EM | Admit: 2024-01-23 | Discharge: 2024-01-26 | DRG: 557 | Disposition: A | Discharge status: Home Health | Attending: Internal Medicine | Admitting: Internal Medicine

## 2024-01-23 ENCOUNTER — Other Ambulatory Visit: Payer: Self-pay

## 2024-01-23 ENCOUNTER — Encounter (HOSPITAL_COMMUNITY): Payer: Self-pay | Admitting: Emergency Medicine

## 2024-01-23 ENCOUNTER — Emergency Department (HOSPITAL_COMMUNITY)

## 2024-01-23 DIAGNOSIS — M6282 Rhabdomyolysis: Principal | ICD-10-CM | POA: Diagnosis present

## 2024-01-23 DIAGNOSIS — Z888 Allergy status to other drugs, medicaments and biological substances status: Secondary | ICD-10-CM

## 2024-01-23 DIAGNOSIS — G8929 Other chronic pain: Secondary | ICD-10-CM | POA: Diagnosis present

## 2024-01-23 DIAGNOSIS — I4892 Unspecified atrial flutter: Secondary | ICD-10-CM | POA: Diagnosis not present

## 2024-01-23 DIAGNOSIS — E782 Mixed hyperlipidemia: Secondary | ICD-10-CM

## 2024-01-23 DIAGNOSIS — Z5941 Food insecurity: Secondary | ICD-10-CM

## 2024-01-23 DIAGNOSIS — Z7984 Long term (current) use of oral hypoglycemic drugs: Secondary | ICD-10-CM

## 2024-01-23 DIAGNOSIS — E860 Dehydration: Secondary | ICD-10-CM | POA: Diagnosis present

## 2024-01-23 DIAGNOSIS — E7849 Other hyperlipidemia: Secondary | ICD-10-CM | POA: Diagnosis present

## 2024-01-23 DIAGNOSIS — G9341 Metabolic encephalopathy: Secondary | ICD-10-CM | POA: Diagnosis present

## 2024-01-23 DIAGNOSIS — Z79899 Other long term (current) drug therapy: Secondary | ICD-10-CM

## 2024-01-23 DIAGNOSIS — Z87892 Personal history of anaphylaxis: Secondary | ICD-10-CM

## 2024-01-23 DIAGNOSIS — E1142 Type 2 diabetes mellitus with diabetic polyneuropathy: Secondary | ICD-10-CM | POA: Diagnosis present

## 2024-01-23 DIAGNOSIS — F17210 Nicotine dependence, cigarettes, uncomplicated: Secondary | ICD-10-CM | POA: Diagnosis present

## 2024-01-23 DIAGNOSIS — N179 Acute kidney failure, unspecified: Secondary | ICD-10-CM | POA: Diagnosis not present

## 2024-01-23 DIAGNOSIS — G928 Other toxic encephalopathy: Secondary | ICD-10-CM | POA: Diagnosis present

## 2024-01-23 DIAGNOSIS — J984 Other disorders of lung: Secondary | ICD-10-CM | POA: Diagnosis present

## 2024-01-23 DIAGNOSIS — R7401 Elevation of levels of liver transaminase levels: Secondary | ICD-10-CM | POA: Diagnosis present

## 2024-01-23 DIAGNOSIS — D72829 Elevated white blood cell count, unspecified: Secondary | ICD-10-CM | POA: Diagnosis present

## 2024-01-23 DIAGNOSIS — K2100 Gastro-esophageal reflux disease with esophagitis, without bleeding: Secondary | ICD-10-CM | POA: Diagnosis not present

## 2024-01-23 DIAGNOSIS — F419 Anxiety disorder, unspecified: Secondary | ICD-10-CM | POA: Diagnosis present

## 2024-01-23 DIAGNOSIS — J9811 Atelectasis: Secondary | ICD-10-CM | POA: Diagnosis present

## 2024-01-23 DIAGNOSIS — Z791 Long term (current) use of non-steroidal anti-inflammatories (NSAID): Secondary | ICD-10-CM

## 2024-01-23 DIAGNOSIS — Z8673 Personal history of transient ischemic attack (TIA), and cerebral infarction without residual deficits: Secondary | ICD-10-CM

## 2024-01-23 DIAGNOSIS — Z794 Long term (current) use of insulin: Secondary | ICD-10-CM

## 2024-01-23 DIAGNOSIS — Z1152 Encounter for screening for COVID-19: Secondary | ICD-10-CM

## 2024-01-23 DIAGNOSIS — W19XXXA Unspecified fall, initial encounter: Secondary | ICD-10-CM | POA: Diagnosis present

## 2024-01-23 DIAGNOSIS — R410 Disorientation, unspecified: Secondary | ICD-10-CM

## 2024-01-23 DIAGNOSIS — I10 Essential (primary) hypertension: Secondary | ICD-10-CM | POA: Diagnosis not present

## 2024-01-23 DIAGNOSIS — Z604 Social exclusion and rejection: Secondary | ICD-10-CM | POA: Diagnosis present

## 2024-01-23 DIAGNOSIS — R5381 Other malaise: Secondary | ICD-10-CM | POA: Diagnosis present

## 2024-01-23 DIAGNOSIS — E119 Type 2 diabetes mellitus without complications: Secondary | ICD-10-CM

## 2024-01-23 DIAGNOSIS — Z9049 Acquired absence of other specified parts of digestive tract: Secondary | ICD-10-CM

## 2024-01-23 DIAGNOSIS — Y92092 Bedroom in other non-institutional residence as the place of occurrence of the external cause: Secondary | ICD-10-CM

## 2024-01-23 DIAGNOSIS — E1169 Type 2 diabetes mellitus with other specified complication: Secondary | ICD-10-CM | POA: Diagnosis present

## 2024-01-23 DIAGNOSIS — J479 Bronchiectasis, uncomplicated: Secondary | ICD-10-CM | POA: Diagnosis present

## 2024-01-23 DIAGNOSIS — Z882 Allergy status to sulfonamides status: Secondary | ICD-10-CM

## 2024-01-23 LAB — CBC WITH DIFFERENTIAL/PLATELET
Abs Immature Granulocytes: 0.12 K/uL — ABNORMAL HIGH (ref 0.00–0.07)
Basophils Absolute: 0.1 K/uL (ref 0.0–0.1)
Basophils Relative: 1 %
Eosinophils Absolute: 0.4 K/uL (ref 0.0–0.5)
Eosinophils Relative: 3 %
HCT: 36.1 % (ref 36.0–46.0)
Hemoglobin: 11.4 g/dL — ABNORMAL LOW (ref 12.0–15.0)
Immature Granulocytes: 1 %
Lymphocytes Relative: 9 %
Lymphs Abs: 1.5 K/uL (ref 0.7–4.0)
MCH: 26.1 pg (ref 26.0–34.0)
MCHC: 31.6 g/dL (ref 30.0–36.0)
MCV: 82.6 fL (ref 80.0–100.0)
Monocytes Absolute: 1.9 K/uL — ABNORMAL HIGH (ref 0.1–1.0)
Monocytes Relative: 11 %
Neutro Abs: 13.1 K/uL — ABNORMAL HIGH (ref 1.7–7.7)
Neutrophils Relative %: 75 %
Platelets: 280 K/uL (ref 150–400)
RBC: 4.37 MIL/uL (ref 3.87–5.11)
RDW: 14.7 % (ref 11.5–15.5)
WBC: 17.2 K/uL — ABNORMAL HIGH (ref 4.0–10.5)
nRBC: 0 % (ref 0.0–0.2)

## 2024-01-23 LAB — ACETAMINOPHEN LEVEL: Acetaminophen (Tylenol), Serum: <10 ug/mL — ABNORMAL LOW (ref 10–30)

## 2024-01-23 LAB — CK: Total CK: 2257 U/L — ABNORMAL HIGH (ref 38–234)

## 2024-01-23 LAB — URINALYSIS, ROUTINE W REFLEX MICROSCOPIC
Bilirubin Urine: NEGATIVE
Glucose, UA: >=500 mg/dL — AB
Hgb urine dipstick: NEGATIVE
Ketones, ur: NEGATIVE mg/dL
Leukocytes,Ua: NEGATIVE
Nitrite: NEGATIVE
Protein, ur: 30 mg/dL — AB
Specific Gravity, Urine: 1.017 (ref 1.005–1.030)
pH: 5 (ref 5.0–8.0)

## 2024-01-23 LAB — COMPREHENSIVE METABOLIC PANEL WITH GFR
ALT: 50 U/L — ABNORMAL HIGH (ref 0–44)
AST: 65 U/L — ABNORMAL HIGH (ref 15–41)
Albumin: 3.3 g/dL — ABNORMAL LOW (ref 3.5–5.0)
Alkaline Phosphatase: 180 U/L — ABNORMAL HIGH (ref 38–126)
Anion gap: 14 (ref 5–15)
BUN: 48 mg/dL — ABNORMAL HIGH (ref 8–23)
CO2: 21 mmol/L — ABNORMAL LOW (ref 22–32)
Calcium: 8 mg/dL — ABNORMAL LOW (ref 8.9–10.3)
Chloride: 98 mmol/L (ref 98–111)
Creatinine, Ser: 5.81 mg/dL — ABNORMAL HIGH (ref 0.44–1.00)
GFR, Estimated: 8 mL/min — ABNORMAL LOW (ref >60)
Glucose, Bld: 129 mg/dL — ABNORMAL HIGH (ref 70–99)
Potassium: 4.8 mmol/L (ref 3.5–5.1)
Sodium: 133 mmol/L — ABNORMAL LOW (ref 135–145)
Total Bilirubin: 1.3 mg/dL — ABNORMAL HIGH (ref 0.0–1.2)
Total Protein: 6.5 g/dL (ref 6.5–8.1)

## 2024-01-23 LAB — RENAL FUNCTION PANEL
Albumin: 2.8 g/dL — ABNORMAL LOW (ref 3.5–5.0)
Anion gap: 10 (ref 5–15)
BUN: 43 mg/dL — ABNORMAL HIGH (ref 8–23)
CO2: 24 mmol/L (ref 22–32)
Calcium: 8.1 mg/dL — ABNORMAL LOW (ref 8.9–10.3)
Chloride: 98 mmol/L (ref 98–111)
Creatinine, Ser: 3.41 mg/dL — ABNORMAL HIGH (ref 0.44–1.00)
GFR, Estimated: 14 mL/min — ABNORMAL LOW (ref >60)
Glucose, Bld: 389 mg/dL — ABNORMAL HIGH (ref 70–99)
Phosphorus: 3.7 mg/dL (ref 2.5–4.6)
Potassium: 4.3 mmol/L (ref 3.5–5.1)
Sodium: 132 mmol/L — ABNORMAL LOW (ref 135–145)

## 2024-01-23 LAB — RESP PANEL BY RT-PCR (RSV, FLU A&B, COVID)  RVPGX2
Influenza A by PCR: NEGATIVE
Influenza B by PCR: NEGATIVE
Resp Syncytial Virus by PCR: NEGATIVE
SARS Coronavirus 2 by RT PCR: NEGATIVE

## 2024-01-23 LAB — CBG MONITORING, ED
Glucose-Capillary: 122 mg/dL — ABNORMAL HIGH (ref 70–99)
Glucose-Capillary: 341 mg/dL — ABNORMAL HIGH (ref 70–99)

## 2024-01-23 LAB — RAPID URINE DRUG SCREEN, HOSP PERFORMED
Amphetamines: POSITIVE — AB
Barbiturates: NOT DETECTED
Benzodiazepines: NOT DETECTED
Cocaine: NOT DETECTED
Opiates: POSITIVE — AB
Tetrahydrocannabinol: POSITIVE — AB

## 2024-01-23 LAB — GLUCOSE, CAPILLARY: Glucose-Capillary: 329 mg/dL — ABNORMAL HIGH (ref 70–99)

## 2024-01-23 LAB — SALICYLATE LEVEL: Salicylate Lvl: <7 mg/dL — ABNORMAL LOW (ref 7.0–30.0)

## 2024-01-23 LAB — ETHANOL: Alcohol, Ethyl (B): <15 mg/dL (ref <15)

## 2024-01-23 MED ORDER — LACTATED RINGERS IV SOLN: INTRAVENOUS | Status: AC

## 2024-01-23 MED ORDER — HEPARIN SODIUM (PORCINE) 5000 UNIT/ML IJ SOLN
5000.0000 [IU] | Freq: Three times a day (TID) | INTRAMUSCULAR | Status: DC
  Administered 2024-01-23 – 2024-01-26 (×9): 5000 [IU] via SUBCUTANEOUS
  Filled 2024-01-23 (×9): qty 1

## 2024-01-23 MED ORDER — ACETAMINOPHEN 325 MG PO TABS: 650.0000 mg | ORAL_TABLET | Freq: Four times a day (QID) | ORAL | Status: DC | PRN

## 2024-01-23 MED ORDER — SODIUM CHLORIDE 0.9% FLUSH
3.0000 mL | Freq: Two times a day (BID) | INTRAVENOUS | Status: DC
  Administered 2024-01-23 – 2024-01-25 (×5): 3 mL via INTRAVENOUS

## 2024-01-23 MED ORDER — SODIUM CHLORIDE 0.9 % IV BOLUS
1000.0000 mL | Freq: Once | INTRAVENOUS | Status: AC
  Administered 2024-01-23: 1000 mL via INTRAVENOUS

## 2024-01-23 MED ORDER — GABAPENTIN 300 MG PO CAPS
300.0000 mg | ORAL_CAPSULE | Freq: Every day | ORAL | Status: DC | PRN
  Administered 2024-01-23 – 2024-01-25 (×3): 300 mg via ORAL
  Filled 2024-01-23 (×3): qty 1

## 2024-01-23 MED ORDER — INSULIN ASPART 100 UNIT/ML IJ SOLN
0.0000 [IU] | Freq: Three times a day (TID) | INTRAMUSCULAR | Status: DC
  Administered 2024-01-23: 7 [IU] via SUBCUTANEOUS
  Administered 2024-01-24: 5 [IU] via SUBCUTANEOUS
  Administered 2024-01-24: 7 [IU] via SUBCUTANEOUS
  Administered 2024-01-24: 3 [IU] via SUBCUTANEOUS
  Administered 2024-01-25: 7 [IU] via SUBCUTANEOUS
  Administered 2024-01-25 – 2024-01-26 (×3): 2 [IU] via SUBCUTANEOUS
  Filled 2024-01-23 (×2): qty 2
  Filled 2024-01-23: qty 1
  Filled 2024-01-23: qty 7
  Filled 2024-01-23 (×2): qty 2
  Filled 2024-01-23: qty 7

## 2024-01-23 MED ORDER — ACETAMINOPHEN 650 MG RE SUPP: 650.0000 mg | Freq: Four times a day (QID) | RECTAL | Status: DC | PRN

## 2024-01-23 MED ORDER — LACTATED RINGERS IV BOLUS
1000.0000 mL | Freq: Once | INTRAVENOUS | Status: AC
  Administered 2024-01-23: 1000 mL via INTRAVENOUS

## 2024-01-23 MED ORDER — POLYETHYLENE GLYCOL 3350 17 G PO PACK: 17.0000 g | PACK | Freq: Every day | ORAL | Status: DC | PRN

## 2024-01-23 MED ORDER — CLONAZEPAM 1 MG PO TABS
1.0000 mg | ORAL_TABLET | Freq: Two times a day (BID) | ORAL | Status: DC | PRN
  Administered 2024-01-23 – 2024-01-25 (×5): 1 mg via ORAL
  Filled 2024-01-23 (×5): qty 1

## 2024-01-23 MED ORDER — INSULIN GLARGINE-YFGN 100 UNIT/ML ~~LOC~~ SOLN
20.0000 [IU] | Freq: Every day | SUBCUTANEOUS | Status: DC
  Administered 2024-01-24 – 2024-01-26 (×3): 20 [IU] via SUBCUTANEOUS
  Filled 2024-01-23 (×3): qty 0.2

## 2024-01-23 NOTE — ED Notes (Signed)
 C-collar removed per MD Pamella verbal order

## 2024-01-23 NOTE — ED Provider Notes (Signed)
 [place] EMERGENCY DEPARTMENT AT [facility] Provider Note   CSN: [id number] Arrival date & time: 01/23/24  9085     Patient presents with: Angela Burch is a 63 y.o. female.  With a history of hypertension type 2 diabetes who presents to the ED via EMS after reported fall.  Daughter called EMS after finding the patient on the floor sitting upright next to her bed.  Patient is unable to recall why she was on the floor.  Daughter also mention to EMS patient exhibited slurred speech yesterday morning around 0700.  No other deficits reported.  Upon arrival patient is reporting headache and lower back pain.  She does report taking an opioid analgesic over the last couple days at home due to pain.    Fall       Prior to Admission medications   Medication Sig Start Date End Date Taking? Authorizing Provider  amphetamine -dextroamphetamine  (ADDERALL) 30 MG tablet Take 1 tablet by mouth daily. Patient taking differently: Take 30 mg by mouth daily as needed (Patient does not have insurance which leads her to take her meds intermittently to save money). 08/02/16   Kristy Sharolyn Lenis, PA-C  buPROPion  (WELLBUTRIN  XL) 150 MG 24 hr tablet Take 150 mg by mouth daily as needed (Patient does not have insurance which leads her to take her meds intermittently to save money). 05/13/17   [provider]  clonazePAM  (KLONOPIN ) 1 MG tablet Take 1 mg by mouth 2 (two) times daily as needed for anxiety. 03/18/17   [provider]  diclofenac  Sodium (VOLTAREN ) 1 % GEL Apply 4 g topically 4 (four) times daily. 07/12/23   Emil Share, DO  diltiazem  (CARDIZEM  LA) 180 MG 24 hr tablet Take 180 mg by mouth daily as needed (Patient does not have insurance which leads her to take her meds intermittently to save money).    [provider]  gabapentin  (NEURONTIN ) 300 MG capsule Take 300 mg by mouth daily as needed (Patient does not have insurance which leads her to take her meds  intermittently to save money.). 12/28/22   [provider]  Insulin  Glargine (BASAGLAR  KWIKPEN) 100 UNIT/ML Inject 35 Units into the skin daily. 07/10/23 08/22/23  Laurence Locus, DO  insulin  lispro (HUMALOG ) 100 UNIT/ML KwikPen Inject 3 Units into the skin with breakfast, with lunch, and with evening meal. Plus SSI: CBG 70 - 120: 0 units CBG 121 - 150: 2 units CBG 151 - 200: 3 units CBG 201 - 250: 5 units CBG 251 - 300: 8 units CBG 301 - 350: 11 units CBG 351 - 400: 15 units 07/10/23   Laurence Locus, DO  Insulin  Pen Needle (BD PEN NEEDLE MINI ULTRAFINE) 31G X 5 MM MISC use to inject insulin  as directed 07/10/23   Laurence Locus, DO  JARDIANCE  10 MG TABS tablet Take 10 mg by mouth daily as needed (Patient does not have insurance which leads her to take her meds intermittently to save money). 11/23/22   [provider]  lisinopril  (ZESTRIL ) 10 MG tablet Take 1 tablet (10 mg total) by mouth daily. 07/10/23 10/08/23  Laurence Locus, DO  methylPREDNISolone  (MEDROL  DOSEPAK) 4 MG TBPK tablet Day 1: 8mg  before breakfast, 4 mg after lunch, 4 mg after supper, and 8 mg at bedtime Day 2: 4 mg before breakfast, 4 mg after lunch, 4 mg  after supper, and 8 mg  at bedtime Day 3:  4 mg  before breakfast, 4 mg  after  lunch, 4 mg after supper, and 4 mg  at bedtime Day 4: 4 mg  before breakfast, 4 mg  after lunch, and 4 mg at bedtime Day 5: 4 mg  before breakfast and 4 mg at bedtime Day 6: 4 mg  before breakfast 07/12/23   Floyd, Dan, DO  omeprazole (PRILOSEC) 40 MG capsule Take 40 mg by mouth daily as needed (Patient does not have insurance which leads her to take her meds intermittently to save money). 12/01/22   [provider]  PARoxetine  (PAXIL ) 40 MG tablet Take 40 mg by mouth daily as needed (Patient does not have insurance which leads her to take her meds intermittently to save money). 07/13/22   [provider]  rosuvastatin  (CRESTOR ) 10 MG tablet Take 10 mg by mouth daily as needed (Patient does not  have insurance which leads her to take her meds intermittently to save money). 12/02/22   [provider]    Allergies: Sulfa antibiotics and Metformin  and related    Review of Systems  Updated Vital Signs BP (!) 111/56   Pulse 100   Temp 97.9 F (36.6 C) (Oral)   Resp (!) 26   SpO2 96%   Physical Exam Vitals and nursing note reviewed.  HENT:     Head: Normocephalic and atraumatic.     Mouth/Throat:     Mouth: Mucous membranes are dry.  Eyes:     Extraocular Movements: Extraocular movements intact.     Pupils: Pupils are equal, round, and reactive to light.  Neck:     Comments: C-collar in place exam deferred Cardiovascular:     Rate and Rhythm: Normal rate and regular rhythm.  Pulmonary:     Effort: Pulmonary effort is normal.     Breath sounds: Normal breath sounds.  Abdominal:     Palpations: Abdomen is soft.     Tenderness: There is no abdominal tenderness.  Musculoskeletal:        General: No tenderness.     Cervical back: Neck supple.     Comments: Midline and paraspinal lumbar tenderness No step-off deformity Full active range of motion bilateral upper and lower extremities without tenderness  Skin:    General: Skin is warm and dry.  Neurological:     General: No focal deficit present.     Mental Status: She is alert.     Sensory: No sensory deficit.     Motor: No weakness.     Comments: Clear fluent speech  Psychiatric:        Mood and Affect: Mood normal.     (all labs ordered are listed, but only abnormal results are displayed) Labs Reviewed  COMPREHENSIVE METABOLIC PANEL WITH GFR - Abnormal; Notable for the following components:      Result Value   Sodium 133 (*)    CO2 21 (*)    Glucose, Bld 129 (*)    BUN 48 (*)    Creatinine, Ser 5.81 (*)    Calcium  8.0 (*)    Albumin 3.3 (*)    AST 65 (*)    ALT 50 (*)    Alkaline Phosphatase 180 (*)    Total Bilirubin 1.3 (*)    GFR, Estimated 8 (*)    All other components within normal  limits  CBC WITH DIFFERENTIAL/PLATELET - Abnormal; Notable for the following components:   WBC 17.2 (*)    Hemoglobin 11.4 (*)    Neutro Abs 13.1 (*)    Monocytes Absolute 1.9 (*)  Abs Immature Granulocytes 0.12 (*)    All other components within normal limits  ACETAMINOPHEN  LEVEL - Abnormal; Notable for the following components:   Acetaminophen  (Tylenol ), Serum <10 (*)    All other components within normal limits  SALICYLATE LEVEL - Abnormal; Notable for the following components:   Salicylate Lvl <7.0 (*)    All other components within normal limits  RAPID URINE DRUG SCREEN, HOSP PERFORMED - Abnormal; Notable for the following components:   Opiates POSITIVE (*)    Amphetamines POSITIVE (*)    Tetrahydrocannabinol POSITIVE (*)    All other components within normal limits  CK - Abnormal; Notable for the following components:   Total CK 2,257 (*)    All other components within normal limits  CBG MONITORING, ED - Abnormal; Notable for the following components:   Glucose-Capillary 122 (*)    All other components within normal limits  RESP PANEL BY RT-PCR (RSV, FLU A&B, COVID)  RVPGX2  ETHANOL  URINALYSIS, ROUTINE W REFLEX MICROSCOPIC  HIV ANTIBODY (ROUTINE TESTING W REFLEX)    EKG: EKG Interpretation Date/Time:  Thursday January 23 2024 09:35:00 EST Ventricular Rate:  89 PR Interval:  149 QRS Duration:  81 QT Interval:  357 QTC Calculation: 435 R Axis:   -31  Text Interpretation: Sinus rhythm Left axis deviation Low voltage, precordial leads Abnormal R-wave progression, late transition Confirmed by Pamella Sharper [phone]) on 01/23/2024 11:37:47 AM  Radiology: DG Chest Portable 1 View Result Date: 01/23/2024 EXAM: 1 VIEW(S) XRAY OF THE CHEST 01/23/2024 12:37:00 PM COMPARISON: 06/10/2016 CLINICAL HISTORY: ?pna pna FINDINGS: LUNGS AND PLEURA: Minimal left lateral basilar subsegmental atelectasis or scarring is noted. No pleural effusion. No pneumothorax. HEART AND MEDIASTINUM:  No acute abnormality of the cardiac and mediastinal silhouettes. BONES AND SOFT TISSUES: No acute osseous abnormality. IMPRESSION: 1. Minimal left lateral basilar subsegmental atelectasis or scarring. Electronically signed by: Lynwood Seip MD 01/23/2024 01:27 PM EST RP Workstation: [id number]   CT Cervical Spine Wo Contrast Result Date: 01/23/2024 EXAM: CT CERVICAL SPINE WITHOUT CONTRAST 01/23/2024 10:25:00 AM TECHNIQUE: CT of the cervical spine was performed without the administration of intravenous contrast. Multiplanar reformatted images are provided for review. Automated exposure control, iterative reconstruction, and/or weight based adjustment of the mA/kV was utilized to reduce the radiation dose to as low as reasonably achievable. COMPARISON: CT head reported separately today. CLINICAL HISTORY: Female patient. Fall, found down. FINDINGS: CERVICAL SPINE: BONES AND ALIGNMENT: No acute traumatic injury identified in the cervical spine. No acute fracture or traumatic malalignment. Relatively preserved cervical lordosis. Mild visible upper thoracic scoliosis. Visible upper thoracic levels appear grossly intact. DEGENERATIVE CHANGES: Cervical spine degeneration including degenerative facet ankylosis on the left at C4-C5. Bulky and partially calcified ligamentous degenerative hypertrophy about the odontoid. SOFT TISSUES: No prevertebral soft tissue swelling. Calcified cervical carotid atherosclerosis. Otherwise negative visible non-contrast neck soft tissues. Carious posterior left mandible molar. Non-contrast visible upper chest remarkable for calcified coronary artery and aortic atherosclerosis. IMPRESSION: 1. No acute traumatic injury identified in the cervical spine. 2. 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 Electronically signed by: Helayne Hurst MD 01/23/2024 10:47 AM EST RP Workstation: [id number]   CT Lumbar Spine Wo Contrast Result Date:  01/23/2024 EXAM: CT OF THE LUMBAR SPINE WITHOUT CONTRAST 01/23/2024 10:25:00 AM TECHNIQUE: CT of the lumbar spine was performed without the administration of intravenous contrast. Multiplanar reformatted images are provided for review. Automated exposure control, iterative reconstruction, and/or weight based adjustment of the  mA/kV was utilized to reduce the radiation dose to as low as reasonably achievable. COMPARISON: Lumbar spine CT 06/12/2023. CLINICAL HISTORY: 63 year old female status post fall, found down. FINDINGS: Normal lumbar segmentation. BONES AND ALIGNMENT: Chronic generalized osteopenia. Normal vertebral body heights. Stable vertebral height. Chronic grade 1 anterolisthesis of L4 on L5 is stable. Normal alignment otherwise. Partially visible hyperostosis in the lower thoracic spine. Partially visible healed chronic left posterior rib fractures (left 10th rib series 4 image 11). No acute fracture or suspicious bone lesion. SOFT TISSUES: Respiratory motion artifact, atelectasis and scarring at the visible costophrenic angles. Chronic cholecystectomy. Calcified abdominal aorta and iliac artery atherosclerosis. Distended urinary bladder. Otherwise negative visible non-contrast abdominal and pelvic viscera. No acute lumbar paraspinal soft tissue abnormality. DEGENERATIVE CHANGES: Capacious CT appearance of the spinal canal from the thoracolumbar junction to L3-L4. L4-L5: Chronic grade 1 anterolisthesis with circumferential disk osteophyte complex, bulky facet and ligamentum flavum hypertrophy. Mild epidural lipomatosis. Combined, moderate multifactorial spinal stenosis is suspected (sagittal image 27), stable. L5-S1: Chronic severe facet arthropathy greater on the right. Vacuum facet on that side as progressed. Chronic epidural lipomatosis. IMPRESSION: 1. No acute traumatic injury identified in the lumbar spine. 2. Chronic lumbar spine degeneration, especially lower lumbar facet arthropathy in the setting  of grade 1 anterolisthesis L4 on L5. Electronically signed by: Helayne Hurst MD 01/23/2024 10:44 AM EST RP Workstation: [id number]   CT Head Wo Contrast Result Date: 01/23/2024 EXAM: CT HEAD WITHOUT CONTRAST 01/23/2024 10:25:00 AM TECHNIQUE: CT of the head was performed without the administration of intravenous contrast. Automated exposure control, iterative reconstruction, and/or weight based adjustment of the mA/kV was utilized to reduce the radiation dose to as low as reasonably achievable. COMPARISON: Brain MRI 11/15/YYYY. Head CT 11/14/YYYY. CLINICAL HISTORY: 62 year old female status post fall at home, found down. Pain. FINDINGS: BRAIN AND VENTRICLES: No acute hemorrhage. No evidence of acute infarct. No hydrocephalus. No extra-axial collection. No mass effect or midline shift. Brain volume is stable, within normal limits for age. Chronic asymmetry of the left lateral ventricle occipital horn appears to be a normal variant. Chronic cerebral white matter disease more pronounced in the left hemisphere appears stable. No suspicious intracranial vascular hyperdensity. Calcified atherosclerosis at the skull base. ORBITS: No acute abnormality. SINUSES: Paranasal sinuses, tympanic cavities and mastoids remain well aerated. SOFT TISSUES AND SKULL: No acute soft tissue abnormality. No skull fracture. IMPRESSION: 1. No acute traumatic injury or acute intracranial abnormality identified. 2. Stable chronic cerebral white matter disease. Electronically signed by: Helayne Hurst MD 01/23/2024 10:40 AM EST RP Workstation: [id number]     Procedures   Medications Ordered in the ED  clonazePAM  (KLONOPIN ) tablet 1 mg (has no administration in time range)  gabapentin  (NEURONTIN ) capsule 300 mg (has no administration in time range)  heparin injection 5,000 Units (5,000 Units Subcutaneous Given 01/23/24 1452)  sodium chloride  flush (NS) 0.9 % injection 3 mL (3 mLs Intravenous Given 01/23/24 1454)  lactated ringers  infusion (  Intravenous New Bag/Given 01/23/24 1452)  acetaminophen  (TYLENOL ) tablet 650 mg (has no administration in time range)    Or  acetaminophen  (TYLENOL ) suppository 650 mg (has no administration in time range)  polyethylene glycol (MIRALAX  / GLYCOLAX ) packet 17 g (has no administration in time range)  sodium chloride  0.9 % bolus 1,000 mL (0 mLs Intravenous Stopped 01/23/24 1202)  lactated ringers  bolus 1,000 mL (0 mLs Intravenous Stopped 01/23/24 1454)    Clinical Course as of 01/23/24 1455  Thu Jan 23, 2024  1138 Laboratory workup  notable for acute renal failure.  Will add on CK to evaluate for rhabdo given story of her being found next in the bed.  Transaminitis as well may be due to severe dehydration.  No traumatic findings on imaging. [MP]  1451 CK elevated at 2000.  Leukocytosis 17.2 but may be hemoconcentration.  Initial bladder scan showed around 100 cc but repeat after IV fluids showed volume of 700. In-N-Out cath completed awaiting UA. Discussed with Dr. Melvin who accepts patient for admission to hospital service [MP]    Clinical Course User Index [MP] Pamella Ozell LABOR, DO                                 Medical Decision Making 63 year old female with history as above presenting via EMS after being found next to her bed at home.  Daughter did note some slurred speech and confusion earlier.  Patient reporting headache and lower back pain.  Lower back pain is chronic.  No slurred speech or focal neurologic deficits on my exam.  She does mention that she took opioid analgesic prescribed to her at home over the last few days and has been taking this medication frequently for back pain.  Blood glucose in the 150s afebrile and normotensive.  Low suspicion for acute stroke.  Will obtain CT head C-spine to evaluate for traumatic injury.  EKG to look for evidence of dysrhythmia.  Will obtain laboratory workup to look for underlying metabolic disturbance, leukocytosis or anemia as cause of reported  confusion.  Very dry oral mucosa.  Will provide IV fluids for rehydration and continue to monitor on telemetry.  Amount and/or Complexity of Data Reviewed Labs: ordered. Radiology: ordered.  Risk Decision regarding hospitalization.        Final diagnoses:  Acute kidney injury    ED Discharge Orders     None          Pamella Ozell LABOR, DO 01/23/24 1455

## 2024-01-23 NOTE — ED Notes (Signed)
 Bladder scan 709 cc done at 1440

## 2024-01-23 NOTE — H&P (Addendum)
 History and Physical   Angela Burch FMW:[id number] DOB: Sep 02, 1960 DOA: 01/23/2024  PCP: Sherre Geni LABOR, NP   Patient coming from: Home  Chief Complaint: Found down, ?Fall  HPI: Angela Burch is a 63 y.o. female with medical history significant of hypertension, hyperlipidemia, diabetes, atrial flutter, anxiety, bronchiectasis presenting after being found down at home by daughter.  Daughter reports she found patient on the floor sitting next to her bed.  Patient could not remember how she got there.  Daughter reports the patient had some slurred speech the previous morning which had resolved.  No other deficits noted.  Patient has been taking opioids for the past few days that she has been prescribed for some pain in her back.  Patient states that she probably has not been staying as hydrated as she supposed to.  Patient denies fevers, chills, chest pain, shortness of breath, abdominal pain, constipation, diarrhea, nausea, vomiting  ED Course: Vital signs in the ED notable for blood pressure in the 110s-120s systolic.  Lab workup included CMP with sodium 133, bicarb 21, BUN 48, creatinine elevated to 5.81 from baseline of 1, glucose 128, calcium  8.0, albumin 3.3, AST 65, ALT 50, alk phos 180, T. bili 1.3.  CBC with leukocytosis to 17.2 hemoglobin stable at 11.4.  Respiratory panel for flu COVID RSV negative.  UDS and urinalysis pending.  Tylenol  level, aspirin  level, ethanol level negative.  CK mildly elevated at 2257.  Imaging included chest x-ray with minimal left basilar atelectasis for scarring.  CT head showed no acute abnormality.  CT C-spine showed no acute abnormality, degenerative disease, ankylosis.  CT L-spine showed no acute abnormality, degenerative disease.  Patient received 2 L IV fluid in the ED.  Review of Systems: As per HPI otherwise all other systems reviewed and are negative.  Past Medical History:  Diagnosis Date   Anxiety    CVA (cerebral vascular accident)  (HCC) 01/03/2023   Diabetes mellitus without complication (HCC)    DKA, type 2 (HCC) 07/09/2023   Hypertension     Past Surgical History:  Procedure Laterality Date   APPENDECTOMY     CHOLECYSTECTOMY      Social History  reports that she has been smoking cigarettes. She has never used smokeless tobacco. She reports that she does not drink alcohol and does not use drugs.  Allergies  Allergen Reactions   Sulfa Antibiotics Anaphylaxis and Hives   Metformin  And Related Diarrhea   History reviewed. No pertinent family history.   Prior to Admission medications   Medication Sig Start Date End Date Taking? Authorizing Provider  amphetamine -dextroamphetamine  (ADDERALL) 30 MG tablet Take 1 tablet by mouth daily. Patient taking differently: Take 30 mg by mouth daily as needed (Patient does not have insurance which leads her to take her meds intermittently to save money). 08/02/16   Kristy Sharolyn Lenis, PA-C  buPROPion  (WELLBUTRIN  XL) 150 MG 24 hr tablet Take 150 mg by mouth daily as needed (Patient does not have insurance which leads her to take her meds intermittently to save money). 05/13/17   [provider]  clonazePAM  (KLONOPIN ) 1 MG tablet Take 1 mg by mouth 2 (two) times daily as needed for anxiety. 03/18/17   [provider]  diclofenac  Sodium (VOLTAREN ) 1 % GEL Apply 4 g topically 4 (four) times daily. 07/12/23   Emil Share, DO  diltiazem  (CARDIZEM  LA) 180 MG 24 hr tablet Take 180 mg by mouth daily as needed (Patient does not have insurance which leads  her to take her meds intermittently to save money).    [provider]  gabapentin  (NEURONTIN ) 300 MG capsule Take 300 mg by mouth daily as needed (Patient does not have insurance which leads her to take her meds intermittently to save money.). 12/28/22   [provider]  Insulin  Glargine (BASAGLAR  KWIKPEN) 100 UNIT/ML Inject 35 Units into the skin daily. 07/10/23 08/22/23  Laurence Locus, DO  insulin  lispro  (HUMALOG ) 100 UNIT/ML KwikPen Inject 3 Units into the skin with breakfast, with lunch, and with evening meal. Plus SSI: CBG 70 - 120: 0 units CBG 121 - 150: 2 units CBG 151 - 200: 3 units CBG 201 - 250: 5 units CBG 251 - 300: 8 units CBG 301 - 350: 11 units CBG 351 - 400: 15 units 07/10/23   Laurence Locus, DO  Insulin  Pen Needle (BD PEN NEEDLE MINI ULTRAFINE) 31G X 5 MM MISC use to inject insulin  as directed 07/10/23   Laurence Locus, DO  JARDIANCE  10 MG TABS tablet Take 10 mg by mouth daily as needed (Patient does not have insurance which leads her to take her meds intermittently to save money). 11/23/22   [provider]  lisinopril  (ZESTRIL ) 10 MG tablet Take 1 tablet (10 mg total) by mouth daily. 07/10/23 10/08/23  Laurence Locus, DO  methylPREDNISolone  (MEDROL  DOSEPAK) 4 MG TBPK tablet Day 1: 8mg  before breakfast, 4 mg after lunch, 4 mg after supper, and 8 mg at bedtime Day 2: 4 mg before breakfast, 4 mg after lunch, 4 mg  after supper, and 8 mg  at bedtime Day 3:  4 mg  before breakfast, 4 mg  after lunch, 4 mg after supper, and 4 mg  at bedtime Day 4: 4 mg  before breakfast, 4 mg  after lunch, and 4 mg at bedtime Day 5: 4 mg  before breakfast and 4 mg at bedtime Day 6: 4 mg  before breakfast 07/12/23   Floyd, Dan, DO  omeprazole (PRILOSEC) 40 MG capsule Take 40 mg by mouth daily as needed (Patient does not have insurance which leads her to take her meds intermittently to save money). 12/01/22   [provider]  PARoxetine  (PAXIL ) 40 MG tablet Take 40 mg by mouth daily as needed (Patient does not have insurance which leads her to take her meds intermittently to save money). 07/13/22   [provider]  rosuvastatin  (CRESTOR ) 10 MG tablet Take 10 mg by mouth daily as needed (Patient does not have insurance which leads her to take her meds intermittently to save money). 12/02/22   [provider]    Physical Exam: Vitals:   01/23/24 0923 01/23/24 0930 01/23/24 1142 01/23/24  1402  BP: 109/75 (!) 127/58 (!) 123/111   Pulse: 91 90 93   Resp: 18 14 13    Temp: (!) 97.4 F (36.3 C)   97.9 F (36.6 C)  TempSrc:    Oral  SpO2: 96% 95% 93%     Physical Exam Constitutional:      General: She is not in acute distress.    Appearance: Normal appearance.  HENT:     Head: Normocephalic and atraumatic.     Mouth/Throat:     Mouth: Mucous membranes are dry.     Pharynx: Oropharynx is clear.  Eyes:     Extraocular Movements: Extraocular movements intact.     Pupils: Pupils are equal, round, and reactive to light.  Cardiovascular:     Rate and Rhythm: Normal rate and  regular rhythm.     Pulses: Normal pulses.     Heart sounds: Normal heart sounds.  Pulmonary:     Effort: Pulmonary effort is normal. No respiratory distress.     Breath sounds: Normal breath sounds.  Abdominal:     General: Bowel sounds are normal. There is no distension.     Palpations: Abdomen is soft.     Tenderness: There is no abdominal tenderness.  Musculoskeletal:        General: No swelling or deformity.  Skin:    General: Skin is warm and dry.  Neurological:     General: No focal deficit present.     Comments: Alert and oriented to person, place, initially disoriented to year thought it was already 2026 but then realized it was 2025.    Labs on Admission: I have personally reviewed following labs and imaging studies  CBC: Recent Labs  Lab 01/23/24 0942  WBC 17.2*  NEUTROABS 13.1*  HGB 11.4*  HCT 36.1  MCV 82.6  PLT 280    Basic Metabolic Panel: Recent Labs  Lab 01/23/24 0942  NA 133*  K 4.8  CL 98  CO2 21*  GLUCOSE 129*  BUN 48*  CREATININE 5.81*  CALCIUM  8.0*    GFR: CrCl cannot be calculated (Unknown ideal weight.).  Liver Function Tests: Recent Labs  Lab 01/23/24 0942  AST 65*  ALT 50*  ALKPHOS 180*  BILITOT 1.3*  PROT 6.5  ALBUMIN 3.3*    Urine analysis:    Component Value Date/Time   COLORURINE YELLOW 07/12/2023 2234   APPEARANCEUR CLEAR  07/12/2023 2234   LABSPEC 1.007 07/12/2023 2234   PHURINE 5.0 07/12/2023 2234   GLUCOSEU >=500 (A) 07/12/2023 2234   HGBUR NEGATIVE 07/12/2023 2234   BILIRUBINUR NEGATIVE 07/12/2023 2234   KETONESUR 20 (A) 07/12/2023 2234   PROTEINUR NEGATIVE 07/12/2023 2234   UROBILINOGEN 0.2 12/15/2011 1124   NITRITE NEGATIVE 07/12/2023 2234   LEUKOCYTESUR NEGATIVE 07/12/2023 2234    Radiological Exams on Admission: DG Chest Portable 1 View Result Date: 01/23/2024 EXAM: 1 VIEW(S) XRAY OF THE CHEST 01/23/2024 12:37:00 PM COMPARISON: 06/10/2016 CLINICAL HISTORY: ?pna pna FINDINGS: LUNGS AND PLEURA: Minimal left lateral basilar subsegmental atelectasis or scarring is noted. No pleural effusion. No pneumothorax. HEART AND MEDIASTINUM: No acute abnormality of the cardiac and mediastinal silhouettes. BONES AND SOFT TISSUES: No acute osseous abnormality. IMPRESSION: 1. Minimal left lateral basilar subsegmental atelectasis or scarring. Electronically signed by: Lynwood Seip MD 01/23/2024 01:27 PM EST RP Workstation: [id number]   CT Cervical Spine Wo Contrast Result Date: 01/23/2024 EXAM: CT CERVICAL SPINE WITHOUT CONTRAST 01/23/2024 10:25:00 AM TECHNIQUE: CT of the cervical spine was performed without the administration of intravenous contrast. Multiplanar reformatted images are provided for review. Automated exposure control, iterative reconstruction, and/or weight based adjustment of the mA/kV was utilized to reduce the radiation dose to as low as reasonably achievable. COMPARISON: CT head reported separately today. CLINICAL HISTORY: Female patient. Fall, found down. FINDINGS: CERVICAL SPINE: BONES AND ALIGNMENT: No acute traumatic injury identified in the cervical spine. No acute fracture or traumatic malalignment. Relatively preserved cervical lordosis. Mild visible upper thoracic scoliosis. Visible upper thoracic levels appear grossly intact. DEGENERATIVE CHANGES: Cervical spine degeneration including degenerative  facet ankylosis on the left at C4-C5. Bulky and partially calcified ligamentous degenerative hypertrophy about the odontoid. SOFT TISSUES: No prevertebral soft tissue swelling. Calcified cervical carotid atherosclerosis. Otherwise negative visible non-contrast neck soft tissues. Carious posterior left mandible molar. Non-contrast visible upper chest remarkable for  calcified coronary artery and aortic atherosclerosis. IMPRESSION: 1. No acute traumatic injury identified in the cervical spine. 2. 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 Electronically signed by: Helayne Hurst MD 01/23/2024 10:47 AM EST RP Workstation: [id number]   CT Lumbar Spine Wo Contrast Result Date: 01/23/2024 EXAM: CT OF THE LUMBAR SPINE WITHOUT CONTRAST 01/23/2024 10:25:00 AM TECHNIQUE: CT of the lumbar spine was performed without the administration of intravenous contrast. Multiplanar reformatted images are provided for review. Automated exposure control, iterative reconstruction, and/or weight based adjustment of the mA/kV was utilized to reduce the radiation dose to as low as reasonably achievable. COMPARISON: Lumbar spine CT 06/12/2023. CLINICAL HISTORY: 63 year old female status post fall, found down. FINDINGS: Normal lumbar segmentation. BONES AND ALIGNMENT: Chronic generalized osteopenia. Normal vertebral body heights. Stable vertebral height. Chronic grade 1 anterolisthesis of L4 on L5 is stable. Normal alignment otherwise. Partially visible hyperostosis in the lower thoracic spine. Partially visible healed chronic left posterior rib fractures (left 10th rib series 4 image 11). No acute fracture or suspicious bone lesion. SOFT TISSUES: Respiratory motion artifact, atelectasis and scarring at the visible costophrenic angles. Chronic cholecystectomy. Calcified abdominal aorta and iliac artery atherosclerosis. Distended urinary bladder. Otherwise negative visible non-contrast  abdominal and pelvic viscera. No acute lumbar paraspinal soft tissue abnormality. DEGENERATIVE CHANGES: Capacious CT appearance of the spinal canal from the thoracolumbar junction to L3-L4. L4-L5: Chronic grade 1 anterolisthesis with circumferential disk osteophyte complex, bulky facet and ligamentum flavum hypertrophy. Mild epidural lipomatosis. Combined, moderate multifactorial spinal stenosis is suspected (sagittal image 27), stable. L5-S1: Chronic severe facet arthropathy greater on the right. Vacuum facet on that side as progressed. Chronic epidural lipomatosis. IMPRESSION: 1. No acute traumatic injury identified in the lumbar spine. 2. Chronic lumbar spine degeneration, especially lower lumbar facet arthropathy in the setting of grade 1 anterolisthesis L4 on L5. Electronically signed by: Helayne Hurst MD 01/23/2024 10:44 AM EST RP Workstation: [id number]   CT Head Wo Contrast Result Date: 01/23/2024 EXAM: CT HEAD WITHOUT CONTRAST 01/23/2024 10:25:00 AM TECHNIQUE: CT of the head was performed without the administration of intravenous contrast. Automated exposure control, iterative reconstruction, and/or weight based adjustment of the mA/kV was utilized to reduce the radiation dose to as low as reasonably achievable. COMPARISON: Brain MRI 11/15/YYYY. Head CT 11/14/YYYY. CLINICAL HISTORY: 63 year old female status post fall at home, found down. Pain. FINDINGS: BRAIN AND VENTRICLES: No acute hemorrhage. No evidence of acute infarct. No hydrocephalus. No extra-axial collection. No mass effect or midline shift. Brain volume is stable, within normal limits for age. Chronic asymmetry of the left lateral ventricle occipital horn appears to be a normal variant. Chronic cerebral white matter disease more pronounced in the left hemisphere appears stable. No suspicious intracranial vascular hyperdensity. Calcified atherosclerosis at the skull base. ORBITS: No acute abnormality. SINUSES: Paranasal sinuses, tympanic  cavities and mastoids remain well aerated. SOFT TISSUES AND SKULL: No acute soft tissue abnormality. No skull fracture. IMPRESSION: 1. No acute traumatic injury or acute intracranial abnormality identified. 2. Stable chronic cerebral white matter disease. Electronically signed by: Helayne Hurst MD 01/23/2024 10:40 AM EST RP Workstation: [id number]   EKG: Independently reviewed.  Sinus rhythm at 89 bpm.  Nonspecific T wave changes.  Assessment/Plan Principal Problem:   Acute renal failure Active Problems:   GERD with esophagitis   Essential hypertension   Paroxysmal atrial flutter (HCC)   Type 2 diabetes mellitus with diabetic polyneuropathy, with long-term current use of insulin  (HCC)   Mixed diabetic  hyperlipidemia associated with type 2 diabetes mellitus (HCC)   Anxiety   Bronchiectasis (HCC)   AKI Mild rhabdomyolysis Leukocytosis ?Fall > Patient found down unsure how she got there, concern for possible fall. > On arrival to the ED, patient appeared significantly dry and has decreased p.o. intake of fluids recently. > CT of the L-spine captured almost the entirety of her kidneys and ureters and showed no hydronephrosis nor stones (did speak with radiologist who agreed).  Bladder was distended and patient is status post In-N-Out cath. > Given patient's dry oral mucosa on arrival and lack of hydronephrosis, suspect prerenal etiology.  Patient has received 2 L of IV fluids in the ED. > Suspect leukocytosis is reactive/hemoconcentration, no obvious source of infection at this time and no fevers.  Suspect her mild confusion is related to her AKI/dehydration as well.  No focal deficits and slurred speech from yesterday is resolved and was in the setting of taking pain medication for the past couple days at home.  Will monitor response to fluids. - Monitor on telemetry - Continue with IV fluids - Trend renal function and electrolytes - Trend CBC - Trend CK  Hypertension - Confirm home  regimen - Holding lisinopril  in the setting of AKI  Hyperlipidemia - Continue home rosuvastatin   Diabetes > 35 units long-acting insulin  daily and SSI at home. - 20 units long-acting insulin  daily - SSI  Paroxysmal atrial flutter - Not currently on anticoagulation - No longer taking diltiazem  per chart, will work to confirm  Anxiety - Continue as needed clonazepam  - Continue home bupropion  and Paxil  when confirmed  DVT prophylaxis: Heparin Code Status:   Full Family Communication:  None on admission Disposition Plan:   Patient is from:  Home  Anticipated DC to:  Home  Anticipated DC date:  1 to 2 days  Anticipated DC barriers: None  Consults called:  None Admission status:  Observation, telemetry  Severity of Illness: The appropriate patient status for this patient is OBSERVATION. Observation status is judged to be reasonable and necessary in order to provide the required intensity of service to ensure the patient's safety. The patient's presenting symptoms, physical exam findings, and initial radiographic and laboratory data in the context of their medical condition is felt to place them at decreased risk for further clinical deterioration. Furthermore, it is anticipated that the patient will be medically stable for discharge from the hospital within 2 midnights of admission.    Marsa KATHEE Scurry MD [facility]  How to contact the [facility] Attending or Consulting provider 7A - 7P or covering provider during after hours 7P -7A, for this patient?   Check the care team in [facility] and look for a) attending/consulting [facility] provider listed and b) the [facility] team listed Log into [email or web address] and use [facility]'s universal password to access. If you do not have the password, please contact the hospital operator. Locate the [facility] provider you are looking for under [facility] and page to a number that you can be directly reached. If you still have difficulty reaching the provider,  please page the [facility] (Director on Call) for the Hospitalists listed on amion for assistance.  01/23/2024, 2:38 PM

## 2024-01-23 NOTE — ED Notes (Addendum)
 Pt attempted to urinate on bedpan. Unable to at this time. Bladder scan shows 20ml. After bolus completion will attempt again. MD notified.

## 2024-01-23 NOTE — ED Notes (Signed)
 In and out performed per Seena MD with 600 urine out

## 2024-01-23 NOTE — ED Triage Notes (Addendum)
 Pt here from home with c/o fall unwitnessed, found sitting in the floor , no known injuries , c/o right shoulder pain , cbg  150 , unknown loc , had some slurred speech yesterday

## 2024-01-24 DIAGNOSIS — I10 Essential (primary) hypertension: Secondary | ICD-10-CM | POA: Diagnosis present

## 2024-01-24 DIAGNOSIS — W19XXXA Unspecified fall, initial encounter: Secondary | ICD-10-CM | POA: Diagnosis present

## 2024-01-24 DIAGNOSIS — J984 Other disorders of lung: Secondary | ICD-10-CM | POA: Diagnosis present

## 2024-01-24 DIAGNOSIS — F17210 Nicotine dependence, cigarettes, uncomplicated: Secondary | ICD-10-CM | POA: Diagnosis present

## 2024-01-24 DIAGNOSIS — Z604 Social exclusion and rejection: Secondary | ICD-10-CM | POA: Diagnosis present

## 2024-01-24 DIAGNOSIS — I4892 Unspecified atrial flutter: Secondary | ICD-10-CM | POA: Diagnosis present

## 2024-01-24 DIAGNOSIS — G928 Other toxic encephalopathy: Secondary | ICD-10-CM | POA: Diagnosis present

## 2024-01-24 DIAGNOSIS — M6282 Rhabdomyolysis: Secondary | ICD-10-CM | POA: Diagnosis present

## 2024-01-24 DIAGNOSIS — Y92092 Bedroom in other non-institutional residence as the place of occurrence of the external cause: Secondary | ICD-10-CM | POA: Diagnosis not present

## 2024-01-24 DIAGNOSIS — D72829 Elevated white blood cell count, unspecified: Secondary | ICD-10-CM | POA: Diagnosis present

## 2024-01-24 DIAGNOSIS — Z1152 Encounter for screening for COVID-19: Secondary | ICD-10-CM | POA: Diagnosis not present

## 2024-01-24 DIAGNOSIS — J9811 Atelectasis: Secondary | ICD-10-CM | POA: Diagnosis present

## 2024-01-24 DIAGNOSIS — E1142 Type 2 diabetes mellitus with diabetic polyneuropathy: Secondary | ICD-10-CM | POA: Diagnosis present

## 2024-01-24 DIAGNOSIS — R5381 Other malaise: Secondary | ICD-10-CM | POA: Diagnosis present

## 2024-01-24 DIAGNOSIS — G9341 Metabolic encephalopathy: Secondary | ICD-10-CM | POA: Diagnosis present

## 2024-01-24 DIAGNOSIS — E1169 Type 2 diabetes mellitus with other specified complication: Secondary | ICD-10-CM | POA: Diagnosis present

## 2024-01-24 DIAGNOSIS — N179 Acute kidney failure, unspecified: Secondary | ICD-10-CM | POA: Diagnosis present

## 2024-01-24 DIAGNOSIS — R7401 Elevation of levels of liver transaminase levels: Secondary | ICD-10-CM | POA: Diagnosis present

## 2024-01-24 DIAGNOSIS — G8929 Other chronic pain: Secondary | ICD-10-CM | POA: Diagnosis present

## 2024-01-24 DIAGNOSIS — K2100 Gastro-esophageal reflux disease with esophagitis, without bleeding: Secondary | ICD-10-CM | POA: Diagnosis present

## 2024-01-24 DIAGNOSIS — F419 Anxiety disorder, unspecified: Secondary | ICD-10-CM | POA: Diagnosis present

## 2024-01-24 DIAGNOSIS — Z794 Long term (current) use of insulin: Secondary | ICD-10-CM | POA: Diagnosis not present

## 2024-01-24 DIAGNOSIS — J479 Bronchiectasis, uncomplicated: Secondary | ICD-10-CM | POA: Diagnosis present

## 2024-01-24 DIAGNOSIS — Z8673 Personal history of transient ischemic attack (TIA), and cerebral infarction without residual deficits: Secondary | ICD-10-CM | POA: Diagnosis not present

## 2024-01-24 DIAGNOSIS — E7849 Other hyperlipidemia: Secondary | ICD-10-CM | POA: Diagnosis present

## 2024-01-24 DIAGNOSIS — E860 Dehydration: Secondary | ICD-10-CM | POA: Diagnosis present

## 2024-01-24 LAB — GLUCOSE, CAPILLARY
Glucose-Capillary: 246 mg/dL — ABNORMAL HIGH (ref 70–99)
Glucose-Capillary: 255 mg/dL — ABNORMAL HIGH (ref 70–99)
Glucose-Capillary: 302 mg/dL — ABNORMAL HIGH (ref 70–99)
Glucose-Capillary: 253 mg/dL — ABNORMAL HIGH (ref 70–99)
Glucose-Capillary: 305 mg/dL — ABNORMAL HIGH (ref 70–99)

## 2024-01-24 LAB — CBC
HCT: 32.2 % — ABNORMAL LOW (ref 36.0–46.0)
Hemoglobin: 10.4 g/dL — ABNORMAL LOW (ref 12.0–15.0)
MCH: 25.7 pg — ABNORMAL LOW (ref 26.0–34.0)
MCHC: 32.3 g/dL (ref 30.0–36.0)
MCV: 79.5 fL — ABNORMAL LOW (ref 80.0–100.0)
Platelets: 249 K/uL (ref 150–400)
RBC: 4.05 MIL/uL (ref 3.87–5.11)
RDW: 14.3 % (ref 11.5–15.5)
WBC: 9.7 K/uL (ref 4.0–10.5)
nRBC: 0 % (ref 0.0–0.2)

## 2024-01-24 LAB — HEMOGLOBIN A1C
Hgb A1c MFr Bld: 11.4 % — ABNORMAL HIGH (ref 4.8–5.6)
Mean Plasma Glucose: 280.48 mg/dL

## 2024-01-24 LAB — COMPREHENSIVE METABOLIC PANEL WITH GFR
ALT: 40 U/L (ref 0–44)
AST: 45 U/L — ABNORMAL HIGH (ref 15–41)
Albumin: 2.9 g/dL — ABNORMAL LOW (ref 3.5–5.0)
Alkaline Phosphatase: 142 U/L — ABNORMAL HIGH (ref 38–126)
Anion gap: 8 (ref 5–15)
BUN: 33 mg/dL — ABNORMAL HIGH (ref 8–23)
CO2: 25 mmol/L (ref 22–32)
Calcium: 8.6 mg/dL — ABNORMAL LOW (ref 8.9–10.3)
Chloride: 99 mmol/L (ref 98–111)
Creatinine, Ser: 1.62 mg/dL — ABNORMAL HIGH (ref 0.44–1.00)
GFR, Estimated: 35 mL/min — ABNORMAL LOW (ref >60)
Glucose, Bld: 293 mg/dL — ABNORMAL HIGH (ref 70–99)
Potassium: 4.1 mmol/L (ref 3.5–5.1)
Sodium: 132 mmol/L — ABNORMAL LOW (ref 135–145)
Total Bilirubin: 0.6 mg/dL (ref 0.0–1.2)
Total Protein: 6.2 g/dL — ABNORMAL LOW (ref 6.5–8.1)

## 2024-01-24 LAB — CK: Total CK: 1864 U/L — ABNORMAL HIGH (ref 38–234)

## 2024-01-24 LAB — HIV ANTIBODY (ROUTINE TESTING W REFLEX): HIV Screen 4th Generation wRfx: NONREACTIVE

## 2024-01-24 MED ORDER — PAROXETINE HCL 20 MG PO TABS
40.0000 mg | ORAL_TABLET | Freq: Every day | ORAL | Status: DC | PRN
  Administered 2024-01-24 – 2024-01-25 (×2): 40 mg via ORAL
  Filled 2024-01-24 (×2): qty 2

## 2024-01-24 MED ORDER — INSULIN ASPART 100 UNIT/ML IJ SOLN
6.0000 [IU] | Freq: Once | INTRAMUSCULAR | Status: AC
  Administered 2024-01-24: 6 [IU] via SUBCUTANEOUS
  Filled 2024-01-24: qty 6

## 2024-01-24 MED ORDER — CHLORHEXIDINE GLUCONATE CLOTH 2 % EX PADS
6.0000 | MEDICATED_PAD | Freq: Every day | CUTANEOUS | Status: DC
  Administered 2024-01-24 – 2024-01-26 (×3): 6 via TOPICAL

## 2024-01-24 NOTE — Care Management Obs Status (Signed)
 MEDICARE OBSERVATION STATUS NOTIFICATION   Patient Details  Name: Angela Burch MRN: [id number] Date of Birth: 1960/12/20   Medicare Observation Status Notification Given:  Yes  Obs notice signed and copy given.   Jernee Murtaugh 01/24/2024, 1:19 PM

## 2024-01-24 NOTE — Care Management Important Message (Signed)
 Important Message  Patient Details  Name: Angela Burch MRN: [id number] Date of Birth: 1960/04/10   Important Message Given:  Yes - Medicare IM     Claretta Deed 01/24/2024, 8:46 AM

## 2024-01-24 NOTE — Progress Notes (Signed)
   01/24/24 0409  Assess: MEWS Score  Temp 99.9 F (37.7 C)  BP (!) 74/66  MAP (mmHg) 70  Pulse Rate 94  SpO2 96 %  O2 Device Room Air  Assess: MEWS Score  MEWS Temp 0  MEWS Systolic 2  MEWS Pulse 0  MEWS RR 0  MEWS LOC 0  MEWS Score 2  MEWS Score Color Yellow  Assess: SIRS CRITERIA  SIRS Temperature  0  SIRS Respirations  0  SIRS Pulse 1  SIRS WBC 0  SIRS Score Sum  1

## 2024-01-24 NOTE — Progress Notes (Addendum)
 Transition of Care [facility]) - Inpatient Brief Assessment   Patient Details  Name: Angela Burch MRN: [id number] Date of Birth: May 30, 1960  Transition of [facility] CM/SW Contact:    Rosaline JONELLE Joe, RN Phone Number: 01/24/2024, 4:19 PM   Clinical Narrative: CM met with the patient at the bedside to discuss IP Care management needs.  The patient lives at home alone and plans to return home when stable.  Patient is in the process of moving and plans to move to her daughter's home in about a month.  DME at the home includes RW, crutches and glucometer.  Patient was offered Medicare choice regarding home health and patient did not have a preference.  Patient chose [employer]  Artavia, RNCM called and accepted the patient for services.  HH Pt/OT ordered to be co-signed by MD.  Patient states that she smokes cigarettes.  Resources will be provided.  Patient states that he uses gummies at home occasionally - education offered regarding cessation of no-prescribed medications.  Patient plans to have her daughter provide transportation to home by car.   Transition of Care Asessment: Insurance and Status: (P) Insurance coverage has been reviewed Patient has primary care physician: (P) Yes Home environment has been reviewed: (P) from home alone Prior level of function:: (P) self Prior/Current Home Services: (P) No current home services Social Drivers of Health Review: (P) SDOH reviewed needs interventions Readmission risk has been reviewed: (P) Yes Transition of care needs: (P) transition of care needs identified, TOC will continue to follow

## 2024-01-24 NOTE — Progress Notes (Signed)
 PROGRESS NOTE    Angela Burch  FMW:[id number] DOB: [birth year] DOA: 01/23/2024 PCP: Sherre Geni LABOR, NP   Brief Narrative:   63 y.o. female with medical history significant of hypertension, hyperlipidemia, diabetes, atrial flutter, anxiety, bronchiectasis presenting after being found down at home by daughter. Imaging included chest x-ray with minimal left basilar atelectasis for scarring.  CT head showed no acute abnormality.  CT C-spine showed no acute abnormality, degenerative disease, ankylosis.  CT L-spine showed no acute abnormality, degenerative disease.   Found to have AKI (Improving with IVF) Pending PT/OT  Lives at home by herself  Assessment & Plan:  Principal Problem:   Acute renal failure Active Problems:   GERD with esophagitis   Essential hypertension   Paroxysmal atrial flutter (HCC)   Type 2 diabetes mellitus with diabetic polyneuropathy, with long-term current use of insulin  (HCC)   Mixed diabetic hyperlipidemia associated with type 2 diabetes mellitus (HCC)   Anxiety   Bronchiectasis (HCC)   AKI, POA: Baseline Cr is within normal limits. Cr of 5.8 on admission, improved to 1.62 today. Likely pre-renal. Continue with IVF  Mild rhabdomyolysis - Monitor on telemetry - Continue with IV fluids - Trend renal function and electrolytes - Trend CK (2257 on admission, improved to 1864 today)   Hypertension - Confirm home regimen - Holding lisinopril  in the setting of AKI   Hyperlipidemia - Continue home rosuvastatin   Fall/Physical deconditioning: Pending PT/OT eval   Diabetes mellitus type 2,POA: -35 units long-acting insulin  daily and SSI at home. - Continue with 20 units long-acting insulin  daily - SSI  Leukocytosis, resolved now   Paroxysmal atrial flutter - Not currently on anticoagulation - No longer taking diltiazem  per chart   Anxiety - Continue as needed clonazepam  - Continue Paxil    Disposition: Lives at home by herself. Pending PT/OT  evaluations.   DVT prophylaxis: heparin  injection 5,000 Units Start: 01/23/24 1445     Code Status: Full Code Family Communication:  None at the bedside Status is: Observation The patient remains OBS appropriate and will d/c before 2 midnights.    Subjective:  No acute events overnight. She had trouble remembering the events that led to her hospital admission. She does live at home by herself. We spoke about her improving kidney function.  Examination:  General exam: Appears calm and comfortable Respiratory system: Clear to auscultation. Respiratory effort normal. Cardiovascular system: S1 & S2 heard, RRR. No JVD, murmurs, rubs, gallops or clicks. No pedal edema. Gastrointestinal system: Abdomen is nondistended, soft and nontender. No organomegaly or masses felt. Normal bowel sounds heard. Central nervous system: Alert and oriented. No focal neurological deficits. Extremities: Symmetric 5 x 5 power. Skin: No rashes, lesions or ulcers      Diet Orders (From admission, onward)     Start     Ordered   01/23/24 1423  Diet regular Room service appropriate? Yes; Fluid consistency: Thin  Diet effective now       Question Answer Comment  Room service appropriate? Yes   Fluid consistency: Thin      01/23/24 1436            Objective: Vitals:   01/24/24 0409 01/24/24 0416 01/24/24 0627 01/24/24 0824  BP: (!) 74/66 132/65 112/60 (!) 126/59  Pulse: 94 96 90 93  Resp:   18 20  Temp: 99.9 F (37.7 C)  98.7 F (37.1 C) 98.6 F (37 C)  TempSrc:   Oral   SpO2: 96%  99% 100%  Weight:  Height:        Intake/Output Summary (Last 24 hours) at 01/24/2024 1047 Last data filed at 01/24/2024 0900 Gross per 24 hour  Intake 3418.14 ml  Output 1000 ml  Net 2418.14 ml   Filed Weights   01/23/24 1833  Weight: 79.8 kg    Scheduled Meds:  Chlorhexidine  Gluconate Cloth  6 each Topical Daily   heparin   5,000 Units Subcutaneous Q8H   insulin  aspart  0-9 Units Subcutaneous  TID WC   insulin  glargine-yfgn  20 Units Subcutaneous Daily   sodium chloride  flush  3 mL Intravenous Q12H   Continuous Infusions:  lactated ringers  125 mL/hr at 01/24/24 0544    Nutritional status     Body mass index is 34.36 kg/m.  Data Reviewed:   CBC: Recent Labs  Lab 01/23/24 0942 01/24/24 0458  WBC 17.2* 9.7  NEUTROABS 13.1*  --   HGB 11.4* 10.4*  HCT 36.1 32.2*  MCV 82.6 79.5*  PLT 280 249   Basic Metabolic Panel: Recent Labs  Lab 01/23/24 0942 01/23/24 1829 01/24/24 0458  NA 133* 132* 132*  K 4.8 4.3 4.1  CL 98 98 99  CO2 21* 24 25  GLUCOSE 129* 389* 293*  BUN 48* 43* 33*  CREATININE 5.81* 3.41* 1.62*  CALCIUM  8.0* 8.1* 8.6*  PHOS  --  3.7  --    GFR: Estimated Creatinine Clearance: 32.8 mL/min (A) (by C-G formula based on SCr of 1.62 mg/dL (H)). Liver Function Tests: Recent Labs  Lab 01/23/24 0942 01/23/24 1829 01/24/24 0458  AST 65*  --  45*  ALT 50*  --  40  ALKPHOS 180*  --  142*  BILITOT 1.3*  --  0.6  PROT 6.5  --  6.2*  ALBUMIN 3.3* 2.8* 2.9*   No results for input(s): LIPASE, AMYLASE in the last 168 hours. No results for input(s): AMMONIA in the last 168 hours. Coagulation Profile: No results for input(s): INR, PROTIME in the last 168 hours. Cardiac Enzymes: Recent Labs  Lab 01/23/24 1228 01/24/24 0458  CKTOTAL 2,257* 1,864*   BNP (last 3 results) No results for input(s): PROBNP in the last 8760 hours. HbA1C: Recent Labs    01/24/24 0458  HGBA1C 11.4*   CBG: Recent Labs  Lab 01/23/24 0928 01/23/24 1735 01/23/24 2225 01/24/24 0613 01/24/24 0824  GLUCAP 122* 341* 329* 246* 255*   Lipid Profile: No results for input(s): CHOL, HDL, LDLCALC, TRIG, CHOLHDL, LDLDIRECT in the last 72 hours. Thyroid Function Tests: No results for input(s): TSH, T4TOTAL, FREET4, T3FREE, THYROIDAB in the last 72 hours. Anemia Panel: No results for input(s): VITAMINB12, FOLATE, FERRITIN, TIBC,  IRON, RETICCTPCT in the last 72 hours. Sepsis Labs: No results for input(s): PROCALCITON, LATICACIDVEN in the last 168 hours.  Recent Results (from the past 240 hours)  Resp panel by RT-PCR (RSV, Flu A&B, Covid) Anterior Nasal Swab     Status: None   Collection Time: 01/23/24  9:30 AM   Specimen: Anterior Nasal Swab  Result Value Ref Range Status   SARS Coronavirus 2 by RT PCR NEGATIVE NEGATIVE Final   Influenza A by PCR NEGATIVE NEGATIVE Final   Influenza B by PCR NEGATIVE NEGATIVE Final    Comment: (NOTE) The Xpert Xpress SARS-CoV-2/FLU/RSV plus assay is intended as an aid in the diagnosis of influenza from Nasopharyngeal swab specimens and should not be used as a sole basis for treatment. Nasal washings and aspirates are unacceptable for Xpert Xpress SARS-CoV-2/FLU/RSV testing.  Fact Sheet for Patients:  bloggercourse.com  Fact Sheet for Healthcare Providers: seriousbroker.it  This test is not yet approved or cleared by the [place]  FDA and has been authorized for detection and/or diagnosis of SARS-CoV-2 by FDA under an Emergency Use Authorization (EUA). This EUA will remain in effect (meaning this test can be used) for the duration of the COVID-19 declaration under Section 564(b)(1) of the Act, 21 U.S.C. section 360bbb-3(b)(1), unless the authorization is terminated or revoked.     Resp Syncytial Virus by PCR NEGATIVE NEGATIVE Final    Comment: (NOTE) Fact Sheet for Patients: bloggercourse.com  Fact Sheet for Healthcare Providers: seriousbroker.it  This test is not yet approved or cleared by the [place]  FDA and has been authorized for detection and/or diagnosis of SARS-CoV-2 by FDA under an Emergency Use Authorization (EUA). This EUA will remain in effect (meaning this test can be used) for the duration of the COVID-19 declaration under Section  564(b)(1) of the Act, 21 U.S.C. section 360bbb-3(b)(1), unless the authorization is terminated or revoked.  Performed at [facility], [address], [place], [place] [address]          Radiology Studies: DG Chest Portable 1 View Result Date: 01/23/2024 EXAM: 1 VIEW(S) XRAY OF THE CHEST 01/23/2024 12:37:00 PM COMPARISON: 06/10/2016 CLINICAL HISTORY: ?pna pna FINDINGS: LUNGS AND PLEURA: Minimal left lateral basilar subsegmental atelectasis or scarring is noted. No pleural effusion. No pneumothorax. HEART AND MEDIASTINUM: No acute abnormality of the cardiac and mediastinal silhouettes. BONES AND SOFT TISSUES: No acute osseous abnormality. IMPRESSION: 1. Minimal left lateral basilar subsegmental atelectasis or scarring. Electronically signed by: Lynwood Seip MD 01/23/2024 01:27 PM EST RP Workstation: [id number]   CT Cervical Spine Wo Contrast Result Date: 01/23/2024 EXAM: CT CERVICAL SPINE WITHOUT CONTRAST 01/23/2024 10:25:00 AM TECHNIQUE: CT of the cervical spine was performed without the administration of intravenous contrast. Multiplanar reformatted images are provided for review. Automated exposure control, iterative reconstruction, and/or weight based adjustment of the mA/kV was utilized to reduce the radiation dose to as low as reasonably achievable. COMPARISON: CT head reported separately today. CLINICAL HISTORY: Female patient. Fall, found down. FINDINGS: CERVICAL SPINE: BONES AND ALIGNMENT: No acute traumatic injury identified in the cervical spine. No acute fracture or traumatic malalignment. Relatively preserved cervical lordosis. Mild visible upper thoracic scoliosis. Visible upper thoracic levels appear grossly intact. DEGENERATIVE CHANGES: Cervical spine degeneration including degenerative facet ankylosis on the left at C4-C5. Bulky and partially calcified ligamentous degenerative hypertrophy about the odontoid. SOFT TISSUES: No prevertebral soft tissue swelling. Calcified cervical  carotid atherosclerosis. Otherwise negative visible non-contrast neck soft tissues. Carious posterior left mandible molar. Non-contrast visible upper chest remarkable for calcified coronary artery and aortic atherosclerosis. IMPRESSION: 1. No acute traumatic injury identified in the cervical spine. 2. 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 Electronically signed by: Helayne Hurst MD 01/23/2024 10:47 AM EST RP Workstation: [id number]   CT Lumbar Spine Wo Contrast Result Date: 01/23/2024 EXAM: CT OF THE LUMBAR SPINE WITHOUT CONTRAST 01/23/2024 10:25:00 AM TECHNIQUE: CT of the lumbar spine was performed without the administration of intravenous contrast. Multiplanar reformatted images are provided for review. Automated exposure control, iterative reconstruction, and/or weight based adjustment of the mA/kV was utilized to reduce the radiation dose to as low as reasonably achievable. COMPARISON: Lumbar spine CT 06/12/2023. CLINICAL HISTORY: 63 year old female status post fall, found down. FINDINGS: Normal lumbar segmentation. BONES AND ALIGNMENT: Chronic generalized osteopenia. Normal vertebral body heights. Stable vertebral height. Chronic grade 1  anterolisthesis of L4 on L5 is stable. Normal alignment otherwise. Partially visible hyperostosis in the lower thoracic spine. Partially visible healed chronic left posterior rib fractures (left 10th rib series 4 image 11). No acute fracture or suspicious bone lesion. SOFT TISSUES: Respiratory motion artifact, atelectasis and scarring at the visible costophrenic angles. Chronic cholecystectomy. Calcified abdominal aorta and iliac artery atherosclerosis. Distended urinary bladder. Otherwise negative visible non-contrast abdominal and pelvic viscera. No acute lumbar paraspinal soft tissue abnormality. DEGENERATIVE CHANGES: Capacious CT appearance of the spinal canal from the thoracolumbar junction to L3-L4. L4-L5:  Chronic grade 1 anterolisthesis with circumferential disk osteophyte complex, bulky facet and ligamentum flavum hypertrophy. Mild epidural lipomatosis. Combined, moderate multifactorial spinal stenosis is suspected (sagittal image 27), stable. L5-S1: Chronic severe facet arthropathy greater on the right. Vacuum facet on that side as progressed. Chronic epidural lipomatosis. IMPRESSION: 1. No acute traumatic injury identified in the lumbar spine. 2. Chronic lumbar spine degeneration, especially lower lumbar facet arthropathy in the setting of grade 1 anterolisthesis L4 on L5. Electronically signed by: Helayne Hurst MD 01/23/2024 10:44 AM EST RP Workstation: [id number]   CT Head Wo Contrast Result Date: 01/23/2024 EXAM: CT HEAD WITHOUT CONTRAST 01/23/2024 10:25:00 AM TECHNIQUE: CT of the head was performed without the administration of intravenous contrast. Automated exposure control, iterative reconstruction, and/or weight based adjustment of the mA/kV was utilized to reduce the radiation dose to as low as reasonably achievable. COMPARISON: Brain MRI 11/15/YYYY. Head CT 11/14/YYYY. CLINICAL HISTORY: 63 year old female status post fall at home, found down. Pain. FINDINGS: BRAIN AND VENTRICLES: No acute hemorrhage. No evidence of acute infarct. No hydrocephalus. No extra-axial collection. No mass effect or midline shift. Brain volume is stable, within normal limits for age. Chronic asymmetry of the left lateral ventricle occipital horn appears to be a normal variant. Chronic cerebral white matter disease more pronounced in the left hemisphere appears stable. No suspicious intracranial vascular hyperdensity. Calcified atherosclerosis at the skull base. ORBITS: No acute abnormality. SINUSES: Paranasal sinuses, tympanic cavities and mastoids remain well aerated. SOFT TISSUES AND SKULL: No acute soft tissue abnormality. No skull fracture. IMPRESSION: 1. No acute traumatic injury or acute intracranial abnormality  identified. 2. Stable chronic cerebral white matter disease. Electronically signed by: Helayne Hurst MD 01/23/2024 10:40 AM EST RP Workstation: [id number]           LOS: 0 days   Time spent= 35 mins    Deliliah Room, MD [facility]  If 7PM-7AM, please contact night-coverage  01/24/2024, 10:47 AM

## 2024-01-24 NOTE — Plan of Care (Signed)
  Problem: Fluid Volume: Goal: Ability to maintain a balanced intake and output will improve Outcome: Progressing   Problem: Metabolic: Goal: Ability to maintain appropriate glucose levels will improve Outcome: Progressing   Problem: Tissue Perfusion: Goal: Adequacy of tissue perfusion will improve Outcome: Progressing   Problem: Clinical Measurements: Goal: Diagnostic test results will improve Outcome: Progressing   Problem: Elimination: Goal: Will not experience complications related to urinary retention Outcome: Progressing

## 2024-01-24 NOTE — Inpatient Diabetes Management (Addendum)
 Inpatient Diabetes Program Recommendations  AACE/ADA: New Consensus Statement on Inpatient Glycemic Control (2015)  Target Ranges:  Prepandial:   less than 140 mg/dL      Peak postprandial:   less than 180 mg/dL (1-2 hours)      Critically ill patients:  140 - 180 mg/dL   Lab Results  Component Value Date   GLUCAP 246 (H) 01/24/2024   HGBA1C 11.4 (H) 01/24/2024    Review of Glycemic Control  Latest Reference Range & Units 01/23/24 18:29 01/24/24 04:58  Glucose 70 - 99 mg/dL 610 (H) 706 (H)   Diabetes history: DM 2 Outpatient Diabetes medications:  Basaglar  35 units daily Humalog  3 units tid with meals plus correction Jardiance  10 mg daily Current orders for Inpatient glycemic control:  Semglee  20 units daily, Novolog  0-9 units tid with meals  Inpatient Diabetes Program Recommendations:    Agree with current orders.  A1C signifies uncontrolled DM.  Will talk to patient today.    If CBG's continue to be > hospital goal, consider adding Novolog  meal coverage 4 units tid with meals and continue to titrate basal insulin  based on fasting CBG's.   Addendum 1400- Spoke with patient at bedside.  When I went in her room, she was sitting on side of bed and had pulled out her IV (accidentally).  States that she is fidgety and nervous.  She thinks she needs to go to the bathroom.  Called RN to assist with patient.  I did ask her if she was taking her DM medications at home, and she states yes, however A1C is continuing to increase?? Patient seems slightly confused. RN was at bedside when I left patient's room. Attempted to call patient's daughter, however it appears that number listed is wrong.      Thanks,  Randall Bullocks, RN, BC-ADM Inpatient Diabetes Coordinator Pager [phone]  (8a-5p)

## 2024-01-24 NOTE — Progress Notes (Signed)
   01/24/24 0240  Urine Measurement/Characteristics  Urinary Interventions Bladder scan  Bladder Scan Volume (mL) 970 mL

## 2024-01-24 NOTE — Progress Notes (Signed)
   01/24/24 0416  Assess: MEWS Score  BP 132/65  MAP (mmHg) 80  Pulse Rate 96  Assess: MEWS Score  MEWS Temp 0  MEWS Systolic 0  MEWS Pulse 0  MEWS RR 0  MEWS LOC 0  MEWS Score 0  MEWS Score Color Green  Assess: if the MEWS score is Yellow or Red  Were vital signs accurate and taken at a resting state? Yes  Does the patient meet 2 or more of the SIRS criteria? No  MEWS guidelines implemented  No, previously yellow, continue vital signs every 4 hours  Assess: SIRS CRITERIA  SIRS Temperature  0  SIRS Respirations  0  SIRS Pulse 1  SIRS WBC 0  SIRS Score Sum  1

## 2024-01-24 NOTE — Evaluation (Signed)
 Physical Therapy Evaluation Patient Details Name: PIERRE CUMPTON MRN: [id number] DOB: 1960-06-27 Today's Date: 01/24/2024  History of Present Illness  63 yo female admitted on 01/23/24 after being found down at home by her daughter with c/o R shoulder pain. CT head, cervical, thoracic and lumbar spine negative for acute changes. Her  PMH includes: HTN, HLD, DM2, atrial flutter, anxiety, bronchiectasis, and smoker  Clinical Impression  Pt seated on toilet upon PT arrival, CGA assistance with grab bar and RW to stand from toilet, SPV for pericare. She requires CGA for transfers and short distance ambulation, and limited by decreased functional strength and endurance below her baseline. See below for additional impairments. Prior to admission, she was living alone and IND with all ADLs and IADLs. She will benefit from continued therapy while admitted to acute, and additional follow up therapy with HHPT at discharge.         If plan is discharge home, recommend the following: Other (comment) (lives alone, limited assistance available from daughter; pt reports plans to move in with daughter to new house, but unsure when this move will occur)   Can travel by private vehicle        Equipment Recommendations None recommended by PT  Recommendations for Other Services       Functional Status Assessment Patient has had a recent decline in their functional status and demonstrates the ability to make significant improvements in function in a reasonable and predictable amount of time.     Precautions / Restrictions Precautions Precautions: Fall Recall of Precautions/Restrictions: Intact Restrictions Weight Bearing Restrictions Per Provider Order: No      Mobility  Bed Mobility Overal bed mobility: Needs Assistance Bed Mobility: Sit to Supine       Sit to supine: Used rails, HOB elevated, Supervision        Transfers Overall transfer level: Needs assistance Equipment used: Rolling  walker (2 wheels) Transfers: Sit to/from Stand Sit to Stand: Contact guard assist           General transfer comment: VC for proper hand placement, increased time to power to stand from low toilet    Ambulation/Gait Ambulation/Gait assistance: Contact guard assist Gait Distance (Feet): 30 Feet Assistive device: Rolling walker (2 wheels)   Gait velocity: decreased     General Gait Details: limited distance secondary to fatigue and patient feeling off and antsy  Stairs            Wheelchair Mobility     Tilt Bed    Modified Rankin (Stroke Patients Only)       Balance Overall balance assessment: Needs assistance Sitting-balance support: Bilateral upper extremity supported Sitting balance-Leahy Scale: Good     Standing balance support: During functional activity, Reliant on assistive device for balance Standing balance-Leahy Scale: Fair                               Pertinent Vitals/Pain Pain Assessment Pain Assessment: Faces Faces Pain Scale: Hurts little more Pain Location: abdomen, Pain Descriptors / Indicators: Other (Comment), Grimacing, Restless (reports feeling antsy and uncomfortable in her own skin) Pain Intervention(s): Limited activity within patient's tolerance, Monitored during session, Utilized relaxation techniques    Home Living Family/patient expects to be discharged to:: Private residence Living Arrangements: Alone Available Help at Discharge: Family;Available PRN/intermittently (daughter lives close, can check on her intermittently) Type of Home: House Home Access: Stairs to enter   [employer] of  Steps: 1   Home Layout: One level Home Equipment: [employer] (2 wheels);Crutches;Shower seat Additional Comments: pt reports she will be moving in with her daughter in a new house soon where all needs will be met on the first floor, unable to tell PT when this move will occur    Prior Function Prior Level of  Function : Independent/Modified Independent;History of Falls (last six months)             Mobility Comments: IND with mobility without a device, unsure what caused her to fall regarding to reason for admission ADLs Comments: IND with ADLs and IADLs     Extremity/Trunk Assessment   Upper Extremity Assessment Upper Extremity Assessment: Defer to OT evaluation    Lower Extremity Assessment Lower Extremity Assessment: Generalized weakness (pt reports legs have been feeling heavy when standing)       Communication   Communication Communication: No apparent difficulties    Cognition Arousal: Alert Behavior During Therapy: Restless   PT - Cognitive impairments: No apparent impairments                         Following commands: Intact       Cueing Cueing Techniques: Verbal cues     General Comments      Exercises     Assessment/Plan    PT Assessment Patient needs continued PT services  PT Problem List Decreased activity tolerance;Decreased strength;Decreased balance       PT Treatment Interventions Gait training;Functional mobility training;Therapeutic exercise;Balance training    PT Goals (Current goals can be found in the Care Plan section)  Acute Rehab PT Goals Patient Stated Goal: feel like myself and stronger PT Goal Formulation: With patient Time For Goal Achievement: 02/07/24 Potential to Achieve Goals: Good    Frequency Min 2X/week     Co-evaluation               AM-PAC PT 6 Clicks Mobility  Outcome Measure Help needed turning from your back to your side while in a flat bed without using bedrails?: A Little Help needed moving from lying on your back to sitting on the side of a flat bed without using bedrails?: A Little Help needed moving to and from a bed to a chair (including a wheelchair)?: A Little Help needed standing up from a chair using your arms (e.g., wheelchair or bedside chair)?: A Little Help needed to walk in  hospital room?: A Little Help needed climbing 3-5 steps with a railing? : A Little 6 Click Score: 18    End of Session Equipment Utilized During Treatment: Gait belt Activity Tolerance: Patient tolerated treatment well;Patient limited by fatigue Patient left: in bed;with call bell/phone within reach;with bed alarm set   PT Visit Diagnosis: Unsteadiness on feet (R26.81);History of falling (Z91.81)    Time: 8587-8564 PT Time Calculation (min) (ACUTE ONLY): 23 min   Charges:   PT Evaluation $PT Eval Low Complexity: 1 Low PT Treatments $Therapeutic Activity: 8-22 mins           Isaiah DEL. Keny Donald, PT, DPT   [employer] 01/24/2024, 3:34 PM

## 2024-01-24 NOTE — Progress Notes (Signed)
   01/24/24 0300  Urine Measurement/Characteristics  Urinary Interventions Intermittent/Straight cath  Bladder Scan Volume (mL) 900 mL  Intermittent/Straight Cath (mL) 1000 mL

## 2024-01-25 LAB — BASIC METABOLIC PANEL WITH GFR
Anion gap: 11 (ref 5–15)
BUN: 14 mg/dL (ref 8–23)
CO2: 25 mmol/L (ref 22–32)
Calcium: 9 mg/dL (ref 8.9–10.3)
Chloride: 99 mmol/L (ref 98–111)
Creatinine, Ser: 0.77 mg/dL (ref 0.44–1.00)
GFR, Estimated: >60 mL/min (ref >60)
Glucose, Bld: 164 mg/dL — ABNORMAL HIGH (ref 70–99)
Potassium: 3.9 mmol/L (ref 3.5–5.1)
Sodium: 135 mmol/L (ref 135–145)

## 2024-01-25 LAB — GLUCOSE, CAPILLARY
Glucose-Capillary: 177 mg/dL — ABNORMAL HIGH (ref 70–99)
Glucose-Capillary: 305 mg/dL — ABNORMAL HIGH (ref 70–99)
Glucose-Capillary: 173 mg/dL — ABNORMAL HIGH (ref 70–99)
Glucose-Capillary: 159 mg/dL — ABNORMAL HIGH (ref 70–99)

## 2024-01-25 LAB — CK: Total CK: 668 U/L — ABNORMAL HIGH (ref 38–234)

## 2024-01-25 MED ORDER — LOPERAMIDE HCL 2 MG PO CAPS
2.0000 mg | ORAL_CAPSULE | ORAL | Status: DC | PRN
  Administered 2024-01-25 – 2024-01-26 (×2): 2 mg via ORAL
  Filled 2024-01-25 (×2): qty 1

## 2024-01-25 NOTE — TOC Progression Note (Signed)
 Transition of Care [facility]) - Progression Note    Patient Details  Name: Angela Burch MRN: [id number] Date of Birth: November 05, 1960  Transition of Care [facility]) CM/SW Contact  Corean JAYSON Canary, RN Phone Number: 01/25/2024, 2:59 PM  Clinical Narrative:     Consult placed for medication assistance. The patient has insurance therefore is not a candidate for [facility] medication assistance. Recommend medications be sent to [facility] pharmacy on disharge                     Expected Discharge Plan and Services                                               Social Drivers of Health (SDOH) Interventions SDOH Screenings   Food Insecurity: Food Insecurity Present (01/23/2024)  Housing: High Risk (01/23/2024)  Transportation Needs: No Transportation Needs (01/23/2024)  Utilities: At Risk (01/23/2024)  Social Connections: Socially Isolated (07/09/2023)  Tobacco Use: High Risk (01/23/2024)    Readmission Risk Interventions    01/24/2024    4:18 PM 01/04/2023    3:00 PM  Readmission Risk Prevention Plan  Post Dischage Appt  Complete  Medication Screening  Complete  Transportation Screening Complete Complete  PCP or Specialist Appt within 5-7 Days Complete   Home Care Screening Complete   Medication Review (RN CM) Complete

## 2024-01-25 NOTE — Progress Notes (Signed)
 PROGRESS NOTE    Angela Burch  FMW:[id number] DOB: March 01, 1960 DOA: 01/23/2024 PCP: Sherre Geni LABOR, NP   Brief Narrative:   63 y.o. female with medical history significant of hypertension, hyperlipidemia, diabetes, atrial flutter, anxiety, bronchiectasis presenting after being found down at home by daughter. Imaging included chest x-ray with minimal left basilar atelectasis for scarring.  CT head showed no acute abnormality.  CT C-spine showed no acute abnormality, degenerative disease, ankylosis.  CT L-spine showed no acute abnormality, degenerative disease.   Found to have AKI (Improving with IVF) PT on board Lives at home by herself Likely dc on 12/7  Assessment & Plan:  Principal Problem:   Acute renal failure Active Problems:   GERD with esophagitis   Essential hypertension   Paroxysmal atrial flutter (HCC)   Type 2 diabetes mellitus with diabetic polyneuropathy, with long-term current use of insulin  (HCC)   Mixed diabetic hyperlipidemia associated with type 2 diabetes mellitus (HCC)   Anxiety   Bronchiectasis (HCC)   AKI, POA: Resolved.Baseline Cr is within normal limits. Cr of 5.8 on admission, improved to 0.7 today. Likely pre-renal. Dced IVF  Mild rhabdomyolysis - Monitor on telemetry - Continue with IV fluids - Trend renal function and electrolytes - Trend CK (2257 on admission, improved to 668 today)   Hypertension - Holding lisinopril  in the setting of AKI   Hyperlipidemia - Continue home rosuvastatin   Fall/Physical deconditioning:PT on board.   Diabetes mellitus type 2,POA: -35 units long-acting insulin  daily and SSI at home. - Continue with 20 units long-acting insulin  daily - SSI  Leukocytosis, resolved now   Paroxysmal atrial flutter - Not currently on anticoagulation - No longer taking diltiazem  per chart   Anxiety - Continue as needed clonazepam  - Continue Paxil    Disposition: Lives at home by herself.   DVT prophylaxis: heparin   injection 5,000 Units Start: 01/23/24 1445     Code Status: Full Code Family Communication:  None at the bedside Status is: Inpt    Subjective:  No acute events overnight. She feels much better and was having breakfast. She said that she doesn't feel ready to go home today. She lives by herself but her daughter is planning to move in with her soon. Complaining of slightly loose bowels.  Examination:  General exam: Appears calm and comfortable Respiratory system: Clear to auscultation. Respiratory effort normal. Cardiovascular system: S1 & S2 heard, RRR. No JVD, murmurs, rubs, gallops or clicks. No pedal edema. Gastrointestinal system: Abdomen is nondistended, soft and nontender. No organomegaly or masses felt. Normal bowel sounds heard. Central nervous system: Alert and oriented. No focal neurological deficits. Extremities: Symmetric 5 x 5 power. Skin: No rashes, lesions or ulcers      Diet Orders (From admission, onward)     Start     Ordered   01/23/24 1423  Diet regular Room service appropriate? Yes; Fluid consistency: Thin  Diet effective now       Question Answer Comment  Room service appropriate? Yes   Fluid consistency: Thin      01/23/24 1436            Objective: Vitals:   01/24/24 2042 01/25/24 0038 01/25/24 0516 01/25/24 0807  BP: (!) 131/51 (!) 144/57 138/63 (!) 144/76  Pulse: 89 81 82 79  Resp: 17 20  18   Temp: 98.7 F (37.1 C) 98.3 F (36.8 C) 98.3 F (36.8 C) 98.4 F (36.9 C)  TempSrc:      SpO2: 95% 100% 98% 95%  Weight:      Height:        Intake/Output Summary (Last 24 hours) at 01/25/2024 1110 Last data filed at 01/25/2024 0948 Gross per 24 hour  Intake 118 ml  Output 600 ml  Net -482 ml   Filed Weights   01/23/24 1833  Weight: 79.8 kg    Scheduled Meds:  Chlorhexidine  Gluconate Cloth  6 each Topical Daily   heparin   5,000 Units Subcutaneous Q8H   insulin  aspart  0-9 Units Subcutaneous TID WC   insulin  glargine-yfgn  20 Units  Subcutaneous Daily   sodium chloride  flush  3 mL Intravenous Q12H   Continuous Infusions:    Nutritional status     Body mass index is 34.36 kg/m.  Data Reviewed:   CBC: Recent Labs  Lab 01/23/24 0942 01/24/24 0458  WBC 17.2* 9.7  NEUTROABS 13.1*  --   HGB 11.4* 10.4*  HCT 36.1 32.2*  MCV 82.6 79.5*  PLT 280 249   Basic Metabolic Panel: Recent Labs  Lab 01/23/24 0942 01/23/24 1829 01/24/24 0458 01/25/24 0524  NA 133* 132* 132* 135  K 4.8 4.3 4.1 3.9  CL 98 98 99 99  CO2 21* 24 25 25   GLUCOSE 129* 389* 293* 164*  BUN 48* 43* 33* 14  CREATININE 5.81* 3.41* 1.62* 0.77  CALCIUM  8.0* 8.1* 8.6* 9.0  PHOS  --  3.7  --   --    GFR: Estimated Creatinine Clearance: 66.4 mL/min (by C-G formula based on SCr of 0.77 mg/dL). Liver Function Tests: Recent Labs  Lab 01/23/24 0942 01/23/24 1829 01/24/24 0458  AST 65*  --  45*  ALT 50*  --  40  ALKPHOS 180*  --  142*  BILITOT 1.3*  --  0.6  PROT 6.5  --  6.2*  ALBUMIN 3.3* 2.8* 2.9*   No results for input(s): LIPASE, AMYLASE in the last 168 hours. No results for input(s): AMMONIA in the last 168 hours. Coagulation Profile: No results for input(s): INR, PROTIME in the last 168 hours. Cardiac Enzymes: Recent Labs  Lab 01/23/24 1228 01/24/24 0458 01/25/24 0524  CKTOTAL 2,257* 1,864* 668*   BNP (last 3 results) No results for input(s): PROBNP in the last 8760 hours. HbA1C: Recent Labs    01/24/24 0458  HGBA1C 11.4*   CBG: Recent Labs  Lab 01/24/24 0824 01/24/24 1217 01/24/24 1559 01/24/24 2237 01/25/24 0820  GLUCAP 255* 302* 253* 305* 177*   Lipid Profile: No results for input(s): CHOL, HDL, LDLCALC, TRIG, CHOLHDL, LDLDIRECT in the last 72 hours. Thyroid Function Tests: No results for input(s): TSH, T4TOTAL, FREET4, T3FREE, THYROIDAB in the last 72 hours. Anemia Panel: No results for input(s): VITAMINB12, FOLATE, FERRITIN, TIBC, IRON, RETICCTPCT in  the last 72 hours. Sepsis Labs: No results for input(s): PROCALCITON, LATICACIDVEN in the last 168 hours.  Recent Results (from the past 240 hours)  Resp panel by RT-PCR (RSV, Flu A&B, Covid) Anterior Nasal Swab     Status: None   Collection Time: 01/23/24  9:30 AM   Specimen: Anterior Nasal Swab  Result Value Ref Range Status   SARS Coronavirus 2 by RT PCR NEGATIVE NEGATIVE Final   Influenza A by PCR NEGATIVE NEGATIVE Final   Influenza B by PCR NEGATIVE NEGATIVE Final    Comment: (NOTE) The Xpert Xpress SARS-CoV-2/FLU/RSV plus assay is intended as an aid in the diagnosis of influenza from Nasopharyngeal swab specimens and should not be used as a sole basis for treatment. Nasal washings and aspirates  are unacceptable for Xpert Xpress SARS-CoV-2/FLU/RSV testing.  Fact Sheet for Patients: bloggercourse.com  Fact Sheet for Healthcare Providers: seriousbroker.it  This test is not yet approved or cleared by the [place]  FDA and has been authorized for detection and/or diagnosis of SARS-CoV-2 by FDA under an Emergency Use Authorization (EUA). This EUA will remain in effect (meaning this test can be used) for the duration of the COVID-19 declaration under Section 564(b)(1) of the Act, 21 U.S.C. section 360bbb-3(b)(1), unless the authorization is terminated or revoked.     Resp Syncytial Virus by PCR NEGATIVE NEGATIVE Final    Comment: (NOTE) Fact Sheet for Patients: bloggercourse.com  Fact Sheet for Healthcare Providers: seriousbroker.it  This test is not yet approved or cleared by the [place]  FDA and has been authorized for detection and/or diagnosis of SARS-CoV-2 by FDA under an Emergency Use Authorization (EUA). This EUA will remain in effect (meaning this test can be used) for the duration of the COVID-19 declaration under Section 564(b)(1) of the Act, 21  U.S.C. section 360bbb-3(b)(1), unless the authorization is terminated or revoked.  Performed at [facility], [address], [place], [place] [address]          Radiology Studies: DG Chest Portable 1 View Result Date: 01/23/2024 EXAM: 1 VIEW(S) XRAY OF THE CHEST 01/23/2024 12:37:00 PM COMPARISON: 06/10/2016 CLINICAL HISTORY: ?pna pna FINDINGS: LUNGS AND PLEURA: Minimal left lateral basilar subsegmental atelectasis or scarring is noted. No pleural effusion. No pneumothorax. HEART AND MEDIASTINUM: No acute abnormality of the cardiac and mediastinal silhouettes. BONES AND SOFT TISSUES: No acute osseous abnormality. IMPRESSION: 1. Minimal left lateral basilar subsegmental atelectasis or scarring. Electronically signed by: Lynwood Seip MD 01/23/2024 01:27 PM EST RP Workstation: [id number]           LOS: 1 day   Time spent= 35 mins    Deliliah Room, MD [facility]  If 7PM-7AM, please contact night-coverage  01/25/2024, 11:10 AM

## 2024-01-25 NOTE — Plan of Care (Signed)
  Problem: Activity: Goal: Risk for activity intolerance will decrease Outcome: Progressing   Problem: Pain Managment: Goal: General experience of comfort will improve and/or be controlled Outcome: Progressing   Problem: Metabolic: Goal: Ability to maintain appropriate glucose levels will improve Outcome: Not Progressing   Problem: Coping: Goal: Level of anxiety will decrease Outcome: Not Progressing

## 2024-01-25 NOTE — Evaluation (Signed)
 Occupational Therapy Evaluation Patient Details Name: Angela Burch MRN: [id number] DOB: 1960-09-09 Today's Date: 01/25/2024   History of Present Illness   63 yo female admitted on 01/23/24 after being found down at home by her daughter with c/o R shoulder pain. CT head, cervical, thoracic and lumbar spine negative for acute changes. Her  PMH includes: HTN, HLD, DM2, atrial flutter, anxiety, bronchiectasis, and smoker     Clinical Impressions At baseline, pt is Independent with ADLs, IADLs, and functional mobility without an AD. Pt now presents with decreased activity tolerance, pain affecting functional level, impaired cognition (uncertain of baseline), mildly decreased balance, and decreased safety and independence with functional tasks. Pt currently demonstrating ability to complete ADLs largely Mod I to Supervision and functional mobility/transfers with close Supervision for safety. Pt also requiring cues for safety during tasks and to keep RW with her during mobility. Pt participated well in session. VSS on RA. Pt will benefit from acute OT services to address deficits and increase safety and independence with functional tasks. Post acute discharge, pt will benefit from [facility] OT, to include a home safety assessment, to maximize rehab potential, decrease risk of falls, and decrease risk of rehospitalization.      If plan is discharge home, recommend the following:   A little help with walking and/or transfers;A little help with bathing/dressing/bathroom;Assistance with cooking/housework;Direct supervision/assist for medications management;Direct supervision/assist for financial management;Assist for transportation;Help with stairs or ramp for entrance     Functional Status Assessment   Patient has had a recent decline in their functional status and demonstrates the ability to make significant improvements in function in a reasonable and predictable amount of time.     Equipment  Recommendations   None recommended by OT (Pt already has needed equipment)     Recommendations for Other Services         Precautions/Restrictions   Precautions Precautions: Fall Recall of Precautions/Restrictions: Intact Restrictions Weight Bearing Restrictions Per Provider Order: No     Mobility Bed Mobility Overal bed mobility: Needs Assistance Bed Mobility: Supine to Sit, Sit to Supine     Supine to sit: Supervision Sit to supine: Supervision   General bed mobility comments: Supervision for safety    Transfers Overall transfer level: Needs assistance Equipment used: Rolling walker (2 wheels) Transfers: Sit to/from Stand, Bed to chair/wheelchair/BSC Sit to Stand: Supervision     Step pivot transfers: Supervision     General transfer comment: Supervision for safety with cues needed for safety, hand placement, and keeping her RW with her      Balance Overall balance assessment: Needs assistance Sitting-balance support: No upper extremity supported, Feet supported Sitting balance-Leahy Scale: Good     Standing balance support: Single extremity supported, Bilateral upper extremity supported, No upper extremity supported, During functional activity Standing balance-Leahy Scale: Fair Standing balance comment: able to maintain static stand without support; requires UE support in dynamic standing/stepping                           ADL either performed or assessed with clinical judgement   ADL Overall ADL's : Modified independent;Needs assistance/impaired     Grooming: Supervision/safety;Standing   Upper Body Bathing: Set up;Supervision/ safety;Sitting   Lower Body Bathing: Set up;Supervison/ safety;Sit to/from stand   Upper Body Dressing : Modified independent;Sitting   Lower Body Dressing: Supervision/safety;Set up;Sit to/from stand   Toilet Transfer: Supervision/safety;Ambulation;Regular Toilet;Rolling walker (2 wheels);Grab bars;Cueing  for safety   Toileting-  Clothing Manipulation and Hygiene: Supervision/safety;Sit to/from stand       Functional mobility during ADLs: Supervision/safety;Rolling walker (2 wheels);Cueing for safety General ADL Comments: Pt with decreased safety awareness, requiring occasional cues for safety and to keep RW with her during transfers/mobility. Pt also with decreased activity tolerance, fatiguing quickly during tasks.     Vision Baseline Vision/History: 1 Wears glasses Ability to See in Adequate Light: 0 Adequate (with glasses) Patient Visual Report: No change from baseline Vision Assessment?: No apparent visual deficits (with glasses on) Additional Comments: Vision [facility] for tasks assessed with glasses on     Perception         Praxis         Pertinent Vitals/Pain Pain Assessment Pain Assessment: Faces Faces Pain Scale: Hurts little more Pain Location: abdomen, knees when ambulating Pain Descriptors / Indicators: Grimacing, Discomfort, Sore (reports feeling antsy and uncomfortable in her own skin) Pain Intervention(s): Limited activity within patient's tolerance, Monitored during session, Repositioned (Pt requesting anxiety medication with RN notified)     Extremity/Trunk Assessment Upper Extremity Assessment Upper Extremity Assessment: Right hand dominant;Overall [facility] for tasks assessed;RUE deficits/detail (gross B UE strength 4/5; B UE AROM, coordinaiton, and sensation WFL) RUE Deficits / Details: strength, AROM, coordinaiton, and sensation all WFL; pt with pain in shoulder with flexion and adduction following fall leading to this admission, no acute injury per imaging report   Lower Extremity Assessment Lower Extremity Assessment: Defer to PT evaluation   Cervical / Trunk Assessment Cervical / Trunk Assessment: Other exceptions Cervical / Trunk Exceptions: pain in lower back following recent fall leading to this admission   Communication Communication Communication: No  apparent difficulties   Cognition Arousal: Alert Behavior During Therapy: WFL for tasks assessed/performed Cognition: Cognition impaired, No family/caregiver present to determine baseline     Awareness: Intellectual awareness intact, Online awareness intact (intermittent online awareness) Memory impairment (select all impairments): Working civil service fast streamer, Short-term memory Attention impairment (select first level of impairment): Alternating attention Executive functioning impairment (select all impairments): Problem solving, Organization OT - Cognition Comments: Pt AAOx4 and pleasant throughout session with deficits noted above. Pt requiring occasional cues for safety and to keep RW with her during mobility.                 Following commands: Intact       Cueing  General Comments   Cueing Techniques: Verbal cues  VSS on RA. Pt reporting no dizziness or lightheadedness this day or in the day leading up to recent fall. RN present during a portion of session.   Exercises     Shoulder Instructions      Home Living Family/patient expects to be discharged to:: Private residence Living Arrangements: Alone Available Help at Discharge: Family;Available PRN/intermittently (daughter lives close, can check on her intermittently) Type of Home: House Home Access: Stairs to enter [employer] of Steps: 1   Home Layout: One level     Bathroom Shower/Tub: Chief Strategy Officer: Standard Bathroom Accessibility: Yes   Home Equipment: Agricultural Consultant (2 wheels);Crutches;Shower seat   Additional Comments: pt reports she will be moving in with her daughter in a new house soon where all needs will be met on the first floor, unable to tell PT when this move will occur      Prior Functioning/Environment Prior Level of Function : Independent/Modified Independent;History of Falls (last six months);Driving             Mobility Comments: Ind with mobility  without a  device; unsure what caused her to fall regarding to reason for admission; pt reports one other similar fall that she does not remeber about a a year ago where she was also found down by her daughter ADLs Comments: Ind with ADLs and IADLs, drives; retired occupational psychologist; 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; pt also reporting she has not been as active lately as she used to be and stating she often forgets she needs to exercise    OT Problem List: Decreased activity tolerance;Impaired balance (sitting and/or standing);Decreased cognition;Decreased safety awareness   OT Treatment/Interventions: Self-care/ADL training;Therapeutic exercise;Energy conservation;DME and/or AE instruction;Therapeutic activities;Patient/family education;Balance training      OT Goals(Current goals can be found in the care plan section)   Acute Rehab OT Goals Patient Stated Goal: to feel better and return home OT Goal Formulation: With patient Time For Goal Achievement: 01/25/24 Potential to Achieve Goals: Good ADL Goals Pt Will Perform Grooming: with modified independence;standing Pt Will Perform Lower Body Bathing: with modified independence;sit to/from stand Pt Will Perform Lower Body Dressing: with modified independence;sit to/from stand Pt Will Transfer to Toilet: with modified independence;ambulating;regular height toilet (with least restricitve AD) Additional ADL Goal #1: Patient will demonstrate ability to accurately set up a weekly pill planner with Mod I to increase safety and improve overall management of chronic health conditions.   OT Frequency:  Min 1X/week    Co-evaluation              AM-PAC OT 6 Clicks Daily Activity     Outcome Measure Help from another person eating meals?: None Help from another person taking care of personal grooming?: A Little Help from another person toileting, which includes using toliet, bedpan, or urinal?: A Little Help from another person bathing (including washing,  rinsing, drying)?: A Little Help from another person to put on and taking off regular upper body clothing?: None Help from another person to put on and taking off regular lower body clothing?: A Little 6 Click Score: 20   End of Session Equipment Utilized During Treatment: Rolling walker (2 wheels);Gait belt Nurse Communication: Mobility status;Other (comment) (Pt requesting anxiety medication with RN administering medication toward end of session.)  Activity Tolerance: Patient tolerated treatment well Patient left: in bed;with call bell/phone within reach;with nursing/sitter in room  OT Visit Diagnosis: Unsteadiness on feet (R26.81);Other (comment);Other symptoms and signs involving cognitive function (decreased activity tolerance)                Time: 8752-8673 OT Time Calculation (min): 39 min Charges:  OT General Charges $OT Visit: 1 Visit OT Evaluation $OT Eval Low Complexity: 1 Low OT Treatments $Self Care/Home Management : 23-37 mins  Margarie Rockey HERO., OTR/L, MA Acute Rehab [phone]   Margarie FORBES Horns 01/25/2024, 2:27 PM

## 2024-01-26 LAB — CK: Total CK: 266 U/L — ABNORMAL HIGH (ref 38–234)

## 2024-01-26 LAB — GLUCOSE, CAPILLARY: Glucose-Capillary: 145 mg/dL — ABNORMAL HIGH (ref 70–99)

## 2024-01-26 NOTE — Plan of Care (Signed)

## 2024-01-26 NOTE — Progress Notes (Signed)
 Occupational Therapy Treatment Patient Details Name: Angela Burch MRN: [id number] DOB: 1960/05/05 Today's Date: 01/26/2024   History of present illness 63 yo female admitted on 01/23/24 after being found down at home by her daughter with c/o R shoulder pain. CT head, cervical, thoracic and lumbar spine negative for acute changes. Her  PMH includes: HTN, HLD, DM2, atrial flutter, anxiety, bronchiectasis, and smoker   OT comments  Pt. Seen for skilled OT tx session with focus on cognitive follow up with administering of pill box test.  Pt. Was able to complete with zero errors in the indicated time frame of 5 min. And had 40 seconds remaining.  Pt. Able to verbalize how she manages her medications at home and spoke openly about assistance available with this task, and driving if needed from her dtr. Who she reports lives close by.  Reviewed fall prevention strategies with pt. Also in relation to positional changes and waiting between each one to allow herself time to see if she is ready to begin standing or ambulation.  Pt. Was receptive and agreeable to recommendations.  Ambulation to/from b.room and standing grooming task with no DME or lob noted.  Cont. With acute OT POC.        If plan is discharge home, recommend the following:  A little help with walking and/or transfers;A little help with bathing/dressing/bathroom;Assistance with cooking/housework;Direct supervision/assist for medications management;Direct supervision/assist for financial management;Assist for transportation;Help with stairs or ramp for entrance   Equipment Recommendations  None recommended by OT    Recommendations for Other Services      Precautions / Restrictions Precautions Precautions: Fall Recall of Precautions/Restrictions: Intact       Mobility Bed Mobility Overal bed mobility: Needs Assistance       Supine to sit: Supervision          Transfers Overall transfer level: Needs assistance Equipment  used: None Transfers: Sit to/from Stand, Bed to chair/wheelchair/BSC Sit to Stand: Supervision     Step pivot transfers: Supervision           Balance                                           ADL either performed or assessed with clinical judgement   ADL Overall ADL's : Needs assistance/impaired     Grooming: Supervision/safety;Standing                   Toilet Transfer: Supervision/safety;Ambulation;Regular Toilet;Grab bars Toilet Transfer Details (indicate cue type and reason): pt. reports she has been amb. to/from b.room w/o assistance. used the b.room during tx session with no use of DME no lob noted           General ADL Comments: reviewed fall prevention in relation to positional changes. pt. stood from eob secondary BM urgency and began walking immediately.  reviewed with pt. not to wait until that urgent to allow time to get her bearings from sit/stand before beg. ambulation    Extremity/Trunk Assessment              Vision       Perception     Praxis     Communication Communication Communication: No apparent difficulties   Cognition Arousal: Alert Behavior During Therapy: WFL for tasks assessed/performed       Awareness: Intellectual awareness intact, Online awareness intact  OT - Cognition Comments: administered pill box test.  pt. able to complete in indicated 5 min. time with 40 seconds remaining on timer.  0 errors with a perfect passing score.  pt. able to verbalize that she sets up her weekly pill box and that her pill box is a little more simpler than ours.  pt. able to verbalize that she has come to terms with changes that occur as she is getting older and her and her dtr have had conversations.  she reports that dtr lives close by and if she was feeling confused or struggling with weekly pill organization that she would request assistance from her dtr. completing this task. she also provided example that  is she does not feel comfortable driving she does not and would ask for assistance from dtr.  she states i would never risk anyone elses health ( in reference to driving and risk for accident)                 Following commands: Intact        Cueing   Cueing Techniques: Verbal cues  Exercises      Shoulder Instructions       General Comments      Pertinent Vitals/ Pain       Pain Assessment Pain Assessment: Faces Pain Location: all over Pain Descriptors / Indicators: Discomfort, Sore  Home Living                                          Prior Functioning/Environment              Frequency           Progress Toward Goals  OT Goals(current goals can now be found in the care plan section)  Progress towards OT goals: Progressing toward goals     Plan      Co-evaluation                 AM-PAC OT 6 Clicks Daily Activity     Outcome Measure   Help from another person eating meals?: None Help from another person taking care of personal grooming?: A Little Help from another person toileting, which includes using toliet, bedpan, or urinal?: A Little Help from another person bathing (including washing, rinsing, drying)?: A Little Help from another person to put on and taking off regular upper body clothing?: None Help from another person to put on and taking off regular lower body clothing?: A Little 6 Click Score: 20    End of Session    OT Visit Diagnosis: Unsteadiness on feet (R26.81);Other (comment);Other symptoms and signs involving cognitive function   Activity Tolerance Patient tolerated treatment well   Patient Left in bed;with call bell/phone within reach;Other (comment) (seated eob)   Nurse Communication Mobility status        Time: 9093-9073 OT Time Calculation (min): 20 min  Charges: OT General Charges $OT Visit: 1 Visit OT Treatments $Self Care/Home Management : 8-22 mins  Randall, COTA/L Acute  Rehabilitation [phone]   CHRISTELLA Nest Lorraine-COTA/L  01/26/2024, 9:45 AM

## 2024-01-26 NOTE — Plan of Care (Signed)
 Problem: Education: Goal: Ability to describe self-care measures that may prevent or decrease complications (Diabetes Survival Skills Education) will improve 01/26/2024 1101 by Joshua Zorita CROME, RN Outcome: Adequate for Discharge 01/26/2024 1101 by Joshua Zorita CROME, RN Outcome: Adequate for Discharge Goal: Individualized Educational Video(s) 01/26/2024 1101 by Joshua Zorita CROME, RN Outcome: Adequate for Discharge 01/26/2024 1101 by Joshua Zorita CROME, RN Outcome: Adequate for Discharge   Problem: Coping: Goal: Ability to adjust to condition or change in health will improve 01/26/2024 1101 by Joshua Zorita CROME, RN Outcome: Adequate for Discharge 01/26/2024 1101 by Joshua Zorita CROME, RN Outcome: Adequate for Discharge   Problem: Fluid Volume: Goal: Ability to maintain a balanced intake and output will improve 01/26/2024 1101 by Joshua Zorita CROME, RN Outcome: Adequate for Discharge 01/26/2024 1101 by Joshua Zorita CROME, RN Outcome: Adequate for Discharge   Problem: Health Behavior/Discharge Planning: Goal: Ability to identify and utilize available resources and services will improve 01/26/2024 1101 by Joshua Zorita CROME, RN Outcome: Adequate for Discharge 01/26/2024 1101 by Joshua Zorita CROME, RN Outcome: Adequate for Discharge Goal: Ability to manage health-related needs will improve 01/26/2024 1101 by Joshua Zorita CROME, RN Outcome: Adequate for Discharge 01/26/2024 1101 by Joshua Zorita CROME, RN Outcome: Adequate for Discharge   Problem: Metabolic: Goal: Ability to maintain appropriate glucose levels will improve 01/26/2024 1101 by Joshua Zorita CROME, RN Outcome: Adequate for Discharge 01/26/2024 1101 by Joshua Zorita CROME, RN Outcome: Adequate for Discharge   Problem: Nutritional: Goal: Maintenance of adequate nutrition will improve 01/26/2024 1101 by Joshua Zorita CROME, RN Outcome: Adequate for Discharge 01/26/2024 1101 by Joshua Zorita CROME, RN Outcome: Adequate for Discharge Goal: Progress toward achieving an optimal weight will  improve 01/26/2024 1101 by Joshua Zorita CROME, RN Outcome: Adequate for Discharge 01/26/2024 1101 by Joshua Zorita CROME, RN Outcome: Adequate for Discharge   Problem: Skin Integrity: Goal: Risk for impaired skin integrity will decrease 01/26/2024 1101 by Joshua Zorita CROME, RN Outcome: Adequate for Discharge 01/26/2024 1101 by Joshua Zorita CROME, RN Outcome: Adequate for Discharge   Problem: Tissue Perfusion: Goal: Adequacy of tissue perfusion will improve 01/26/2024 1101 by Joshua Zorita CROME, RN Outcome: Adequate for Discharge 01/26/2024 1101 by Joshua Zorita CROME, RN Outcome: Adequate for Discharge   Problem: Education: Goal: Knowledge of General Education information will improve Description: Including pain rating scale, medication(s)/side effects and non-pharmacologic comfort measures 01/26/2024 1101 by Joshua Zorita CROME, RN Outcome: Adequate for Discharge 01/26/2024 1101 by Joshua Zorita CROME, RN Outcome: Adequate for Discharge   Problem: Health Behavior/Discharge Planning: Goal: Ability to manage health-related needs will improve 01/26/2024 1101 by Joshua Zorita CROME, RN Outcome: Adequate for Discharge 01/26/2024 1101 by Joshua Zorita CROME, RN Outcome: Adequate for Discharge   Problem: Clinical Measurements: Goal: Ability to maintain clinical measurements within normal limits will improve 01/26/2024 1101 by Joshua Zorita CROME, RN Outcome: Adequate for Discharge 01/26/2024 1101 by Joshua Zorita CROME, RN Outcome: Adequate for Discharge Goal: Will remain free from infection 01/26/2024 1101 by Joshua Zorita CROME, RN Outcome: Adequate for Discharge 01/26/2024 1101 by Joshua Zorita CROME, RN Outcome: Adequate for Discharge Goal: Diagnostic test results will improve 01/26/2024 1101 by Joshua Zorita CROME, RN Outcome: Adequate for Discharge 01/26/2024 1101 by Joshua Zorita CROME, RN Outcome: Adequate for Discharge Goal: Respiratory complications will improve 01/26/2024 1101 by Joshua Zorita CROME, RN Outcome: Adequate for Discharge 01/26/2024 1101 by  Joshua Zorita CROME, RN Outcome: Adequate for Discharge Goal: Cardiovascular complication will be avoided 01/26/2024 1101 by Joshua Zorita CROME, RN Outcome:  Adequate for Discharge 01/26/2024 1101 by Joshua Zorita CROME, RN Outcome: Adequate for Discharge   Problem: Activity: Goal: Risk for activity intolerance will decrease 01/26/2024 1101 by Joshua Zorita CROME, RN Outcome: Adequate for Discharge 01/26/2024 1101 by Joshua Zorita CROME, RN Outcome: Adequate for Discharge   Problem: Nutrition: Goal: Adequate nutrition will be maintained 01/26/2024 1101 by Joshua Zorita CROME, RN Outcome: Adequate for Discharge 01/26/2024 1101 by Joshua Zorita CROME, RN Outcome: Adequate for Discharge   Problem: Coping: Goal: Level of anxiety will decrease 01/26/2024 1101 by Joshua Zorita CROME, RN Outcome: Adequate for Discharge 01/26/2024 1101 by Joshua Zorita CROME, RN Outcome: Adequate for Discharge   Problem: Elimination: Goal: Will not experience complications related to bowel motility 01/26/2024 1101 by Joshua Zorita CROME, RN Outcome: Adequate for Discharge 01/26/2024 1101 by Joshua Zorita CROME, RN Outcome: Adequate for Discharge Goal: Will not experience complications related to urinary retention 01/26/2024 1101 by Joshua Zorita CROME, RN Outcome: Adequate for Discharge 01/26/2024 1101 by Joshua Zorita CROME, RN Outcome: Adequate for Discharge   Problem: Pain Managment: Goal: General experience of comfort will improve and/or be controlled 01/26/2024 1101 by Joshua Zorita CROME, RN Outcome: Adequate for Discharge 01/26/2024 1101 by Joshua Zorita CROME, RN Outcome: Adequate for Discharge   Problem: Safety: Goal: Ability to remain free from injury will improve 01/26/2024 1101 by Joshua Zorita CROME, RN Outcome: Adequate for Discharge 01/26/2024 1101 by Joshua Zorita CROME, RN Outcome: Adequate for Discharge   Problem: Skin Integrity: Goal: Risk for impaired skin integrity will decrease 01/26/2024 1101 by Joshua Zorita CROME, RN Outcome: Adequate for Discharge 01/26/2024 1101  by Joshua Zorita CROME, RN Outcome: Adequate for Discharge

## 2024-01-26 NOTE — Discharge Summary (Signed)
 Physician Discharge Summary   Patient: Angela Burch MRN: [id number] DOB: Mar 03, 1960  Admit date:     01/23/2024  Discharge date: 01/26/24  Discharge Physician: Angela Burch   PCP: Angela Geni LABOR, NP   Recommendations at discharge:    Pt to be discharged home with home health.   If you experience worsening fever, chills, chest pain, shortness of breath, or other concerning symptoms, please call your PCP or go to the emergency department immediately.  Discharge Diagnoses: Principal Problem:   Acute renal failure Active Problems:   GERD with esophagitis   Essential hypertension   Paroxysmal atrial flutter (HCC)   Type 2 diabetes mellitus with diabetic polyneuropathy, with long-term current use of insulin  (HCC)   Mixed diabetic hyperlipidemia associated with type 2 diabetes mellitus (HCC)   Anxiety   Bronchiectasis (HCC)  Resolved Problems:   Diabetes [facility])   Hospital Course:  63 y.o. female with medical history significant of hypertension, hyperlipidemia, diabetes, atrial flutter, anxiety, bronchiectasis presenting after being found down at home by daughter. Imaging included chest x-ray with minimal left basilar atelectasis for scarring.  CT head showed no acute abnormality.  CT C-spine showed no acute abnormality, degenerative disease, ankylosis.  CT L-spine showed no acute abnormality, degenerative disease.    Found to have AKI (Improving with IVF) PT on board Lives at home by herself Likely dc on 12/7  Assessment and Plan:  Mild rhabdomyolysis with acute kidney injury - Creatinine 5.8 on presentation with elevated CK 2257.  Aggressive IV fluid hydration throughout the course hospital stay showed remarkable improvement.  Creatinine now within normal limits.  Encourage continued p.o. oral hydration.  Etiology likely multifactorial with dehydration, polypharmacy.  Hypertension/hyperlipidemia/diabetes mellitus - Resume home medication regiment.  Anxiety - Continue home  clonazepam , Paxil .  Possible polypharmacy - Patient on multiple sedative and stimulant type medications including Adderall, clonazepam , muscle relaxers.  Would encourage patient to work with PCP to wean medication regimen if possible.  Physical debilitation muscle weakness - Evaluated by PT/OT.  Going home with home health.    Consultants: None Procedures performed: None Disposition: Home health Diet recommendation:  Discharge Diet Orders (From admission, onward)     Start     Ordered   01/26/24 0000  Diet - low sodium heart healthy        01/26/24 0955           Cardiac and Carb modified diet  DISCHARGE MEDICATION: Allergies as of 01/26/2024       Reactions   Sulfa Antibiotics Anaphylaxis, Hives   Metformin  And Related Diarrhea        Medication List     STOP taking these medications    Jardiance  10 MG Tabs tablet Generic drug: empagliflozin    methylPREDNISolone  4 MG Tbpk tablet Commonly known as: MEDROL  DOSEPAK       TAKE these medications    amphetamine -dextroamphetamine  10 MG tablet Commonly known as: ADDERALL Take 10 mg by mouth daily with breakfast. What changed: Another medication with the same name was changed. Make sure you understand how and when to take each.   amphetamine -dextroamphetamine  30 MG tablet Commonly known as: ADDERALL Take 1 tablet by mouth daily. What changed: when to take this   BD Pen Needle Mini Ultrafine 31G X 5 MM Misc Generic drug: Insulin  Pen Needle use to inject insulin  as directed   buPROPion  150 MG 24 hr tablet Commonly known as: WELLBUTRIN  XL Take 150 mg by mouth daily.   celecoxib 200  MG capsule Commonly known as: CELEBREX Take 200 mg by mouth daily.   clonazePAM  1 MG tablet Commonly known as: KLONOPIN  Take 1 mg by mouth 2 (two) times daily as needed for anxiety.   cyclobenzaprine 10 MG tablet Commonly known as: FLEXERIL Take 10 mg by mouth at bedtime.   diclofenac  Sodium 1 % Gel Commonly known as:  VOLTAREN  Apply 4 g topically 4 (four) times daily. What changed:  when to take this reasons to take this   diltiazem  180 MG 24 hr capsule Commonly known as: CARDIZEM  CD Take 180 mg by mouth daily.   gabapentin  300 MG capsule Commonly known as: NEURONTIN  Take 300 mg by mouth 3 (three) times daily.   insulin  lispro 100 UNIT/ML KwikPen Commonly known as: HUMALOG  Inject 3 Units into the skin with breakfast, with lunch, and with evening meal. Plus SSI: CBG 70 - 120: 0 units CBG 121 - 150: 2 units CBG 151 - 200: 3 units CBG 201 - 250: 5 units CBG 251 - 300: 8 units CBG 301 - 350: 11 units CBG 351 - 400: 15 units   lisinopril  10 MG tablet Commonly known as: ZESTRIL  Take 1 tablet (10 mg total) by mouth daily.   meclizine  25 MG tablet Commonly known as: ANTIVERT  Take 25 mg by mouth 2 (two) times daily as needed for dizziness.   naloxone 4 MG/0.1ML Liqd nasal spray kit Commonly known as: NARCAN Place 1 spray into the nose daily as needed (for overdose).   omeprazole 40 MG capsule Commonly known as: PRILOSEC Take 40 mg by mouth daily as needed (Patient does not have insurance which leads her to take her meds intermittently to save money).   ondansetron  8 MG tablet Commonly known as: ZOFRAN  Take 8 mg by mouth every 8 (eight) hours as needed for vomiting or nausea.   oxyCODONE  5 MG immediate release tablet Commonly known as: Oxy IR/ROXICODONE  Take 5 mg by mouth 4 (four) times daily as needed for moderate pain (pain score 4-6).   PARoxetine  40 MG tablet Commonly known as: PAXIL  Take 40 mg by mouth daily.   rosuvastatin  10 MG tablet Commonly known as: CRESTOR  Take 10 mg by mouth daily.   Tresiba FlexTouch 100 UNIT/ML FlexTouch Pen Generic drug: insulin  degludec Inject 35 Units into the skin 2 (two) times daily.        Contact information for after-discharge care     Home Medical Care     [facility]) .   Service: Home Health  Services Contact information: [address] [place] [address] [place]  [address] [phone]                     Discharge Exam: Angela Burch   01/23/24 1833  Weight: 79.8 kg    GENERAL:  Alert, pleasant, no acute distress  HEENT:  EOMI CARDIOVASCULAR:  RRR, no murmurs appreciated RESPIRATORY:  Clear to auscultation, no wheezing, rales, or rhonchi GASTROINTESTINAL:  Soft, nontender, nondistended EXTREMITIES:  No LE edema bilaterally NEURO:  No new focal deficits appreciated SKIN:  No rashes noted PSYCH:  Appropriate mood and affect     Condition at discharge: improving  The results of significant diagnostics from this hospitalization (including imaging, microbiology, ancillary and laboratory) are listed below for reference.   Imaging Studies: DG Chest Portable 1 View Result Date: 01/23/2024 EXAM: 1 VIEW(S) XRAY OF THE CHEST 01/23/2024 12:37:00 PM COMPARISON: 06/10/2016 CLINICAL HISTORY: ?pna pna FINDINGS: LUNGS  AND PLEURA: Minimal left lateral basilar subsegmental atelectasis or scarring is noted. No pleural effusion. No pneumothorax. HEART AND MEDIASTINUM: No acute abnormality of the cardiac and mediastinal silhouettes. BONES AND SOFT TISSUES: No acute osseous abnormality. IMPRESSION: 1. Minimal left lateral basilar subsegmental atelectasis or scarring. Electronically signed by: Lynwood Seip MD 01/23/2024 01:27 PM EST RP Workstation: [id number]   CT Cervical Spine Wo Contrast Result Date: 01/23/2024 EXAM: CT CERVICAL SPINE WITHOUT CONTRAST 01/23/2024 10:25:00 AM TECHNIQUE: CT of the cervical spine was performed without the administration of intravenous contrast. Multiplanar reformatted images are provided for review. Automated exposure control, iterative reconstruction, and/or weight based adjustment of the mA/kV was utilized to reduce the radiation dose to as low as reasonably achievable. COMPARISON: CT head reported separately today. CLINICAL  HISTORY: Female patient. Fall, found down. FINDINGS: CERVICAL SPINE: BONES AND ALIGNMENT: No acute traumatic injury identified in the cervical spine. No acute fracture or traumatic malalignment. Relatively preserved cervical lordosis. Mild visible upper thoracic scoliosis. Visible upper thoracic levels appear grossly intact. DEGENERATIVE CHANGES: Cervical spine degeneration including degenerative facet ankylosis on the left at C4-C5. Bulky and partially calcified ligamentous degenerative hypertrophy about the odontoid. SOFT TISSUES: No prevertebral soft tissue swelling. Calcified cervical carotid atherosclerosis. Otherwise negative visible non-contrast neck soft tissues. Carious posterior left mandible molar. Non-contrast visible upper chest remarkable for calcified coronary artery and aortic atherosclerosis. IMPRESSION: 1. No acute traumatic injury identified in the cervical spine. 2. 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 Electronically signed by: Helayne Hurst MD 01/23/2024 10:47 AM EST RP Workstation: [id number]   CT Lumbar Spine Wo Contrast Result Date: 01/23/2024 EXAM: CT OF THE LUMBAR SPINE WITHOUT CONTRAST 01/23/2024 10:25:00 AM TECHNIQUE: CT of the lumbar spine was performed without the administration of intravenous contrast. Multiplanar reformatted images are provided for review. Automated exposure control, iterative reconstruction, and/or weight based adjustment of the mA/kV was utilized to reduce the radiation dose to as low as reasonably achievable. COMPARISON: Lumbar spine CT 06/12/2023. CLINICAL HISTORY: 63 year old female status post fall, found down. FINDINGS: Normal lumbar segmentation. BONES AND ALIGNMENT: Chronic generalized osteopenia. Normal vertebral body heights. Stable vertebral height. Chronic grade 1 anterolisthesis of L4 on L5 is stable. Normal alignment otherwise. Partially visible hyperostosis in the lower thoracic spine.  Partially visible healed chronic left posterior rib fractures (left 10th rib series 4 image 11). No acute fracture or suspicious bone lesion. SOFT TISSUES: Respiratory motion artifact, atelectasis and scarring at the visible costophrenic angles. Chronic cholecystectomy. Calcified abdominal aorta and iliac artery atherosclerosis. Distended urinary bladder. Otherwise negative visible non-contrast abdominal and pelvic viscera. No acute lumbar paraspinal soft tissue abnormality. DEGENERATIVE CHANGES: Capacious CT appearance of the spinal canal from the thoracolumbar junction to L3-L4. L4-L5: Chronic grade 1 anterolisthesis with circumferential disk osteophyte complex, bulky facet and ligamentum flavum hypertrophy. Mild epidural lipomatosis. Combined, moderate multifactorial spinal stenosis is suspected (sagittal image 27), stable. L5-S1: Chronic severe facet arthropathy greater on the right. Vacuum facet on that side as progressed. Chronic epidural lipomatosis. IMPRESSION: 1. No acute traumatic injury identified in the lumbar spine. 2. Chronic lumbar spine degeneration, especially lower lumbar facet arthropathy in the setting of grade 1 anterolisthesis L4 on L5. Electronically signed by: Helayne Hurst MD 01/23/2024 10:44 AM EST RP Workstation: [id number]   CT Head Wo Contrast Result Date: 01/23/2024 EXAM: CT HEAD WITHOUT CONTRAST 01/23/2024 10:25:00 AM TECHNIQUE: CT of the head was performed without the administration of intravenous contrast. Automated exposure control, iterative reconstruction, and/or  weight based adjustment of the mA/kV was utilized to reduce the radiation dose to as low as reasonably achievable. COMPARISON: Brain MRI 11/15/YYYY. Head CT 11/14/YYYY. CLINICAL HISTORY: 63 year old female status post fall at home, found down. Pain. FINDINGS: BRAIN AND VENTRICLES: No acute hemorrhage. No evidence of acute infarct. No hydrocephalus. No extra-axial collection. No mass effect or midline shift. Brain volume  is stable, within normal limits for age. Chronic asymmetry of the left lateral ventricle occipital horn appears to be a normal variant. Chronic cerebral white matter disease more pronounced in the left hemisphere appears stable. No suspicious intracranial vascular hyperdensity. Calcified atherosclerosis at the skull base. ORBITS: No acute abnormality. SINUSES: Paranasal sinuses, tympanic cavities and mastoids remain well aerated. SOFT TISSUES AND SKULL: No acute soft tissue abnormality. No skull fracture. IMPRESSION: 1. No acute traumatic injury or acute intracranial abnormality identified. 2. Stable chronic cerebral white matter disease. Electronically signed by: Helayne Hurst MD 01/23/2024 10:40 AM EST RP Workstation: [id number]    Microbiology: Results for orders placed or performed during the hospital encounter of 01/23/24  Resp panel by RT-PCR (RSV, Flu A&B, Covid) Anterior Nasal Swab     Status: None   Collection Time: 01/23/24  9:30 AM   Specimen: Anterior Nasal Swab  Result Value Ref Range Status   SARS Coronavirus 2 by RT PCR NEGATIVE NEGATIVE Final   Influenza A by PCR NEGATIVE NEGATIVE Final   Influenza B by PCR NEGATIVE NEGATIVE Final    Comment: (NOTE) The Xpert Xpress SARS-CoV-2/FLU/RSV plus assay is intended as an aid in the diagnosis of influenza from Nasopharyngeal swab specimens and should not be used as a sole basis for treatment. Nasal washings and aspirates are unacceptable for Xpert Xpress SARS-CoV-2/FLU/RSV testing.  Fact Sheet for Patients: bloggercourse.com  Fact Sheet for Healthcare Providers: seriousbroker.it  This test is not yet approved or cleared by the [place]  FDA and has been authorized for detection and/or diagnosis of SARS-CoV-2 by FDA under an Emergency Use Authorization (EUA). This EUA will remain in effect (meaning this test can be used) for the duration of the COVID-19 declaration under Section  564(b)(1) of the Act, 21 U.S.C. section 360bbb-3(b)(1), unless the authorization is terminated or revoked.     Resp Syncytial Virus by PCR NEGATIVE NEGATIVE Final    Comment: (NOTE) Fact Sheet for Patients: bloggercourse.com  Fact Sheet for Healthcare Providers: seriousbroker.it  This test is not yet approved or cleared by the [place]  FDA and has been authorized for detection and/or diagnosis of SARS-CoV-2 by FDA under an Emergency Use Authorization (EUA). This EUA will remain in effect (meaning this test can be used) for the duration of the COVID-19 declaration under Section 564(b)(1) of the Act, 21 U.S.C. section 360bbb-3(b)(1), unless the authorization is terminated or revoked.  Performed at [facility], [address], [place], [place] [address]     Labs: CBC: Recent Labs  Lab 01/23/24 [phone] 01/24/24 0458  WBC 17.2* 9.7  NEUTROABS 13.1*  --   HGB 11.4* 10.4*  HCT 36.1 32.2*  MCV 82.6 79.5*  PLT 280 249   Basic Metabolic Panel: Recent Labs  Lab 01/23/24 0942 01/23/24 1829 01/24/24 0458 01/25/24 0524  NA 133* 132* 132* 135  K 4.8 4.3 4.1 3.9  CL 98 98 99 99  CO2 21* 24 25 25   GLUCOSE 129* 389* 293* 164*  BUN 48* 43* 33* 14  CREATININE 5.81* 3.41* 1.62* 0.77  CALCIUM  8.0* 8.1* 8.6* 9.0  PHOS  --  3.7  --   --    Liver Function Tests: Recent Labs  Lab 01/23/24 0942 01/23/24 1829 01/24/24 0458  AST 65*  --  45*  ALT 50*  --  40  ALKPHOS 180*  --  142*  BILITOT 1.3*  --  0.6  PROT 6.5  --  6.2*  ALBUMIN 3.3* 2.8* 2.9*   CBG: Recent Labs  Lab 01/25/24 0820 01/25/24 1221 01/25/24 1647 01/25/24 2205 01/26/24 0813  GLUCAP 177* 305* 173* 159* 145*    Discharge time spent: 26 minutes.  Length of inpatient stay: 2 days  Signed: Carliss LELON Canales, DO [facility] 01/26/2024

## 2024-01-26 NOTE — Plan of Care (Signed)

## 2024-02-07 ENCOUNTER — Inpatient Hospital Stay (HOSPITAL_COMMUNITY)
Admission: EM | Admit: 2024-02-07 | Discharge: 2024-02-15 | DRG: 392 | Disposition: A | Discharge status: Home / Self Care | Attending: Family Medicine | Admitting: Family Medicine

## 2024-02-07 ENCOUNTER — Encounter (HOSPITAL_COMMUNITY): Payer: Self-pay

## 2024-02-07 ENCOUNTER — Emergency Department (HOSPITAL_COMMUNITY)

## 2024-02-07 ENCOUNTER — Other Ambulatory Visit: Payer: Self-pay

## 2024-02-07 DIAGNOSIS — D62 Acute posthemorrhagic anemia: Secondary | ICD-10-CM | POA: Diagnosis not present

## 2024-02-07 DIAGNOSIS — E66811 Obesity, class 1: Secondary | ICD-10-CM | POA: Diagnosis present

## 2024-02-07 DIAGNOSIS — R6510 Systemic inflammatory response syndrome (SIRS) of non-infectious origin without acute organ dysfunction: Secondary | ICD-10-CM | POA: Diagnosis present

## 2024-02-07 DIAGNOSIS — K5900 Constipation, unspecified: Secondary | ICD-10-CM | POA: Diagnosis not present

## 2024-02-07 DIAGNOSIS — R1010 Upper abdominal pain, unspecified: Secondary | ICD-10-CM | POA: Diagnosis not present

## 2024-02-07 DIAGNOSIS — Z888 Allergy status to other drugs, medicaments and biological substances status: Secondary | ICD-10-CM

## 2024-02-07 DIAGNOSIS — Z6834 Body mass index (BMI) 34.0-34.9, adult: Secondary | ICD-10-CM

## 2024-02-07 DIAGNOSIS — R748 Abnormal levels of other serum enzymes: Secondary | ICD-10-CM | POA: Diagnosis present

## 2024-02-07 DIAGNOSIS — E1165 Type 2 diabetes mellitus with hyperglycemia: Secondary | ICD-10-CM | POA: Diagnosis present

## 2024-02-07 DIAGNOSIS — E11649 Type 2 diabetes mellitus with hypoglycemia without coma: Secondary | ICD-10-CM | POA: Diagnosis not present

## 2024-02-07 DIAGNOSIS — K529 Noninfective gastroenteritis and colitis, unspecified: Principal | ICD-10-CM | POA: Diagnosis present

## 2024-02-07 DIAGNOSIS — I10 Essential (primary) hypertension: Secondary | ICD-10-CM | POA: Diagnosis present

## 2024-02-07 DIAGNOSIS — Z882 Allergy status to sulfonamides status: Secondary | ICD-10-CM | POA: Diagnosis not present

## 2024-02-07 DIAGNOSIS — E785 Hyperlipidemia, unspecified: Secondary | ICD-10-CM | POA: Diagnosis present

## 2024-02-07 DIAGNOSIS — F419 Anxiety disorder, unspecified: Secondary | ICD-10-CM | POA: Diagnosis present

## 2024-02-07 DIAGNOSIS — Z8673 Personal history of transient ischemic attack (TIA), and cerebral infarction without residual deficits: Secondary | ICD-10-CM

## 2024-02-07 DIAGNOSIS — Z791 Long term (current) use of non-steroidal anti-inflammatories (NSAID): Secondary | ICD-10-CM

## 2024-02-07 DIAGNOSIS — Z59819 Housing instability, housed unspecified: Secondary | ICD-10-CM | POA: Diagnosis not present

## 2024-02-07 DIAGNOSIS — A09 Infectious gastroenteritis and colitis, unspecified: Principal | ICD-10-CM | POA: Diagnosis present

## 2024-02-07 DIAGNOSIS — Z9049 Acquired absence of other specified parts of digestive tract: Secondary | ICD-10-CM

## 2024-02-07 DIAGNOSIS — Z800 Family history of malignant neoplasm of digestive organs: Secondary | ICD-10-CM

## 2024-02-07 DIAGNOSIS — Z794 Long term (current) use of insulin: Secondary | ICD-10-CM | POA: Diagnosis not present

## 2024-02-07 DIAGNOSIS — I4892 Unspecified atrial flutter: Secondary | ICD-10-CM | POA: Diagnosis present

## 2024-02-07 DIAGNOSIS — K219 Gastro-esophageal reflux disease without esophagitis: Secondary | ICD-10-CM | POA: Diagnosis present

## 2024-02-07 DIAGNOSIS — M329 Systemic lupus erythematosus, unspecified: Secondary | ICD-10-CM | POA: Diagnosis present

## 2024-02-07 DIAGNOSIS — K625 Hemorrhage of anus and rectum: Secondary | ICD-10-CM

## 2024-02-07 DIAGNOSIS — J479 Bronchiectasis, uncomplicated: Secondary | ICD-10-CM | POA: Diagnosis present

## 2024-02-07 DIAGNOSIS — Z5971 Insufficient health insurance coverage: Secondary | ICD-10-CM

## 2024-02-07 DIAGNOSIS — E871 Hypo-osmolality and hyponatremia: Secondary | ICD-10-CM | POA: Diagnosis present

## 2024-02-07 DIAGNOSIS — K922 Gastrointestinal hemorrhage, unspecified: Secondary | ICD-10-CM | POA: Diagnosis present

## 2024-02-07 DIAGNOSIS — F17210 Nicotine dependence, cigarettes, uncomplicated: Secondary | ICD-10-CM | POA: Diagnosis present

## 2024-02-07 DIAGNOSIS — Z66 Do not resuscitate: Secondary | ICD-10-CM | POA: Diagnosis present

## 2024-02-07 DIAGNOSIS — Z5989 Other problems related to housing and economic circumstances: Secondary | ICD-10-CM

## 2024-02-07 DIAGNOSIS — K229 Disease of esophagus, unspecified: Secondary | ICD-10-CM | POA: Diagnosis present

## 2024-02-07 DIAGNOSIS — R10816 Epigastric abdominal tenderness: Secondary | ICD-10-CM | POA: Diagnosis not present

## 2024-02-07 DIAGNOSIS — F988 Other specified behavioral and emotional disorders with onset usually occurring in childhood and adolescence: Secondary | ICD-10-CM | POA: Diagnosis present

## 2024-02-07 DIAGNOSIS — I480 Paroxysmal atrial fibrillation: Secondary | ICD-10-CM | POA: Diagnosis present

## 2024-02-07 DIAGNOSIS — E878 Other disorders of electrolyte and fluid balance, not elsewhere classified: Secondary | ICD-10-CM | POA: Diagnosis present

## 2024-02-07 DIAGNOSIS — R7401 Elevation of levels of liver transaminase levels: Secondary | ICD-10-CM | POA: Diagnosis present

## 2024-02-07 DIAGNOSIS — Z5941 Food insecurity: Secondary | ICD-10-CM

## 2024-02-07 DIAGNOSIS — Z79899 Other long term (current) drug therapy: Secondary | ICD-10-CM

## 2024-02-07 DIAGNOSIS — D649 Anemia, unspecified: Secondary | ICD-10-CM | POA: Diagnosis not present

## 2024-02-07 DIAGNOSIS — F17290 Nicotine dependence, other tobacco product, uncomplicated: Secondary | ICD-10-CM | POA: Diagnosis present

## 2024-02-07 LAB — CBC WITH DIFFERENTIAL/PLATELET
Abs Immature Granulocytes: 0.09 K/uL — ABNORMAL HIGH (ref 0.00–0.07)
Basophils Absolute: 0.1 K/uL (ref 0.0–0.1)
Basophils Relative: 1 %
Eosinophils Absolute: 0.1 K/uL (ref 0.0–0.5)
Eosinophils Relative: 0 %
HCT: 39.4 % (ref 36.0–46.0)
Hemoglobin: 12.7 g/dL (ref 12.0–15.0)
Immature Granulocytes: 1 %
Lymphocytes Relative: 5 %
Lymphs Abs: 1 K/uL (ref 0.7–4.0)
MCH: 26.1 pg (ref 26.0–34.0)
MCHC: 32.2 g/dL (ref 30.0–36.0)
MCV: 81.1 fL (ref 80.0–100.0)
Monocytes Absolute: 0.9 K/uL (ref 0.1–1.0)
Monocytes Relative: 4 %
Neutro Abs: 17.7 K/uL — ABNORMAL HIGH (ref 1.7–7.7)
Neutrophils Relative %: 89 %
Platelets: 416 K/uL — ABNORMAL HIGH (ref 150–400)
RBC: 4.86 MIL/uL (ref 3.87–5.11)
RDW: 13.9 % (ref 11.5–15.5)
WBC: 19.8 K/uL — ABNORMAL HIGH (ref 4.0–10.5)
nRBC: 0 % (ref 0.0–0.2)

## 2024-02-07 LAB — HEMOGLOBIN AND HEMATOCRIT, BLOOD
HCT: 33.9 % — ABNORMAL LOW (ref 36.0–46.0)
Hemoglobin: 11.2 g/dL — ABNORMAL LOW (ref 12.0–15.0)

## 2024-02-07 LAB — COMPREHENSIVE METABOLIC PANEL WITH GFR
ALT: 35 U/L (ref 0–44)
AST: 26 U/L (ref 15–41)
Albumin: 4.8 g/dL (ref 3.5–5.0)
Alkaline Phosphatase: 150 U/L — ABNORMAL HIGH (ref 38–126)
Anion gap: 13 (ref 5–15)
BUN: 21 mg/dL (ref 8–23)
CO2: 24 mmol/L (ref 22–32)
Calcium: 10.2 mg/dL (ref 8.9–10.3)
Chloride: 94 mmol/L — ABNORMAL LOW (ref 98–111)
Creatinine, Ser: 0.93 mg/dL (ref 0.44–1.00)
GFR, Estimated: >60 mL/min
Glucose, Bld: 358 mg/dL — ABNORMAL HIGH (ref 70–99)
Potassium: 4.9 mmol/L (ref 3.5–5.1)
Sodium: 131 mmol/L — ABNORMAL LOW (ref 135–145)
Total Bilirubin: 0.5 mg/dL (ref 0.0–1.2)
Total Protein: 7.9 g/dL (ref 6.5–8.1)

## 2024-02-07 LAB — GLUCOSE, CAPILLARY
Glucose-Capillary: 182 mg/dL — ABNORMAL HIGH (ref 70–99)
Glucose-Capillary: 368 mg/dL — ABNORMAL HIGH (ref 70–99)

## 2024-02-07 LAB — C-REACTIVE PROTEIN: CRP: 10.4 mg/dL — ABNORMAL HIGH

## 2024-02-07 LAB — PROTIME-INR
INR: 0.9 (ref 0.8–1.2)
Prothrombin Time: 13.2 s (ref 11.4–15.2)

## 2024-02-07 LAB — SEDIMENTATION RATE: Sed Rate: 26 mm/h — ABNORMAL HIGH (ref 0–22)

## 2024-02-07 LAB — CBG MONITORING, ED
Glucose-Capillary: 241 mg/dL — ABNORMAL HIGH (ref 70–99)
Glucose-Capillary: 185 mg/dL — ABNORMAL HIGH (ref 70–99)

## 2024-02-07 LAB — I-STAT CG4 LACTIC ACID, ED: Lactic Acid, Venous: 0.8 mmol/L (ref 0.5–1.9)

## 2024-02-07 LAB — ABO/RH: ABO/RH(D): O POS

## 2024-02-07 LAB — TYPE AND SCREEN
ABO/RH(D): O POS
Antibody Screen: NEGATIVE

## 2024-02-07 LAB — POC OCCULT BLOOD, ED: Fecal Occult Bld: POSITIVE — AB

## 2024-02-07 MED ORDER — CLONAZEPAM 1 MG PO TABS
1.0000 mg | ORAL_TABLET | Freq: Two times a day (BID) | ORAL | Status: DC | PRN
  Administered 2024-02-08 – 2024-02-14 (×11): 1 mg via ORAL
  Filled 2024-02-07 (×11): qty 1

## 2024-02-07 MED ORDER — INSULIN ASPART 100 UNIT/ML IJ SOLN
0.0000 [IU] | Freq: Three times a day (TID) | INTRAMUSCULAR | Status: DC
  Administered 2024-02-07: 1 [IU] via SUBCUTANEOUS
  Administered 2024-02-08: 2 [IU] via SUBCUTANEOUS
  Administered 2024-02-08: 1 [IU] via SUBCUTANEOUS
  Administered 2024-02-08: 2 [IU] via SUBCUTANEOUS
  Administered 2024-02-09: 3 [IU] via SUBCUTANEOUS
  Administered 2024-02-09: 2 [IU] via SUBCUTANEOUS
  Administered 2024-02-10: 3 [IU] via SUBCUTANEOUS
  Administered 2024-02-10: 1 [IU] via SUBCUTANEOUS
  Filled 2024-02-07: qty 1
  Filled 2024-02-07 (×2): qty 2
  Filled 2024-02-07: qty 3
  Filled 2024-02-07 (×2): qty 1
  Filled 2024-02-07: qty 3
  Filled 2024-02-07: qty 2

## 2024-02-07 MED ORDER — HYDROMORPHONE HCL 1 MG/ML IJ SOLN
1.0000 mg | INTRAMUSCULAR | Status: DC | PRN
  Administered 2024-02-12 – 2024-02-15 (×9): 1 mg via INTRAVENOUS
  Filled 2024-02-07 (×9): qty 1

## 2024-02-07 MED ORDER — ONDANSETRON HCL 4 MG/2ML IJ SOLN: 4.0000 mg | Freq: Four times a day (QID) | INTRAMUSCULAR | Status: DC | PRN

## 2024-02-07 MED ORDER — ALBUTEROL SULFATE (2.5 MG/3ML) 0.083% IN NEBU: 2.5000 mg | INHALATION_SOLUTION | Freq: Four times a day (QID) | RESPIRATORY_TRACT | Status: DC | PRN

## 2024-02-07 MED ORDER — MORPHINE SULFATE (PF) 4 MG/ML IV SOLN
4.0000 mg | Freq: Once | INTRAVENOUS | Status: AC
  Administered 2024-02-07: 4 mg via INTRAVENOUS
  Filled 2024-02-07: qty 1

## 2024-02-07 MED ORDER — ACETAMINOPHEN 325 MG PO TABS
650.0000 mg | ORAL_TABLET | Freq: Four times a day (QID) | ORAL | Status: DC | PRN
  Filled 2024-02-07: qty 2

## 2024-02-07 MED ORDER — OXYCODONE-ACETAMINOPHEN 5-325 MG PO TABS
1.0000 | ORAL_TABLET | Freq: Four times a day (QID) | ORAL | Status: DC | PRN
  Administered 2024-02-07 – 2024-02-15 (×17): 1 via ORAL
  Filled 2024-02-07 (×17): qty 1

## 2024-02-07 MED ORDER — HYDROMORPHONE HCL 1 MG/ML IJ SOLN
1.0000 mg | Freq: Once | INTRAMUSCULAR | Status: AC
  Administered 2024-02-07: 1 mg via INTRAVENOUS
  Filled 2024-02-07: qty 1

## 2024-02-07 MED ORDER — GABAPENTIN 300 MG PO CAPS
300.0000 mg | ORAL_CAPSULE | Freq: Three times a day (TID) | ORAL | Status: DC
  Administered 2024-02-07 – 2024-02-15 (×23): 300 mg via ORAL
  Filled 2024-02-07 (×23): qty 1

## 2024-02-07 MED ORDER — IOHEXOL 350 MG/ML SOLN
100.0000 mL | Freq: Once | INTRAVENOUS | Status: AC | PRN
  Administered 2024-02-07: 100 mL via INTRAVENOUS

## 2024-02-07 MED ORDER — DICLOFENAC SODIUM 1 % EX GEL: 4.0000 g | Freq: Every day | CUTANEOUS | Status: DC | PRN

## 2024-02-07 MED ORDER — PAROXETINE HCL 20 MG PO TABS
40.0000 mg | ORAL_TABLET | Freq: Every day | ORAL | Status: DC
  Administered 2024-02-08 – 2024-02-15 (×8): 40 mg via ORAL
  Filled 2024-02-07 (×8): qty 2

## 2024-02-07 MED ORDER — ONDANSETRON HCL 4 MG PO TABS
4.0000 mg | ORAL_TABLET | Freq: Four times a day (QID) | ORAL | Status: DC | PRN
  Administered 2024-02-10 – 2024-02-14 (×2): 4 mg via ORAL
  Filled 2024-02-07 (×2): qty 1

## 2024-02-07 MED ORDER — PANTOPRAZOLE SODIUM 40 MG PO TBEC: 40.0000 mg | DELAYED_RELEASE_TABLET | Freq: Every day | ORAL | Status: DC

## 2024-02-07 MED ORDER — PIPERACILLIN-TAZOBACTAM 3.375 G IVPB
3.3750 g | Freq: Three times a day (TID) | INTRAVENOUS | Status: DC
  Administered 2024-02-07 – 2024-02-15 (×22): 3.375 g via INTRAVENOUS
  Filled 2024-02-07 (×23): qty 50

## 2024-02-07 MED ORDER — SODIUM CHLORIDE 0.9 % IV SOLN: INTRAVENOUS | Status: AC

## 2024-02-07 MED ORDER — INSULIN GLARGINE 100 UNIT/ML ~~LOC~~ SOLN
35.0000 [IU] | Freq: Two times a day (BID) | SUBCUTANEOUS | Status: DC
  Administered 2024-02-07 – 2024-02-10 (×6): 35 [IU] via SUBCUTANEOUS
  Filled 2024-02-07 (×7): qty 0.35

## 2024-02-07 MED ORDER — CYCLOBENZAPRINE HCL 10 MG PO TABS
10.0000 mg | ORAL_TABLET | Freq: Every day | ORAL | Status: DC
  Administered 2024-02-07 – 2024-02-14 (×8): 10 mg via ORAL
  Filled 2024-02-07 (×8): qty 1

## 2024-02-07 MED ORDER — SODIUM CHLORIDE 0.9% FLUSH
3.0000 mL | Freq: Two times a day (BID) | INTRAVENOUS | Status: DC
  Administered 2024-02-07 – 2024-02-15 (×11): 3 mL via INTRAVENOUS

## 2024-02-07 MED ORDER — ROSUVASTATIN CALCIUM 5 MG PO TABS
10.0000 mg | ORAL_TABLET | Freq: Every day | ORAL | Status: DC
  Administered 2024-02-08 – 2024-02-15 (×8): 10 mg via ORAL
  Filled 2024-02-07 (×8): qty 2

## 2024-02-07 MED ORDER — SODIUM CHLORIDE 0.9 % IV BOLUS
1000.0000 mL | Freq: Once | INTRAVENOUS | Status: AC
  Administered 2024-02-07: 1000 mL via INTRAVENOUS

## 2024-02-07 MED ORDER — INSULIN DEGLUDEC 100 UNIT/ML ~~LOC~~ SOPN: 35.0000 [IU] | PEN_INJECTOR | Freq: Two times a day (BID) | SUBCUTANEOUS | Status: DC

## 2024-02-07 MED ORDER — ACETAMINOPHEN 650 MG RE SUPP: 650.0000 mg | Freq: Four times a day (QID) | RECTAL | Status: DC | PRN

## 2024-02-07 MED ORDER — ONDANSETRON HCL 4 MG/2ML IJ SOLN
4.0000 mg | Freq: Once | INTRAMUSCULAR | Status: AC
  Administered 2024-02-07: 4 mg via INTRAVENOUS
  Filled 2024-02-07: qty 2

## 2024-02-07 MED ORDER — PANTOPRAZOLE SODIUM 40 MG PO TBEC
40.0000 mg | DELAYED_RELEASE_TABLET | Freq: Every day | ORAL | Status: DC
  Administered 2024-02-08 – 2024-02-15 (×8): 40 mg via ORAL
  Filled 2024-02-07 (×9): qty 1

## 2024-02-07 MED ORDER — PIPERACILLIN-TAZOBACTAM 3.375 G IVPB 30 MIN
3.3750 g | Freq: Once | INTRAVENOUS | Status: AC
  Administered 2024-02-07: 3.375 g via INTRAVENOUS
  Filled 2024-02-07: qty 50

## 2024-02-07 MED ORDER — BUPROPION HCL ER (XL) 150 MG PO TB24
300.0000 mg | ORAL_TABLET | Freq: Every day | ORAL | Status: DC
  Administered 2024-02-08 – 2024-02-15 (×8): 300 mg via ORAL
  Filled 2024-02-07 (×9): qty 2

## 2024-02-07 MED ORDER — LISINOPRIL 10 MG PO TABS
10.0000 mg | ORAL_TABLET | Freq: Every day | ORAL | Status: DC
  Administered 2024-02-08 – 2024-02-15 (×7): 10 mg via ORAL
  Filled 2024-02-07 (×8): qty 1

## 2024-02-07 MED ORDER — DILTIAZEM HCL ER COATED BEADS 180 MG PO CP24
180.0000 mg | ORAL_CAPSULE | Freq: Every day | ORAL | Status: DC
  Administered 2024-02-08 – 2024-02-15 (×8): 180 mg via ORAL
  Filled 2024-02-07 (×8): qty 1

## 2024-02-07 MED ORDER — INSULIN ASPART 100 UNIT/ML IJ SOLN
6.0000 [IU] | Freq: Once | INTRAMUSCULAR | Status: AC
  Administered 2024-02-07: 6 [IU] via INTRAVENOUS
  Filled 2024-02-07: qty 6

## 2024-02-07 NOTE — ED Notes (Signed)
 Patient transported to CT

## 2024-02-07 NOTE — ED Notes (Signed)
 This NT called CCMD and placed pt on the monitor.

## 2024-02-07 NOTE — ED Provider Notes (Signed)
 " [place] [facility] Provider Note   CSN: [id number] Arrival date & time: 02/07/24  0830     Patient presents with: GI Bleeding   Angela Burch is a 63 y.o. female.   Pt is a 63 yo female with pmhx significant for DM, HTN, CVA, and anxiety.  She called EMS today because she started having lower abd pain, nausea and BRBPR last night.  No blood thinners.       Prior to Admission medications  Medication Sig Start Date End Date Taking? Authorizing Provider  amphetamine -dextroamphetamine  (ADDERALL) 10 MG tablet Take 10 mg by mouth daily with breakfast.    [provider]  amphetamine -dextroamphetamine  (ADDERALL) 30 MG tablet Take 1 tablet by mouth daily. Patient taking differently: Take 30 mg by mouth 2 (two) times daily. 08/02/16   Kristy Sharolyn Lenis, PA-C  buPROPion  (WELLBUTRIN  XL) 150 MG 24 hr tablet Take 150 mg by mouth daily. 05/13/17   [provider]  celecoxib (CELEBREX) 200 MG capsule Take 200 mg by mouth daily.    [provider]  clonazePAM  (KLONOPIN ) 1 MG tablet Take 1 mg by mouth 2 (two) times daily as needed for anxiety. 03/18/17   [provider]  cyclobenzaprine (FLEXERIL) 10 MG tablet Take 10 mg by mouth at bedtime. 12/04/23   [provider]  diclofenac  Sodium (VOLTAREN ) 1 % GEL Apply 4 g topically 4 (four) times daily. Patient taking differently: Apply 4 g topically daily as needed (for pain). 07/12/23   Emil Share, DO  diltiazem  (CARDIZEM  CD) 180 MG 24 hr capsule Take 180 mg by mouth daily. 10/04/23   [provider]  gabapentin  (NEURONTIN ) 300 MG capsule Take 300 mg by mouth 3 (three) times daily. 12/28/22   [provider]  insulin  lispro (HUMALOG ) 100 UNIT/ML KwikPen Inject 3 Units into the skin with breakfast, with lunch, and with evening meal. Plus SSI: CBG 70 - 120: 0 units CBG 121 - 150: 2 units CBG 151 - 200: 3 units CBG 201 - 250: 5 units CBG 251 - 300: 8  units CBG 301 - 350: 11 units CBG 351 - 400: 15 units 07/10/23   Laurence Locus, DO  Insulin  Pen Needle (BD PEN NEEDLE MINI ULTRAFINE) 31G X 5 MM MISC use to inject insulin  as directed 07/10/23   Laurence Locus, DO  lisinopril  (ZESTRIL ) 10 MG tablet Take 1 tablet (10 mg total) by mouth daily. 07/10/23 01/25/24  Laurence Locus, DO  meclizine  (ANTIVERT ) 25 MG tablet Take 25 mg by mouth 2 (two) times daily as needed for dizziness.    [provider]  naloxone [facility]) nasal spray 4 mg/0.1 mL Place 1 spray into the nose daily as needed (for overdose). 01/03/24   [provider]  omeprazole (PRILOSEC) 40 MG capsule Take 40 mg by mouth daily as needed (Patient does not have insurance which leads her to take her meds intermittently to save money). 12/01/22   [provider]  ondansetron  (ZOFRAN ) 8 MG tablet Take 8 mg by mouth every 8 (eight) hours as needed for vomiting or nausea.    [provider]  oxyCODONE  (OXY IR/ROXICODONE ) 5 MG immediate release tablet Take 5 mg by mouth 4 (four) times daily as needed for moderate pain (pain score 4-6).    [provider]  PARoxetine  (PAXIL ) 40 MG tablet Take 40 mg by mouth daily. 07/13/22   [provider]  rosuvastatin  (CRESTOR ) 10 MG tablet Take 10 mg by  mouth daily. 12/02/22   [provider]  TRESIBA FLEXTOUCH 100 UNIT/ML FlexTouch Pen Inject 35 Units into the skin 2 (two) times daily.    [provider]    Allergies: Sulfa antibiotics and Metformin  and related    Review of Systems  Gastrointestinal:  Positive for abdominal pain and blood in stool.  All other systems reviewed and are negative.   Updated Vital Signs BP (!) 119/95   Pulse 96   Temp 98.2 F (36.8 C) (Oral)   Resp 12   SpO2 92%   Physical Exam Vitals and nursing note reviewed.  Constitutional:      Appearance: Normal appearance.  HENT:     Head: Normocephalic and atraumatic.     Right Ear: External ear normal.     Left Ear:  External ear normal.     Nose: Nose normal.     Mouth/Throat:     Mouth: Mucous membranes are moist.  Eyes:     Extraocular Movements: Extraocular movements intact.     Conjunctiva/sclera: Conjunctivae normal.     Pupils: Pupils are equal, round, and reactive to light.  Cardiovascular:     Rate and Rhythm: Normal rate and regular rhythm.     Pulses: Normal pulses.     Heart sounds: Normal heart sounds.  Abdominal:     General: Abdomen is flat. Bowel sounds are normal.     Palpations: Abdomen is soft.  Genitourinary:    Rectum: Guaiac result positive.  Musculoskeletal:        General: Normal range of motion.  Skin:    General: Skin is warm.     Capillary Refill: Capillary refill takes less than 2 seconds.  Neurological:     General: No focal deficit present.     Mental Status: She is alert and oriented to person, place, and time.  Psychiatric:        Mood and Affect: Mood normal.        Behavior: Behavior normal.     (all labs ordered are listed, but only abnormal results are displayed) Labs Reviewed  COMPREHENSIVE METABOLIC PANEL WITH GFR - Abnormal; Notable for the following components:      Result Value   Sodium 131 (*)    Chloride 94 (*)    Glucose, Bld 358 (*)    Alkaline Phosphatase 150 (*)    All other components within normal limits  CBC WITH DIFFERENTIAL/PLATELET - Abnormal; Notable for the following components:   WBC 19.8 (*)    Platelets 416 (*)    Neutro Abs 17.7 (*)    Abs Immature Granulocytes 0.09 (*)    All other components within normal limits  POC OCCULT BLOOD, ED - Abnormal; Notable for the following components:   Fecal Occult Bld POSITIVE (*)    All other components within normal limits  CBG MONITORING, ED - Abnormal; Notable for the following components:   Glucose-Capillary 241 (*)    All other components within normal limits  CULTURE, BLOOD (ROUTINE X 2)  CULTURE, BLOOD (ROUTINE X 2)  PROTIME-INR  I-STAT CG4 LACTIC ACID, ED  I-STAT CG4  LACTIC ACID, ED  TYPE AND SCREEN  ABO/RH    EKG: EKG Interpretation Date/Time:  Friday February 07 2024 08:42:25 EST Ventricular Rate:  74 PR Interval:  140 QRS Duration:  73 QT Interval:  388 QTC Calculation: 431 R Axis:   -43  Text Interpretation: Sinus rhythm Left axis deviation Low voltage, precordial leads No significant change since last  tracing Confirmed by Dean Clarity [phone]) on 02/07/2024 8:54:11 AM  Radiology: CT Angio Abd/Pel W and/or Wo Contrast Result Date: 02/07/2024 CLINICAL DATA:  Lower GI bleed EXAM: CTA ABDOMEN AND PELVIS WITHOUT AND WITH CONTRAST TECHNIQUE: Initially, a noncontrast CT of the abdomen and pelvis was performed. Subsequently, multidetector CT imaging of the abdomen and pelvis was performed using the standard protocol during bolus administration of intravenous contrast. Multiplanar reconstructed images and MIPs were obtained and reviewed to evaluate the vascular anatomy. RADIATION DOSE REDUCTION: This exam was performed according to the departmental dose-optimization program which includes automated exposure control, adjustment of the mA and/or kV according to patient size and/or use of iterative reconstruction technique. CONTRAST:  OMNIPAQUE  IOHEXOL  350 MG/ML SOLN COMPARISON:  Jul 12, 2023, Jul 09, 2023 FINDINGS: VASCULAR Aorta: No aortic aneurysm, intramural hematoma, or aortic dissection. Mixed density plaque scattered throughout the abdominal aorta. No hemodynamically significant stenosis. Celiac: Patent without acute thrombus, aneurysm, or dissection.No hemodynamically significant stenosis. SMA: Patent without acute thrombus, aneurysm, or dissection.No hemodynamically significant stenosis. Renals: Patent without acute thrombus, aneurysm, or dissection.Mild narrowing of the proximal right renal artery from mixed density plaque. IMA: Patent without acute thrombus, aneurysm, or dissection.No hemodynamically significant stenosis. Inflow: Patent without  acute thrombus, aneurysm, or dissection.Minimal scattered calcified plaque without hemodynamically significant stenosis. Proximal Outflow: The bilateral common femoral and visualized portions of the superficial and profunda femoral arteries are patent without acute thrombus, aneurysm, or dissection.No hemodynamically significant stenosis. Veins: The SMV, portal, and hepatic veins are patent without thrombus. The suprarenal IVC is also widely patent. Review of the MIP images confirms the above findings. NON-VASCULAR Lower chest: No focal airspace consolidation or pleural effusion.Fibrolinear scarring and reticulation again noted in both lung bases, unchanged. Minimal bronchiolectasis also present, unchanged. Hepatobiliary: No mass.Cholecystectomy.Mild dilation of the intrahepatic and extrahepatic bile ducts, likely related to the prior cholecystectomy. Pancreas: Mild parenchymal atrophy of the pancreatic parenchyma. No mass or ductal dilation.No peripancreatic inflammation or fluid collection. Spleen: Normal size. No mass. Adrenals/Urinary Tract: No adrenal masses. No renal mass. No hydronephrosis or nephrolithiasis. The urinary bladder is distended without focal abnormality. Stomach/Bowel: Hyperdense ingested material within the stomach. No small bowel wall thickening or inflammation. No small bowel obstruction.The appendix was not visualized. No right lower quadrant or pericecal inflammatory changes to suggest acute appendicitis. Moderate to severe wall thickening throughout the descending and sigmoid colon with fairly diffuse pericolonic inflammation. Scattered colonic diverticulosis also present. GI Bleed: No extravasation of contrast to suggest active GI bleeding. Lymphatic: No intraabdominal or pelvic lymphadenopathy. Reproductive: Age-related atrophy of the uterus and ovaries. No concerning adnexal mass.No free pelvic fluid. Other: No pneumoperitoneum or ascites. Musculoskeletal: No acute fracture or  destructive lesion.Remote, left-sided rib fractures. Multilevel degenerative disc disease of the spine. Thoracic DISH. IMPRESSION: VASCULAR No aortic aneurysm, intramural hematoma, or aortic dissection. NON-VASCULAR Moderate to severe wall thickening throughout the descending and sigmoid colon with diffuse pericolonic inflammation, consistent with an infectious or inflammatory colitis. No active GI bleeding. Aortic Atherosclerosis (ICD10-I70.0). Electronically Signed   By: Rogelia Myers M.D.   On: 02/07/2024 12:32   DG Chest Port 1 View Result Date: 02/07/2024 EXAM: 1 VIEW(S) XRAY OF THE CHEST 02/07/2024 08:50:00 AM COMPARISON: 01/23/2024 CLINICAL HISTORY: GI bleed FINDINGS: LUNGS AND PLEURA: Low lung volumes. Left basilar linear atelectasis/scarring. No pleural effusion. No pneumothorax. HEART AND MEDIASTINUM: Aortic calcification. No acute abnormality of the cardiac and mediastinal silhouettes. BONES AND SOFT TISSUES: No acute osseous abnormality. IMPRESSION: 1. No acute cardiopulmonary abnormality.  2. Low lung volumes with left basilar linear atelectasis/scarring. Electronically signed by: Norleen Boxer MD 02/07/2024 09:25 AM EST RP Workstation: [id number]     Procedures   Medications Ordered in the ED  sodium chloride  0.9 % bolus 1,000 mL (0 mLs Intravenous Stopped 02/07/24 1137)  ondansetron  (ZOFRAN ) injection 4 mg (4 mg Intravenous Given 02/07/24 0915)  morphine  (PF) 4 MG/ML injection 4 mg (4 mg Intravenous Given 02/07/24 0916)  morphine  (PF) 4 MG/ML injection 4 mg (4 mg Intravenous Given 02/07/24 1057)  iohexol  (OMNIPAQUE ) 350 MG/ML injection 100 mL (100 mLs Intravenous Contrast Given 02/07/24 1052)  piperacillin-tazobactam (ZOSYN) IVPB 3.375 g (0 g Intravenous Stopped 02/07/24 1400)  HYDROmorphone  (DILAUDID ) injection 1 mg (1 mg Intravenous Given 02/07/24 1325)  insulin  aspart (novoLOG ) injection 6 Units (6 Units Intravenous Given 02/07/24 1323)  sodium chloride  0.9 % bolus 1,000 mL (0  mLs Intravenous Stopped 02/07/24 1452)                                    Medical Decision Making Amount and/or Complexity of Data Reviewed Labs: ordered. Radiology: ordered.  Risk Prescription drug management. Decision regarding hospitalization.   This patient presents to the ED for concern of brbpr, this involves an extensive number of treatment options, and is a complaint that carries with it a high risk of complications and morbidity.  The differential diagnosis includes diverticular bleed, hemorrhoid, colitis, diverticulitis   Co morbidities that complicate the patient evaluation  DM, HTN, CVA, and anxiety   Additional history obtained:  Additional history obtained from epic chart review External records from outside source obtained and reviewed including EMS report   Lab Tests:  I Ordered, and personally interpreted labs.  The pertinent results include:  cbc with wbc elevated at 19.8; cmp with glucose elevated at 358; inr 0.9   Imaging Studies ordered:  I ordered imaging studies including cxr and ct abd/pelvis  I independently visualized and interpreted imaging which showed  CXR: 1. No acute cardiopulmonary abnormality.  2. Low lung volumes with left basilar linear atelectasis/scarring.  CTA Abd/pelvis: IMPRESSION:  VASCULAR    No aortic aneurysm, intramural hematoma, or aortic dissection.    NON-VASCULAR    Moderate to severe wall thickening throughout the descending and  sigmoid colon with diffuse pericolonic inflammation, consistent with  an infectious or inflammatory colitis. No active GI bleeding.    Aortic Atherosclerosis (ICD10-I70.0).   I agree with the radiologist interpretation   Cardiac Monitoring:  The patient was maintained on a cardiac monitor.  I personally viewed and interpreted the cardiac monitored which showed an underlying rhythm of: nsr   Medicines ordered and prescription drug management:  I ordered medication including  morphine /zofran /dilaudid /zosyn  for sx  Reevaluation of the patient after these medicines showed that the patient improved I have reviewed the patients home medicines and have made adjustments as needed   Test Considered:  ct   Critical Interventions:  abx   Consultations Obtained:  I requested consultation with the hospitalist (Dr. FABIENE Sharps),  and discussed lab and imaging findings as well as pertinent plan - he will admit   Problem List / ED Course:  Colitis:  pt given ivfs and iv abx and iv pain meds.  She still feels terrible   Reevaluation:  After the interventions noted above, I reevaluated the patient and found that they have :improved   Social Determinants of Health:  Lives  alone   Dispostion:  After consideration of the diagnostic results and the patients response to treatment, I feel that the patent would benefit from admission.       Final diagnoses:  Colitis  Rectal bleeding  Hyperglycemia due to diabetes mellitus [facility])    ED Discharge Orders     None          Dean Clarity, MD 02/07/24 1456  "

## 2024-02-07 NOTE — ED Triage Notes (Signed)
 Pt arrived via [facility] from home for c/o bright red blood after BM in toilet started yesterday. Pt c/o nausea, RLQ, LLQ abdominal cramping, dizziness, and generalized weakness. Pt denies blood thinners.

## 2024-02-07 NOTE — H&P (Addendum)
 " History and Physical    Patient: Angela Burch FMW:[id number] DOB: 1961/01/22 DOA: 02/07/2024 DOS: the patient was seen and examined on 02/07/2024 PCP: Sherre Geni LABOR, NP  Patient coming from: Home  Chief Complaint:  Chief Complaint  Patient presents with   GI Bleeding   HPI: Angela Burch is a 63 y.o. female with medical history significant of hypertension, hyperlipidemia, atrial flutter, diabetes,  bronchiectasis, lupus, and anxiety presents with abdominal pain and rectal bleeding.  Patient had just recently been hospitalized from 12/4-12/7, after being found down noted to be acute renal failure secondary to rhabdomyolysis which improved with IV fluid rehydration.  There was concern for the possibility of polypharmacy as the cause which patient was to follow-up with her primary in regards to adjustment in medication regimen.  She began experiencing symptoms yesterday afternoon after consuming fried chicken from [employer]. Initially, she had loose stools, which progressed to passing bright red blood. She describes significant pain in her upper and lower abdomen, with the lower abdomen being more tender.  Her current insulin  regimen includes Lispro, 3 units as needed, and a long-acting insulin , 35 units twice daily. She also reports dizziness and nausea but no vomiting. Her stools have continued to be bloody, similar to the previous day.  Denies any recent antibiotic use in the last few months.  Over the phone her daughter makes note that she has been having issues with her bowels for some time that t she thought was possibly related to her lupus diagnosis.  In the emergency department patient was noted to be afebrile with heart rate 74-1 03, blood pressures elevated up to 174/68, and all other vital signs maintained.  Labs significant for WBC 19.8, platelets 416, sodium 131, chloride 94, glucose 358,  alkaline phosphatase 150, and lactic acid 0.8.  Stool guaiacs were noted to be  positive.  CT angiogram of the abdomen pelvis noted moderate to severe wall thickening throughout the descending and sigmoid colon with diffuse pericolonic inflammation consistent with infectious or inflammatory colitis with no active GI bleeding appreciated.  Blood cultures were obtained.  Patient had been given 2 L of normal saline IV fluids, Zofran , morphine , Dilaudid , insulin  6 units IV, and Zosyn .  Review of Systems: As mentioned in the history of present illness. All other systems reviewed and are negative. Past Medical History:  Diagnosis Date   Anxiety    CVA (cerebral vascular accident) (HCC) 01/03/2023   Diabetes mellitus without complication (HCC)    DKA, type 2 (HCC) 07/09/2023   Hypertension    Past Surgical History:  Procedure Laterality Date   APPENDECTOMY     CHOLECYSTECTOMY     Social History:  reports that she has been smoking cigarettes. She has never used smokeless tobacco. She reports that she does not drink alcohol and does not use drugs.  Allergies[1]  History reviewed. No pertinent family history.  Prior to Admission medications  Medication Sig Start Date End Date Taking? Authorizing Provider  amphetamine -dextroamphetamine  (ADDERALL) 10 MG tablet Take 10 mg by mouth daily with breakfast.    [provider]  amphetamine -dextroamphetamine  (ADDERALL) 30 MG tablet Take 1 tablet by mouth daily. Patient taking differently: Take 30 mg by mouth 2 (two) times daily. 08/02/16   Kristy Sharolyn Lenis, PA-C  buPROPion  (WELLBUTRIN  XL) 150 MG 24 hr tablet Take 150 mg by mouth daily. 05/13/17   [provider]  celecoxib (CELEBREX) 200 MG capsule Take 200 mg by mouth daily.    [provider]  clonazePAM  (KLONOPIN ) 1 MG tablet Take 1 mg by mouth 2 (two) times daily as needed for anxiety. 03/18/17   [provider]  cyclobenzaprine  (FLEXERIL ) 10 MG tablet Take 10 mg by mouth at bedtime. 12/04/23   [provider]  diclofenac  Sodium  (VOLTAREN ) 1 % GEL Apply 4 g topically 4 (four) times daily. Patient taking differently: Apply 4 g topically daily as needed (for pain). 07/12/23   Emil Share, DO  diltiazem  (CARDIZEM  CD) 180 MG 24 hr capsule Take 180 mg by mouth daily. 10/04/23   [provider]  gabapentin  (NEURONTIN ) 300 MG capsule Take 300 mg by mouth 3 (three) times daily. 12/28/22   [provider]  insulin  lispro (HUMALOG ) 100 UNIT/ML KwikPen Inject 3 Units into the skin with breakfast, with lunch, and with evening meal. Plus SSI: CBG 70 - 120: 0 units CBG 121 - 150: 2 units CBG 151 - 200: 3 units CBG 201 - 250: 5 units CBG 251 - 300: 8 units CBG 301 - 350: 11 units CBG 351 - 400: 15 units 07/10/23   Laurence Locus, DO  Insulin  Pen Needle (BD PEN NEEDLE MINI ULTRAFINE) 31G X 5 MM MISC use to inject insulin  as directed 07/10/23   Laurence Locus, DO  lisinopril  (ZESTRIL ) 10 MG tablet Take 1 tablet (10 mg total) by mouth daily. 07/10/23 01/25/24  Laurence Locus, DO  meclizine  (ANTIVERT ) 25 MG tablet Take 25 mg by mouth 2 (two) times daily as needed for dizziness.    [provider]  naloxone [facility]) nasal spray 4 mg/0.1 mL Place 1 spray into the nose daily as needed (for overdose). 01/03/24   [provider]  omeprazole (PRILOSEC) 40 MG capsule Take 40 mg by mouth daily as needed (Patient does not have insurance which leads her to take her meds intermittently to save money). 12/01/22   [provider]  ondansetron  (ZOFRAN ) 8 MG tablet Take 8 mg by mouth every 8 (eight) hours as needed for vomiting or nausea.    [provider]  oxyCODONE  (OXY IR/ROXICODONE ) 5 MG immediate release tablet Take 5 mg by mouth 4 (four) times daily as needed for moderate pain (pain score 4-6).    [provider]  PARoxetine  (PAXIL ) 40 MG tablet Take 40 mg by mouth daily. 07/13/22   [provider]  rosuvastatin  (CRESTOR ) 10 MG tablet Take 10 mg by mouth daily. 12/02/22   [provider]   TRESIBA  FLEXTOUCH 100 UNIT/ML FlexTouch Pen Inject 35 Units into the skin 2 (two) times daily.    [provider]    Physical Exam: Vitals:   02/07/24 1250 02/07/24 1345 02/07/24 1415 02/07/24 1430  BP:  (!) 127/58  (!) 119/95  Pulse:  99 95 96  Resp:  17 14 12   Temp: 98.2 F (36.8 C)     TempSrc: Oral     SpO2:  91% 92% 92%    Constitutional: Older female, NAD, calm, comfortable Eyes: PERRL, lids and conjunctivae normal ENMT: Mucous membranes are moist.   Fair dentition Neck: normal, supple,  Respiratory: clear to auscultation bilaterally, no wheezing, no crackles. Normal respiratory effort. No accessory muscle use.  Cardiovascular: Regular rate and rhythm, no murmurs / rubs / gallops. No extremity edema.  Abdomen:Generalized tenderness to palpation noted.  Bowel sounds positive.  Musculoskeletal: no clubbing / cyanosis. No joint deformity upper and lower extremities. Good ROM, no contractures. Normal muscle tone.  Skin: no rashes, lesions, ulcers.   Neurologic:  CN 2-12 grossly intact.  Strength 5/5 in all 4.  Psychiatric: Normal judgment and insight. Alert and oriented x 3. Normal mood.   Data Reviewed:  EKG reveals sinus rhythm at 74 bpm with left axis deviation.  Reviewed labs, imaging, and pertinent records as documented.  Assessment and Plan:  SIRS Colitis with rectal bleeding Acute.  Patient presented with complaints of abdominal pain with rectal bleeding.  Stool guaiacs were positive but hemoglobin was noted to be 12.7.  Patient was noted to be tachycardic and tachypneic meeting SIRS criteria.  CT angiogram of the abdomen pelvis noted moderate to severe wall thickening throughout the descending and sigmoid colon with diffuse pericolonic inflammation.  Lactic acid was reassuring at 0.8. - Admit to a telemetry bed - Monitor intake and output - Check GI panel - Check ESR and CRP - Continue to monitor H&H - Continue empiric antibiotics of Zosyn  -  Oxycodone /Dilaudid  IV as needed for mild to moderate pain respectively - Antiemetics as needed -  Patient advised of need of colonoscopy in 4-6 weeks  Uncontrolled diabetes mellitus type 2 with hyperglycemia, with long-term use of insulin  Admission glucose noted to be elevated at 358.  Last available hemoglobin A1c noted to be 11.4 when checked 01/24/2024. - Hypoglycemia protocols - Continue Tresiba  35 units twice daily - CBG q AC with very sensitive SSI - Adjust insulin  regimen as deemed medically appropriate   Hyponatremia Hypochloremia Acute.  Sodium 131 and chloride 94.  Patient has been bolused 1 L normal saline IV fluids. - Continue normal saline IV fluids at 75 mL/h - Continue to monitor   Lupus  Patient notes recent diagnosis of has a follow-up appointment with the rheumatologist sometime next year. - Continue Gabapentin  - Held Celebrex in the setting of acute bleeding  Essential hypertension Blood pressures initially noted to be elevated up to 174/68. - Continue Cardizem  and lisinopril   Anxiety - Continue Wellbutrin , Paxil , and clonazepam  as needed  GERD  - Continue pharmacy substitution Protonix   DVT prophylaxis: SCDs Advance Care Planning:   Code Status: Full Code    Consults:  none   Family Communication: Daughter updated over the phone    Severity of Illness: The appropriate patient status for this patient is INPATIENT. Inpatient status is judged to be reasonable and necessary in order to provide the required intensity of service to ensure the patient's safety. The patient's presenting symptoms, physical exam findings, and initial radiographic and laboratory data in the context of their chronic comorbidities is felt to place them at high risk for further clinical deterioration. Furthermore, it is not anticipated that the patient will be medically stable for discharge from the hospital within 2 midnights of admission.   * I certify that at the point of admission  it is my clinical judgment that the patient will require inpatient hospital care spanning beyond 2 midnights from the point of admission due to high intensity of service, high risk for further deterioration and high frequency of surveillance required.*  Author: Maximino DELENA Sharps, MD 02/07/2024 2:48 PM  For on call review [email or web address].      [1]  Allergies Allergen Reactions   Sulfa Antibiotics Anaphylaxis and Hives   Metformin  And Related Diarrhea   "

## 2024-02-08 DIAGNOSIS — K625 Hemorrhage of anus and rectum: Secondary | ICD-10-CM | POA: Diagnosis not present

## 2024-02-08 DIAGNOSIS — K529 Noninfective gastroenteritis and colitis, unspecified: Secondary | ICD-10-CM | POA: Diagnosis not present

## 2024-02-08 LAB — GLUCOSE, CAPILLARY
Glucose-Capillary: 163 mg/dL — ABNORMAL HIGH (ref 70–99)
Glucose-Capillary: 233 mg/dL — ABNORMAL HIGH (ref 70–99)
Glucose-Capillary: 217 mg/dL — ABNORMAL HIGH (ref 70–99)
Glucose-Capillary: 192 mg/dL — ABNORMAL HIGH (ref 70–99)

## 2024-02-08 LAB — CBC
HCT: 35.1 % — ABNORMAL LOW (ref 36.0–46.0)
Hemoglobin: 11.5 g/dL — ABNORMAL LOW (ref 12.0–15.0)
MCH: 26.3 pg (ref 26.0–34.0)
MCHC: 32.8 g/dL (ref 30.0–36.0)
MCV: 80.3 fL (ref 80.0–100.0)
Platelets: 358 K/uL (ref 150–400)
RBC: 4.37 MIL/uL (ref 3.87–5.11)
RDW: 14.2 % (ref 11.5–15.5)
WBC: 15.3 K/uL — ABNORMAL HIGH (ref 4.0–10.5)
nRBC: 0 % (ref 0.0–0.2)

## 2024-02-08 LAB — BASIC METABOLIC PANEL WITH GFR
Anion gap: 9 (ref 5–15)
BUN: 10 mg/dL (ref 8–23)
CO2: 25 mmol/L (ref 22–32)
Calcium: 8.8 mg/dL — ABNORMAL LOW (ref 8.9–10.3)
Chloride: 102 mmol/L (ref 98–111)
Creatinine, Ser: 0.7 mg/dL (ref 0.44–1.00)
GFR, Estimated: >60 mL/min
Glucose, Bld: 175 mg/dL — ABNORMAL HIGH (ref 70–99)
Potassium: 3.8 mmol/L (ref 3.5–5.1)
Sodium: 136 mmol/L (ref 135–145)

## 2024-02-08 NOTE — Progress Notes (Signed)
 " PROGRESS NOTE    Angela Burch  FMW:[id number] DOB: 1960-12-30 DOA: 02/07/2024 PCP: Sherre Geni LABOR, NP    Brief Narrative:   Angela Burch is a 63 y.o. female with medical history significant of hypertension, hyperlipidemia, atrial flutter, diabetes,  bronchiectasis, lupus, and anxiety presents with abdominal pain and rectal bleeding.   Patient had just recently been hospitalized from 12/4-12/7, after being found down noted to be acute renal failure secondary to rhabdomyolysis which improved with IV fluid rehydration.  There was concern for the possibility of polypharmacy as the cause which patient was to follow-up with her primary in regards to adjustment in medication regimen.  Assessment and Plan: SIRS Colitis with rectal bleeding Acute.  Patient presented with complaints of abdominal pain with rectal bleeding.  Stool guaiacs were positive but hemoglobin was noted to be 12.7.  Patient was noted to be tachycardic and tachypneic meeting SIRS criteria.  CT angiogram of the abdomen pelvis noted moderate to severe wall thickening throughout the descending and sigmoid colon with diffuse pericolonic inflammation.  Lactic acid was reassuring at 0.8. - Check GI panel - Check ESR and CRP - Continue empiric antibiotics of Zosyn  -Trend hemoglobin - Oxycodone /Dilaudid  IV as needed for mild to moderate pain respectively - Antiemetics as needed -  Patient advised of need of colonoscopy in 4-6 weeks   Uncontrolled diabetes mellitus type 2 with hyperglycemia, with long-term use of insulin  Admission glucose noted to be elevated at 358.  Last available hemoglobin A1c noted to be 11.4 when checked 01/24/2024. - Hypoglycemia protocols - Continue Tresiba  35 units twice daily - CBG q AC with very sensitive SSI - Adjust insulin  regimen as deemed medically appropriate    Hyponatremia Hypochloremia Acute.  Sodium 131 and chloride 94.  Patient has been bolused 1 L normal saline IV fluids. - Continue  normal saline IV fluids at 75 mL/h    Lupus  Patient notes recent diagnosis of has a follow-up appointment with the rheumatologist sometime next year. - Continue Gabapentin  - Held Celebrex in the setting of acute bleeding   Essential hypertension - Continue Cardizem  and lisinopril    Anxiety - Wellbutrin , Paxil , and clonazepam  as needed   GERD  - Protonix      DVT prophylaxis: SCDs Start: 02/07/24 1506    Code Status: Do not attempt resuscitation (DNR) PRE-ARREST INTERVENTIONS DESIRED Family Communication:   Disposition Plan:  Level of care: Telemetry Status is: Inpatient     Consultants:  None   Subjective: No further bleeding overnight Stomach sore  Objective: Vitals:   02/07/24 1600 02/07/24 1732 02/08/24 0500 02/08/24 0744  BP: (!) 115/96 (!) 114/53 128/72 (!) 152/65  Pulse: 67 91 81 90  Resp: 20 16  16   Temp:  98.2 F (36.8 C) 98.6 F (37 C) 98.7 F (37.1 C)  TempSrc:  Oral Oral   SpO2: (!) 75% 95% 100% 91%    Intake/Output Summary (Last 24 hours) at 02/08/2024 1218 Last data filed at 02/08/2024 0747 Gross per 24 hour  Intake 1674.75 ml  Output --  Net 1674.75 ml   There were no vitals filed for this visit.  Examination:   General: Appearance:    Obese female in no acute distress     Lungs:     respirations unlabored  Heart:    Normal heart rate    MS:   All extremities are intact.    Neurologic:   Awake, alert       Data Reviewed: I have  personally reviewed following labs and imaging studies  CBC: Recent Labs  Lab 02/07/24 0907 02/07/24 2022 02/08/24 0658  WBC 19.8*  --  15.3*  NEUTROABS 17.7*  --   --   HGB 12.7 11.2* 11.5*  HCT 39.4 33.9* 35.1*  MCV 81.1  --  80.3  PLT 416*  --  358   Basic Metabolic Panel: Recent Labs  Lab 02/07/24 0907 02/08/24 0658  NA 131* 136  K 4.9 3.8  CL 94* 102  CO2 24 25  GLUCOSE 358* 175*  BUN 21 10  CREATININE 0.93 0.70  CALCIUM  10.2 8.8*   GFR: CrCl cannot be calculated  (Unknown ideal weight.). Liver Function Tests: Recent Labs  Lab 02/07/24 0907  AST 26  ALT 35  ALKPHOS 150*  BILITOT 0.5  PROT 7.9  ALBUMIN 4.8   No results for input(s): LIPASE, AMYLASE in the last 168 hours. No results for input(s): AMMONIA in the last 168 hours. Coagulation Profile: Recent Labs  Lab 02/07/24 0907  INR 0.9   Cardiac Enzymes: No results for input(s): CKTOTAL, CKMB, CKMBINDEX, TROPONINI in the last 168 hours. BNP (last 3 results) No results for input(s): PROBNP in the last 8760 hours. HbA1C: No results for input(s): HGBA1C in the last 72 hours. CBG: Recent Labs  Lab 02/07/24 1322 02/07/24 1520 02/07/24 1730 02/07/24 2138 02/08/24 0647  GLUCAP 241* 185* 182* 368* 163*   Lipid Profile: No results for input(s): CHOL, HDL, LDLCALC, TRIG, CHOLHDL, LDLDIRECT in the last 72 hours. Thyroid Function Tests: No results for input(s): TSH, T4TOTAL, FREET4, T3FREE, THYROIDAB in the last 72 hours. Anemia Panel: No results for input(s): VITAMINB12, FOLATE, FERRITIN, TIBC, IRON, RETICCTPCT in the last 72 hours. Sepsis Labs: Recent Labs  Lab 02/07/24 1325  LATICACIDVEN 0.8    Recent Results (from the past 240 hours)  Culture, blood (routine x 2)     Status: None (Preliminary result)   Collection Time: 02/07/24  1:15 PM   Specimen: BLOOD RIGHT FOREARM  Result Value Ref Range Status   Specimen Description BLOOD RIGHT FOREARM  Final   Special Requests   Final    BOTTLES DRAWN AEROBIC AND ANAEROBIC Blood Culture results may not be optimal due to an inadequate volume of blood received in culture bottles   Culture   Final    NO GROWTH < 24 HOURS Performed at [facility], [address] [place], [place] [address]    Report Status PENDING  Incomplete  Culture, blood (routine x 2)     Status: None (Preliminary result)   Collection Time: 02/07/24  8:22 PM   Specimen: BLOOD  Result Value Ref Range Status    Specimen Description BLOOD RIGHT ANTECUBITAL  Final   Special Requests   Final    BOTTLES DRAWN AEROBIC AND ANAEROBIC Blood Culture adequate volume   Culture   Final    NO GROWTH < 12 HOURS Performed at [facility], [address], [place], [place] [address]    Report Status PENDING  Incomplete         Radiology Studies: CT Angio Abd/Pel W and/or Wo Contrast Result Date: 02/07/2024 CLINICAL DATA:  Lower GI bleed EXAM: CTA ABDOMEN AND PELVIS WITHOUT AND WITH CONTRAST TECHNIQUE: Initially, a noncontrast CT of the abdomen and pelvis was performed. Subsequently, multidetector CT imaging of the abdomen and pelvis was performed using the standard protocol during bolus administration of intravenous contrast. Multiplanar reconstructed images and MIPs were obtained and reviewed to evaluate  the vascular anatomy. RADIATION DOSE REDUCTION: This exam was performed according to the departmental dose-optimization program which includes automated exposure control, adjustment of the mA and/or kV according to patient size and/or use of iterative reconstruction technique. CONTRAST:  OMNIPAQUE  IOHEXOL  350 MG/ML SOLN COMPARISON:  Jul 12, 2023, Jul 09, 2023 FINDINGS: VASCULAR Aorta: No aortic aneurysm, intramural hematoma, or aortic dissection. Mixed density plaque scattered throughout the abdominal aorta. No hemodynamically significant stenosis. Celiac: Patent without acute thrombus, aneurysm, or dissection.No hemodynamically significant stenosis. SMA: Patent without acute thrombus, aneurysm, or dissection.No hemodynamically significant stenosis. Renals: Patent without acute thrombus, aneurysm, or dissection.Mild narrowing of the proximal right renal artery from mixed density plaque. IMA: Patent without acute thrombus, aneurysm, or dissection.No hemodynamically significant stenosis. Inflow: Patent without acute thrombus, aneurysm, or dissection.Minimal scattered calcified plaque without hemodynamically  significant stenosis. Proximal Outflow: The bilateral common femoral and visualized portions of the superficial and profunda femoral arteries are patent without acute thrombus, aneurysm, or dissection.No hemodynamically significant stenosis. Veins: The SMV, portal, and hepatic veins are patent without thrombus. The suprarenal IVC is also widely patent. Review of the MIP images confirms the above findings. NON-VASCULAR Lower chest: No focal airspace consolidation or pleural effusion.Fibrolinear scarring and reticulation again noted in both lung bases, unchanged. Minimal bronchiolectasis also present, unchanged. Hepatobiliary: No mass.Cholecystectomy.Mild dilation of the intrahepatic and extrahepatic bile ducts, likely related to the prior cholecystectomy. Pancreas: Mild parenchymal atrophy of the pancreatic parenchyma. No mass or ductal dilation.No peripancreatic inflammation or fluid collection. Spleen: Normal size. No mass. Adrenals/Urinary Tract: No adrenal masses. No renal mass. No hydronephrosis or nephrolithiasis. The urinary bladder is distended without focal abnormality. Stomach/Bowel: Hyperdense ingested material within the stomach. No small bowel wall thickening or inflammation. No small bowel obstruction.The appendix was not visualized. No right lower quadrant or pericecal inflammatory changes to suggest acute appendicitis. Moderate to severe wall thickening throughout the descending and sigmoid colon with fairly diffuse pericolonic inflammation. Scattered colonic diverticulosis also present. GI Bleed: No extravasation of contrast to suggest active GI bleeding. Lymphatic: No intraabdominal or pelvic lymphadenopathy. Reproductive: Age-related atrophy of the uterus and ovaries. No concerning adnexal mass.No free pelvic fluid. Other: No pneumoperitoneum or ascites. Musculoskeletal: No acute fracture or destructive lesion.Remote, left-sided rib fractures. Multilevel degenerative disc disease of the spine.  Thoracic DISH. IMPRESSION: VASCULAR No aortic aneurysm, intramural hematoma, or aortic dissection. NON-VASCULAR Moderate to severe wall thickening throughout the descending and sigmoid colon with diffuse pericolonic inflammation, consistent with an infectious or inflammatory colitis. No active GI bleeding. Aortic Atherosclerosis (ICD10-I70.0). Electronically Signed   By: Rogelia Myers M.D.   On: 02/07/2024 12:32   DG Chest Port 1 View Result Date: 02/07/2024 EXAM: 1 VIEW(S) XRAY OF THE CHEST 02/07/2024 08:50:00 AM COMPARISON: 01/23/2024 CLINICAL HISTORY: GI bleed FINDINGS: LUNGS AND PLEURA: Low lung volumes. Left basilar linear atelectasis/scarring. No pleural effusion. No pneumothorax. HEART AND MEDIASTINUM: Aortic calcification. No acute abnormality of the cardiac and mediastinal silhouettes. BONES AND SOFT TISSUES: No acute osseous abnormality. IMPRESSION: 1. No acute cardiopulmonary abnormality. 2. Low lung volumes with left basilar linear atelectasis/scarring. Electronically signed by: Norleen Boxer MD 02/07/2024 09:25 AM EST RP Workstation: [id number]        Scheduled Meds:  buPROPion   300 mg Oral Daily   cyclobenzaprine   10 mg Oral QHS   diltiazem   180 mg Oral Daily   gabapentin   300 mg Oral TID   insulin  aspart  0-6 Units Subcutaneous TID WC   insulin  glargine  35 Units Subcutaneous BID  lisinopril   10 mg Oral Daily   pantoprazole   40 mg Oral Daily   PARoxetine   40 mg Oral Daily   rosuvastatin   10 mg Oral Daily   sodium chloride  flush  3 mL Intravenous Q12H   Continuous Infusions:  sodium chloride  Stopped (02/08/24 0550)   piperacillin -tazobactam 3.375 g (02/08/24 0550)     LOS: 1 day    Time spent: 45 minutes spent on chart review, discussion with nursing staff, consultants, updating family and interview/physical exam; more than 50% of that time was spent in counseling and/or coordination of care.    Angela RAYMOND Bowl, DO [facility] Available via Epic secure chat  7am-7pm After these hours, please refer to coverage provider listed on [email or web address] 02/08/2024, 12:18 PM   "

## 2024-02-09 DIAGNOSIS — K625 Hemorrhage of anus and rectum: Secondary | ICD-10-CM | POA: Diagnosis not present

## 2024-02-09 DIAGNOSIS — K529 Noninfective gastroenteritis and colitis, unspecified: Secondary | ICD-10-CM | POA: Diagnosis not present

## 2024-02-09 LAB — GASTROINTESTINAL PANEL BY PCR, STOOL (REPLACES STOOL CULTURE)

## 2024-02-09 LAB — CBC
HCT: 33.6 % — ABNORMAL LOW (ref 36.0–46.0)
Hemoglobin: 10.8 g/dL — ABNORMAL LOW (ref 12.0–15.0)
MCH: 25.8 pg — ABNORMAL LOW (ref 26.0–34.0)
MCHC: 32.1 g/dL (ref 30.0–36.0)
MCV: 80.4 fL (ref 80.0–100.0)
Platelets: 319 K/uL (ref 150–400)
RBC: 4.18 MIL/uL (ref 3.87–5.11)
RDW: 14 % (ref 11.5–15.5)
WBC: 11.1 K/uL — ABNORMAL HIGH (ref 4.0–10.5)
nRBC: 0 % (ref 0.0–0.2)

## 2024-02-09 LAB — BASIC METABOLIC PANEL WITH GFR
Anion gap: 9 (ref 5–15)
BUN: 5 mg/dL — ABNORMAL LOW (ref 8–23)
CO2: 27 mmol/L (ref 22–32)
Calcium: 8.7 mg/dL — ABNORMAL LOW (ref 8.9–10.3)
Chloride: 103 mmol/L (ref 98–111)
Creatinine, Ser: 0.57 mg/dL (ref 0.44–1.00)
GFR, Estimated: >60 mL/min
Glucose, Bld: 95 mg/dL (ref 70–99)
Potassium: 3.4 mmol/L — ABNORMAL LOW (ref 3.5–5.1)
Sodium: 139 mmol/L (ref 135–145)

## 2024-02-09 LAB — GLUCOSE, CAPILLARY
Glucose-Capillary: 89 mg/dL (ref 70–99)
Glucose-Capillary: 94 mg/dL (ref 70–99)
Glucose-Capillary: 267 mg/dL — ABNORMAL HIGH (ref 70–99)
Glucose-Capillary: 208 mg/dL — ABNORMAL HIGH (ref 70–99)
Glucose-Capillary: 208 mg/dL — ABNORMAL HIGH (ref 70–99)

## 2024-02-09 MED ORDER — SIMETHICONE 80 MG PO CHEW
80.0000 mg | CHEWABLE_TABLET | Freq: Four times a day (QID) | ORAL | Status: DC | PRN
  Administered 2024-02-09 – 2024-02-14 (×3): 80 mg via ORAL
  Filled 2024-02-09 (×3): qty 1

## 2024-02-09 MED ORDER — POTASSIUM CHLORIDE 10 MEQ/100ML IV SOLN
10.0000 meq | INTRAVENOUS | Status: AC
  Administered 2024-02-09 (×4): 10 meq via INTRAVENOUS
  Filled 2024-02-09 (×4): qty 100

## 2024-02-09 NOTE — Plan of Care (Signed)

## 2024-02-09 NOTE — Progress Notes (Signed)
 " PROGRESS NOTE    Angela Burch  FMW:[id number] DOB: [birth year] DOA: 02/07/2024 PCP: Dulin, Michael, MD    Brief Narrative:   Angela Burch is a 63 y.o. female with medical history significant of hypertension, hyperlipidemia, atrial flutter, diabetes,  bronchiectasis, lupus, and anxiety presents with abdominal pain and rectal bleeding.   Patient had just recently been hospitalized from 12/4-12/7, after being found down noted to be acute renal failure secondary to rhabdomyolysis which improved with IV fluid rehydration.  There was concern for the possibility of polypharmacy as the cause which patient was to follow-up with her primary in regards to adjustment in medication regimen.  The patient has been admitted to a med-surg bed. CTA of abdomen and pelvis had demonstrated moderate to severe wall thickening throughout the descending and sigmoid colon with diffuse pericolonic inflammation.  Lactic acid was reassuring at 0.8.  She was admitted to a med-surg bed. GI Panel is pending. Fecal occult blood is positive. She is receiving Zosyn . She is on enteric precautions until C Diff results are back. She is feeling a little better today, but continues to have BRBPR.   Assessment and Plan: SIRS Colitis with rectal bleeding Acute.  Patient presented with complaints of abdominal pain with rectal bleeding.  Stool guaiacs were positive but hemoglobin was noted to be 12.7.  Patient was noted to be tachycardic and tachypneic meeting SIRS criteria.  CT angiogram of the abdomen pelvis noted moderate to severe wall thickening throughout the descending and sigmoid colon with diffuse pericolonic inflammation.  Lactic acid was reassuring at 0.8. - GI panel is negative. - ESR is 26 and CRP is 10.4.  - Continue empiric antibiotics of Zosyn  -Hemoglobin has decreased from 11.5 to 10.8.  - Oxycodone  as needed for mild to moderate pain respectively - Antiemetics as needed -  Patient advised of need of  colonoscopy in 4-6 weeks   Uncontrolled diabetes mellitus type 2 with hyperglycemia, with long-term use of insulin  Admission glucose noted to be elevated at 358.  Last available hemoglobin A1c noted to be 11.4 when checked 01/24/2024. - Hypoglycemia protocols - Continue Tresiba  35 units twice daily - CBG q AC with very sensitive SSI - Adjust insulin  regimen as deemed medically appropriate    Hyponatremia Hypochloremia Resolved.  Sodium 131 and chloride 94 upon admission.  Improved to 139 and 103 this morning.  - Continue normal saline IV fluids at 75 mL/h  Lupus  Patient notes recent diagnosis of has a follow-up appointment with the rheumatologist sometime next year. - Continue Gabapentin  - Held Celebrex in the setting of acute bleeding   Essential hypertension - Continue Cardizem  and lisinopril    Anxiety - Wellbutrin , Paxil , and clonazepam  as needed   GERD  - Protonix    I have seen and examined this patient myself. I have spent 42 minutes in her evaluation and care.  DVT prophylaxis: SCDs Start: 02/07/24 1506    Code Status: Do not attempt resuscitation (DNR) PRE-ARREST INTERVENTIONS DESIRED Family Communication:   Disposition Plan:  Level of care: Med-Surg Status is: Inpatient   Consultants:  None  Subjective: No further bleeding overnight Stomach sore  Objective: Vitals:   02/08/24 2143 02/09/24 0439 02/09/24 0812 02/09/24 0820  BP: 119/63 (!) 128/56 104/74 104/74  Pulse: 79 66 83 83  Resp: 17 15    Temp: 98.3 F (36.8 C) 97.9 F (36.6 C) 98.3 F (36.8 C)   TempSrc: Oral Oral Oral   SpO2: 95% 93% 98% 98%  Intake/Output Summary (Last 24 hours) at 02/09/2024 1432 Last data filed at 02/08/2024 1700 Gross per 24 hour  Intake 947 ml  Output --  Net 947 ml   There were no vitals filed for this visit.  Examination:   Exam:  Constitutional:  The patient is awake, alert, and oriented x 3. No acute distress. Respiratory:  No increased work of  breathing. No wheezes, rales, or rhonchi No tactile fremitus Cardiovascular:  Regular rate and rhythm No murmurs, ectopy, or gallups. No lateral PMI. No thrills. Abdomen:  Abdomen is soft, mildly tender, somewhat distended No hernias, masses, or organomegaly Normoactive bowel sounds.  Musculoskeletal:  No cyanosis, clubbing, or edema Skin:  No rashes, lesions, ulcers palpation of skin: no induration or nodules Neurologic:  CN 2-12 intact Sensation all 4 extremities intact Psychiatric:  Mental status Mood, affect appropriate Orientation to person, place, time  judgment and insight appear intact   Data Reviewed: I have personally reviewed following labs and imaging studies  CBC: Recent Labs  Lab 02/07/24 0907 02/07/24 2022 02/08/24 0658 02/09/24 0558  WBC 19.8*  --  15.3* 11.1*  NEUTROABS 17.7*  --   --   --   HGB 12.7 11.2* 11.5* 10.8*  HCT 39.4 33.9* 35.1* 33.6*  MCV 81.1  --  80.3 80.4  PLT 416*  --  358 319   Basic Metabolic Panel: Recent Labs  Lab 02/07/24 0907 02/08/24 0658 02/09/24 0558  NA 131* 136 139  K 4.9 3.8 3.4*  CL 94* 102 103  CO2 24 25 27   GLUCOSE 358* 175* 95  BUN 21 10 5*  CREATININE 0.93 0.70 0.57  CALCIUM  10.2 8.8* 8.7*   GFR: CrCl cannot be calculated (Unknown ideal weight.). Liver Function Tests: Recent Labs  Lab 02/07/24 0907  AST 26  ALT 35  ALKPHOS 150*  BILITOT 0.5  PROT 7.9  ALBUMIN 4.8   No results for input(s): LIPASE, AMYLASE in the last 168 hours. No results for input(s): AMMONIA in the last 168 hours. Coagulation Profile: Recent Labs  Lab 02/07/24 0907  INR 0.9   Cardiac Enzymes: No results for input(s): CKTOTAL, CKMB, CKMBINDEX, TROPONINI in the last 168 hours. BNP (last 3 results) No results for input(s): PROBNP in the last 8760 hours. HbA1C: No results for input(s): HGBA1C in the last 72 hours. CBG: Recent Labs  Lab 02/08/24 1619 02/08/24 2143 02/09/24 0652 02/09/24 0804  02/09/24 1201  GLUCAP 217* 192* 89 94 267*   Lipid Profile: No results for input(s): CHOL, HDL, LDLCALC, TRIG, CHOLHDL, LDLDIRECT in the last 72 hours. Thyroid Function Tests: No results for input(s): TSH, T4TOTAL, FREET4, T3FREE, THYROIDAB in the last 72 hours. Anemia Panel: No results for input(s): VITAMINB12, FOLATE, FERRITIN, TIBC, IRON, RETICCTPCT in the last 72 hours. Sepsis Labs: Recent Labs  Lab 02/07/24 1325  LATICACIDVEN 0.8    Recent Results (from the past 240 hours)  Culture, blood (routine x 2)     Status: None (Preliminary result)   Collection Time: 02/07/24  1:15 PM   Specimen: BLOOD RIGHT FOREARM  Result Value Ref Range Status   Specimen Description BLOOD RIGHT FOREARM  Final   Special Requests   Final    BOTTLES DRAWN AEROBIC AND ANAEROBIC Blood Culture results may not be optimal due to an inadequate volume of blood received in culture bottles   Culture   Final    NO GROWTH 2 DAYS Performed at [facility], [address] [place], [place] [address]  Report Status PENDING  Incomplete  Culture, blood (routine x 2)     Status: None (Preliminary result)   Collection Time: 02/07/24  8:22 PM   Specimen: BLOOD  Result Value Ref Range Status   Specimen Description BLOOD RIGHT ANTECUBITAL  Final   Special Requests   Final    BOTTLES DRAWN AEROBIC AND ANAEROBIC Blood Culture adequate volume   Culture   Final    NO GROWTH 2 DAYS Performed at [facility], [address] [place], [place] [address]    Report Status PENDING  Incomplete    Radiology Studies: No results found.  Scheduled Meds:  buPROPion   300 mg Oral Daily   cyclobenzaprine   10 mg Oral QHS   diltiazem   180 mg Oral Daily   gabapentin   300 mg Oral TID   insulin  aspart  0-6 Units Subcutaneous TID WC   insulin  glargine  35 Units Subcutaneous BID   lisinopril   10 mg Oral Daily   pantoprazole   40 mg Oral Daily   PARoxetine   40 mg Oral Daily    rosuvastatin   10 mg Oral Daily   sodium chloride  flush  3 mL Intravenous Q12H   Continuous Infusions:  piperacillin -tazobactam 3.375 g (02/09/24 1401)   potassium chloride  10 mEq (02/09/24 1359)     LOS: 2 days    Time spent: 38 minutes spent on chart review, discussion with nursing staff, consultants, updating family and interview/physical exam; more than 50% of that time was spent in counseling and/or coordination of care.    Zaccheaus Storlie, DO [facility] Available via Epic secure chat 7am-7pm After these hours, please refer to coverage provider listed on [email or web address] 02/09/2024, 2:32 PM   "

## 2024-02-10 DIAGNOSIS — K625 Hemorrhage of anus and rectum: Secondary | ICD-10-CM | POA: Diagnosis not present

## 2024-02-10 DIAGNOSIS — K529 Noninfective gastroenteritis and colitis, unspecified: Secondary | ICD-10-CM | POA: Diagnosis not present

## 2024-02-10 LAB — CBC WITH DIFFERENTIAL/PLATELET
Abs Immature Granulocytes: 0.04 K/uL (ref 0.00–0.07)
Basophils Absolute: 0.1 K/uL (ref 0.0–0.1)
Basophils Relative: 1 %
Eosinophils Absolute: 0.6 K/uL — ABNORMAL HIGH (ref 0.0–0.5)
Eosinophils Relative: 5 %
HCT: 32.4 % — ABNORMAL LOW (ref 36.0–46.0)
Hemoglobin: 10.5 g/dL — ABNORMAL LOW (ref 12.0–15.0)
Immature Granulocytes: 0 %
Lymphocytes Relative: 21 %
Lymphs Abs: 2.4 K/uL (ref 0.7–4.0)
MCH: 26 pg (ref 26.0–34.0)
MCHC: 32.4 g/dL (ref 30.0–36.0)
MCV: 80.2 fL (ref 80.0–100.0)
Monocytes Absolute: 1.1 K/uL — ABNORMAL HIGH (ref 0.1–1.0)
Monocytes Relative: 10 %
Neutro Abs: 7.1 K/uL (ref 1.7–7.7)
Neutrophils Relative %: 63 %
Platelets: 358 K/uL (ref 150–400)
RBC: 4.04 MIL/uL (ref 3.87–5.11)
RDW: 14 % (ref 11.5–15.5)
WBC: 11.3 K/uL — ABNORMAL HIGH (ref 4.0–10.5)
nRBC: 0 % (ref 0.0–0.2)

## 2024-02-10 LAB — BASIC METABOLIC PANEL WITH GFR
Anion gap: 9 (ref 5–15)
BUN: <5 mg/dL — ABNORMAL LOW (ref 8–23)
CO2: 30 mmol/L (ref 22–32)
Calcium: 8.9 mg/dL (ref 8.9–10.3)
Chloride: 103 mmol/L (ref 98–111)
Creatinine, Ser: 0.65 mg/dL (ref 0.44–1.00)
GFR, Estimated: >60 mL/min
Glucose, Bld: 62 mg/dL — ABNORMAL LOW (ref 70–99)
Potassium: 3.1 mmol/L — ABNORMAL LOW (ref 3.5–5.1)
Sodium: 141 mmol/L (ref 135–145)

## 2024-02-10 LAB — GLUCOSE, CAPILLARY
Glucose-Capillary: 65 mg/dL — ABNORMAL LOW (ref 70–99)
Glucose-Capillary: 109 mg/dL — ABNORMAL HIGH (ref 70–99)
Glucose-Capillary: 261 mg/dL — ABNORMAL HIGH (ref 70–99)
Glucose-Capillary: 198 mg/dL — ABNORMAL HIGH (ref 70–99)
Glucose-Capillary: 250 mg/dL — ABNORMAL HIGH (ref 70–99)

## 2024-02-10 MED ORDER — INSULIN GLARGINE 100 UNIT/ML ~~LOC~~ SOLN
30.0000 [IU] | Freq: Two times a day (BID) | SUBCUTANEOUS | Status: DC
  Administered 2024-02-10 – 2024-02-11 (×2): 30 [IU] via SUBCUTANEOUS
  Filled 2024-02-10 (×3): qty 0.3

## 2024-02-10 MED ORDER — POLYETHYLENE GLYCOL 3350 17 G PO PACK
17.0000 g | PACK | Freq: Two times a day (BID) | ORAL | Status: DC
  Administered 2024-02-10 – 2024-02-13 (×5): 17 g via ORAL
  Filled 2024-02-10 (×9): qty 1

## 2024-02-10 MED ORDER — BISACODYL 10 MG RE SUPP
10.0000 mg | Freq: Once | RECTAL | Status: DC
  Filled 2024-02-10: qty 1

## 2024-02-10 MED ORDER — POTASSIUM CHLORIDE 10 MEQ/100ML IV SOLN
10.0000 meq | INTRAVENOUS | Status: AC
  Administered 2024-02-10 (×4): 10 meq via INTRAVENOUS
  Filled 2024-02-10 (×2): qty 100

## 2024-02-10 NOTE — Progress Notes (Signed)
 " PROGRESS NOTE    Angela Burch  FMW:[id number] DOB: 1960-07-02 DOA: 02/07/2024 PCP: Dulin, Michael, MD    Brief Narrative:   Angela Burch is a 63 y.o. female with medical history significant of hypertension, hyperlipidemia, atrial flutter, diabetes,  bronchiectasis, lupus, and anxiety presents with abdominal pain and rectal bleeding.   Patient had just recently been hospitalized from 12/4-12/7, after being found down noted to be acute renal failure secondary to rhabdomyolysis which improved with IV fluid rehydration.  There was concern for the possibility of polypharmacy as the cause which patient was to follow-up with her primary in regards to adjustment in medication regimen.  The patient has been admitted to a med-surg bed. CTA of abdomen and pelvis had demonstrated moderate to severe wall thickening throughout the descending and sigmoid colon with diffuse pericolonic inflammation.  Lactic acid was reassuring at 0.8.  She was admitted to a med-surg bed. GI Panel is negative. Fecal occult blood is positive. She is receiving Zosyn . She is on enteric precautions until C Diff results are back. She is feeling a little better today, but continues to have BRBPR.   Hemoglobin is stable on 02/10/2024 at 10.5. Potassium was low at 3.1 and was supplemented. The patient had hypoglycemia with a glucose of 62. Her lantus  has been decreased to 30 units.   The patient was complaining of a new cough in the last 24 hours as well as a sore throat. Will test for RSV-Flu a & b, and Covid.  Assessment and Plan: SIRS Colitis with rectal bleeding Acute.  Patient presented with complaints of abdominal pain with rectal bleeding.  Stool guaiacs were positive but hemoglobin was noted to be 12.7.  Patient was noted to be tachycardic and tachypneic meeting SIRS criteria.  CT angiogram of the abdomen pelvis noted moderate to severe wall thickening throughout the descending and sigmoid colon with diffuse  pericolonic inflammation.  Lactic acid was reassuring at 0.8. - GI panel is negative. - ESR is 26 and CRP is 10.4.  - Continue empiric antibiotics of Zosyn  -Hemoglobin has decreased from 11.5 to 10.8.  - Oxycodone  as needed for mild to moderate pain respectively - Antiemetics as needed -  Patient advised of need of colonoscopy in 4-6 weeks   Uncontrolled diabetes mellitus type 2 with hyperglycemia, with long-term use of insulin  Admission glucose noted to be elevated at 358.  Last available hemoglobin A1c noted to be 11.4 when checked 01/24/2024. - Hypoglycemia overnight with glucose of 62. Will decrease lantus  to 32 units daily.  - Continue Tresiba  35 units twice daily - CBG q AC with very sensitive SSI - Adjust insulin  regimen as deemed medically appropriate    Hyponatremia Hypochloremia Resolved.  Sodium 131 and chloride 94 upon admission.  Improved to 141 and 103 on the morning of 02/10/2024  - IV fluids discontinued.   Lupus  Patient notes recent diagnosis of has a follow-up appointment with the rheumatologist sometime next year. - Continue Gabapentin  - Held Celebrex in the setting of acute bleeding   Essential hypertension - Continue Cardizem  and lisinopril    Anxiety - Wellbutrin , Paxil , and clonazepam  as needed   GERD  - Protonix    I have seen and examined this patient myself. I have spent 42 minutes in her evaluation and care.  DVT prophylaxis: SCDs Start: 02/07/24 1506    Code Status: Do not attempt resuscitation (DNR) PRE-ARREST INTERVENTIONS DESIRED Family Communication:   Disposition Plan:  Level of care: Med-Surg Status is: Inpatient  Consultants:  None  Subjective: No further bleeding overnight Stomach sore  Objective: Vitals:   02/09/24 2000 02/10/24 0400 02/10/24 0802 02/10/24 1349  BP: 129/60 126/60 (!) 149/64 137/60  Pulse: 72 65 71 76  Resp: 16 16 17    Temp: 98.6 F (37 C) 98.4 F (36.9 C) 97.7 F (36.5 C) 98.8 F (37.1 C)  TempSrc:  Oral Oral Oral Oral  SpO2: 99% 95% 100% 98%    Intake/Output Summary (Last 24 hours) at 02/10/2024 1408 Last data filed at 02/09/2024 1638 Gross per 24 hour  Intake 520.15 ml  Output --  Net 520.15 ml   There were no vitals filed for this visit.  Examination:   Exam:  Constitutional:  The patient is awake, alert, and oriented x 3. No acute distress. Respiratory:  No increased work of breathing. No wheezes, rales, or rhonchi No tactile fremitus Cardiovascular:  Regular rate and rhythm No murmurs, ectopy, or gallups. No lateral PMI. No thrills. Abdomen:  Abdomen is soft, mildly tender, somewhat distended No hernias, masses, or organomegaly Normoactive bowel sounds.  Musculoskeletal:  No cyanosis, clubbing, or edema Skin:  No rashes, lesions, ulcers palpation of skin: no induration or nodules Neurologic:  CN 2-12 intact Sensation all 4 extremities intact Psychiatric:  Mental status Mood, affect appropriate Orientation to person, place, time  judgment and insight appear intact   Data Reviewed: I have personally reviewed following labs and imaging studies      Latest Ref Rng & Units 02/10/2024    3:47 AM 02/09/2024    5:58 AM 02/08/2024    6:58 AM  CBC  WBC 4.0 - 10.5 K/uL 11.3  11.1  15.3   Hemoglobin 12.0 - 15.0 g/dL 89.4  89.1  88.4   Hematocrit 36.0 - 46.0 % 32.4  33.6  35.1   Platelets 150 - 400 K/uL 358  319  358        Latest Ref Rng & Units 02/10/2024    3:47 AM 02/09/2024    5:58 AM 02/08/2024    6:58 AM  BMP  Glucose 70 - 99 mg/dL 62  95  824   BUN 8 - 23 mg/dL 5  5  10    Creatinine 0.44 - 1.00 mg/dL 9.34  9.42  9.29   Sodium 135 - 145 mmol/L 141  139  136   Potassium 3.5 - 5.1 mmol/L 3.1  3.4  3.8   Chloride 98 - 111 mmol/L 103  103  102   CO2 22 - 32 mmol/L 30  27  25    Calcium  8.9 - 10.3 mg/dL 8.9  8.7  8.8    CBG (last 3)  Recent Labs    02/10/24 0618 02/10/24 0644 02/10/24 1115  GLUCAP 65* 109* 261*     Lipid  Profile: No results for input(s): CHOL, HDL, LDLCALC, TRIG, CHOLHDL, LDLDIRECT in the last 72 hours. Thyroid Function Tests: No results for input(s): TSH, T4TOTAL, FREET4, T3FREE, THYROIDAB in the last 72 hours. Anemia Panel: No results for input(s): VITAMINB12, FOLATE, FERRITIN, TIBC, IRON, RETICCTPCT in the last 72 hours. Sepsis Labs: Recent Labs  Lab 02/07/24 1325  LATICACIDVEN 0.8    Recent Results (from the past 240 hours)  Culture, blood (routine x 2)     Status: None (Preliminary result)   Collection Time: 02/07/24  1:15 PM   Specimen: BLOOD RIGHT FOREARM  Result Value Ref Range Status   Specimen Description BLOOD RIGHT FOREARM  Final   Special Requests   Final  BOTTLES DRAWN AEROBIC AND ANAEROBIC Blood Culture results may not be optimal due to an inadequate volume of blood received in culture bottles   Culture   Final    NO GROWTH 3 DAYS Performed at [facility], [address], [place], [place] [address]    Report Status PENDING  Incomplete  Culture, blood (routine x 2)     Status: None (Preliminary result)   Collection Time: 02/07/24  8:22 PM   Specimen: BLOOD  Result Value Ref Range Status   Specimen Description BLOOD RIGHT ANTECUBITAL  Final   Special Requests   Final    BOTTLES DRAWN AEROBIC AND ANAEROBIC Blood Culture adequate volume   Culture   Final    NO GROWTH 3 DAYS Performed at [facility], [address], [place], [place] [address]    Report Status PENDING  Incomplete  Gastrointestinal Panel by PCR , Stool     Status: None   Collection Time: 02/08/24  4:16 PM   Specimen: Stool  Result Value Ref Range Status   Campylobacter species NOT DETECTED NOT DETECTED Final   Plesimonas shigelloides NOT DETECTED NOT DETECTED Final   Salmonella species NOT DETECTED NOT DETECTED Final   Yersinia enterocolitica NOT DETECTED NOT DETECTED Final   Vibrio species NOT DETECTED NOT DETECTED Final   Vibrio cholerae NOT  DETECTED NOT DETECTED Final   Enteroaggregative E coli (EAEC) NOT DETECTED NOT DETECTED Final   Enteropathogenic E coli (EPEC) NOT DETECTED NOT DETECTED Final   Enterotoxigenic E coli (ETEC) NOT DETECTED NOT DETECTED Final   Shiga like toxin producing E coli (STEC) NOT DETECTED NOT DETECTED Final   Shigella/Enteroinvasive E coli (EIEC) NOT DETECTED NOT DETECTED Final   Cryptosporidium NOT DETECTED NOT DETECTED Final   Cyclospora cayetanensis NOT DETECTED NOT DETECTED Final   Entamoeba histolytica NOT DETECTED NOT DETECTED Final   Giardia lamblia NOT DETECTED NOT DETECTED Final   Adenovirus F40/41 NOT DETECTED NOT DETECTED Final   Astrovirus NOT DETECTED NOT DETECTED Final   Norovirus GI/GII NOT DETECTED NOT DETECTED Final   Rotavirus A NOT DETECTED NOT DETECTED Final   Sapovirus (I, II, IV, and V) NOT DETECTED NOT DETECTED Final    Comment: Performed at [facility], [address] [place], [place] [address]    Radiology Studies: No results found.  Scheduled Meds:  bisacodyl   10 mg Rectal Once   buPROPion   300 mg Oral Daily   cyclobenzaprine   10 mg Oral QHS   diltiazem   180 mg Oral Daily   gabapentin   300 mg Oral TID   insulin  aspart  0-6 Units Subcutaneous TID WC   insulin  glargine  30 Units Subcutaneous BID   lisinopril   10 mg Oral Daily   pantoprazole   40 mg Oral Daily   PARoxetine   40 mg Oral Daily   polyethylene glycol  17 g Oral BID   rosuvastatin   10 mg Oral Daily   sodium chloride  flush  3 mL Intravenous Q12H   Continuous Infusions:  piperacillin -tazobactam 3.375 g (02/10/24 0558)   potassium chloride  10 mEq (02/10/24 1330)     LOS: 3 days    Time spent: 38 minutes spent on chart review, discussion with nursing staff, consultants, updating family and interview/physical exam; more than 50% of that time was spent in counseling and/or coordination of care.    Marae Cottrell, DO [facility] Available via Epic secure chat 7am-7pm After these hours,  please refer to coverage provider listed on [email or web address]  02/10/2024, 2:08 PM   "

## 2024-02-10 NOTE — Care Management Important Message (Signed)
 Important Message  Patient Details  Name: Angela Burch MRN: [id number] Date of Birth: February 17, 1961   Important Message Given:  Yes - Medicare IM     Jennie Laneta Dragon 02/10/2024, 2:12 PM

## 2024-02-11 DIAGNOSIS — K529 Noninfective gastroenteritis and colitis, unspecified: Secondary | ICD-10-CM | POA: Diagnosis not present

## 2024-02-11 DIAGNOSIS — R748 Abnormal levels of other serum enzymes: Secondary | ICD-10-CM | POA: Diagnosis not present

## 2024-02-11 DIAGNOSIS — K625 Hemorrhage of anus and rectum: Secondary | ICD-10-CM | POA: Diagnosis not present

## 2024-02-11 DIAGNOSIS — D649 Anemia, unspecified: Secondary | ICD-10-CM | POA: Diagnosis not present

## 2024-02-11 LAB — CBC WITH DIFFERENTIAL/PLATELET
Abs Immature Granulocytes: 0.05 K/uL (ref 0.00–0.07)
Basophils Absolute: 0.1 K/uL (ref 0.0–0.1)
Basophils Relative: 1 %
Eosinophils Absolute: 0.7 K/uL — ABNORMAL HIGH (ref 0.0–0.5)
Eosinophils Relative: 8 %
HCT: 31.1 % — ABNORMAL LOW (ref 36.0–46.0)
Hemoglobin: 10 g/dL — ABNORMAL LOW (ref 12.0–15.0)
Immature Granulocytes: 1 %
Lymphocytes Relative: 30 %
Lymphs Abs: 2.8 K/uL (ref 0.7–4.0)
MCH: 26.1 pg (ref 26.0–34.0)
MCHC: 32.2 g/dL (ref 30.0–36.0)
MCV: 81.2 fL (ref 80.0–100.0)
Monocytes Absolute: 0.8 K/uL (ref 0.1–1.0)
Monocytes Relative: 9 %
Neutro Abs: 4.9 K/uL (ref 1.7–7.7)
Neutrophils Relative %: 51 %
Platelets: 376 K/uL (ref 150–400)
RBC: 3.83 MIL/uL — ABNORMAL LOW (ref 3.87–5.11)
RDW: 14.1 % (ref 11.5–15.5)
WBC: 9.4 K/uL (ref 4.0–10.5)
nRBC: 0 % (ref 0.0–0.2)

## 2024-02-11 LAB — BASIC METABOLIC PANEL WITH GFR
Anion gap: 11 (ref 5–15)
BUN: 8 mg/dL (ref 8–23)
CO2: 27 mmol/L (ref 22–32)
Calcium: 9.2 mg/dL (ref 8.9–10.3)
Chloride: 101 mmol/L (ref 98–111)
Creatinine, Ser: 0.7 mg/dL (ref 0.44–1.00)
GFR, Estimated: >60 mL/min
Glucose, Bld: 54 mg/dL — ABNORMAL LOW (ref 70–99)
Potassium: 3.7 mmol/L (ref 3.5–5.1)
Sodium: 138 mmol/L (ref 135–145)

## 2024-02-11 LAB — GLUCOSE, CAPILLARY
Glucose-Capillary: 51 mg/dL — ABNORMAL LOW (ref 70–99)
Glucose-Capillary: 100 mg/dL — ABNORMAL HIGH (ref 70–99)
Glucose-Capillary: 64 mg/dL — ABNORMAL LOW (ref 70–99)
Glucose-Capillary: 195 mg/dL — ABNORMAL HIGH (ref 70–99)
Glucose-Capillary: 259 mg/dL — ABNORMAL HIGH (ref 70–99)

## 2024-02-11 MED ORDER — HYDROCORTISONE 1 % EX CREA
TOPICAL_CREAM | Freq: Three times a day (TID) | CUTANEOUS | Status: AC | PRN
  Filled 2024-02-11: qty 28

## 2024-02-11 MED ORDER — INSULIN ASPART 100 UNIT/ML IJ SOLN
0.0000 [IU] | Freq: Three times a day (TID) | INTRAMUSCULAR | Status: DC
  Administered 2024-02-11: 1 [IU] via SUBCUTANEOUS
  Administered 2024-02-11: 3 [IU] via SUBCUTANEOUS
  Administered 2024-02-12: 1 [IU] via SUBCUTANEOUS
  Administered 2024-02-12: 2 [IU] via SUBCUTANEOUS
  Administered 2024-02-13 (×2): 1 [IU] via SUBCUTANEOUS
  Administered 2024-02-13: 3 [IU] via SUBCUTANEOUS
  Filled 2024-02-11 (×2): qty 1
  Filled 2024-02-11: qty 3
  Filled 2024-02-11: qty 1
  Filled 2024-02-11: qty 3
  Filled 2024-02-11: qty 2
  Filled 2024-02-11: qty 1

## 2024-02-11 MED ORDER — INSULIN GLARGINE 100 UNIT/ML ~~LOC~~ SOLN
20.0000 [IU] | Freq: Two times a day (BID) | SUBCUTANEOUS | Status: DC
  Administered 2024-02-11 – 2024-02-15 (×8): 20 [IU] via SUBCUTANEOUS
  Filled 2024-02-11 (×9): qty 0.2

## 2024-02-11 NOTE — Progress Notes (Signed)
 " PROGRESS NOTE    Angela Burch  FMW:[id number] DOB: Jun 15, 1960 DOA: 02/07/2024 PCP: Dulin, Michael, MD   Brief Narrative:  This 63 y.o. female with medical history significant for hypertension, hyperlipidemia, atrial flutter, diabetes,  bronchiectasis, lupus, and anxiety presents with abdominal pain and rectal bleeding. CTA of abdomen and pelvis had demonstrated moderate to severe wall thickening throughout the descending and sigmoid colon with diffuse pericolonic inflammation. Lactic acid was reassuring at 0.8.  GI panel is negative, FOBT+, Patient was admitted for further evaluation and started on enteric precautions until C. difficile results are back.  She is started on IV Zosyn .  H&H remains stable.  Assessment & Plan:   Principal Problem:   Colitis with rectal bleeding Active Problems:   SIRS (systemic inflammatory response syndrome) (HCC)   Uncontrolled type 2 diabetes mellitus with hyperglycemia, with long-term current use of insulin  (HCC)   Hyponatremia   Hypochloremia   Lupus (systemic lupus erythematosus) (HCC)   Essential hypertension   Anxiety   GERD without esophagitis  Abdominal Pain; Colitis with rectal bleeding: Patient presented with abdominal pain and rectal bleeding.   Stool guaiacs were positive but hemoglobin was noted to be 12.7.   CT A/P noted moderate to severe wall thickening throughout the descending and sigmoid colon.  Lactic acid was reassuring at 0.8.  Sepsis ruled out. GI panel is negative. ESR is 26 and CRP is 10.4.  Continue empiric antibiotics IV Zosyn  Hemoglobin has decreased from 12.7 >11.2 >10.5 >10.0 Monitor H&H. Continue Oxycodone  as needed for mild to moderate pain respectively. Antiemetics as needed. Gastroenterology consulted.  Awaiting recommendation.   Patient will need colonoscopy in 4 to 6 weeks once acute event resolves.   Diabetes mellitus type 2 with hyperglycemia: Blood glucose elevated at 358 at admission. Last Hb A1c  noted to be 11.4 Hypoglycemia overnight with glucose of 62.  Lantus  decreased to 20 units daily CBG q AC with very sensitive SSI. Adjust insulin  regimen as deemed medically appropriate.   Hyponatremia / Hypochloremia: Improved with IV hydration.   Lupus: Patient notes recent diagnosis .She has follow-up appointment with the rheumatologist sometime next year. Continue Gabapentin  Held Celebrex in the setting of acute bleeding   Essential hypertension: Continue Cardizem  and lisinopril    Anxiety: Continue Wellbutrin , Paxil , and clonazepam  as needed.   GERD: Continue Protonix .  DVT prophylaxis: SCDs Code Status: DNR Family Communication: No family at bedside Disposition Plan:    Status is: Inpatient Remains inpatient appropriate because: Admitted for colitis with rectal bleeding.  H&H remains stable.  She continues to have rectal bleeding.  GI is consulted.  Patient is not medically ready for discharge.    Consultants:  Gastroenterology  Procedures: CTA/P  Antimicrobials:  Anti-infectives (From admission, onward)    Start     Dose/Rate Route Frequency Ordered Stop   02/07/24 2000  piperacillin -tazobactam (ZOSYN ) IVPB 3.375 g        3.375 g 12.5 mL/hr over 240 Minutes Intravenous Every 8 hours 02/07/24 1611     02/07/24 1300  piperacillin -tazobactam (ZOSYN ) IVPB 3.375 g        3.375 g 100 mL/hr over 30 Minutes Intravenous  Once 02/07/24 1254 02/07/24 1400      Subjective: Patient was seen and examined at bedside.  Overnight events noted. She still reports having pain in the abdomen,  still feels nauseous and lightheaded.  Objective: Vitals:   02/10/24 1349 02/10/24 1938 02/11/24 0303 02/11/24 0715  BP: 137/60 136/63 (!) 108/56 114/70  Pulse:  76 87 67 73  Resp:  18 17 17   Temp: 98.8 F (37.1 C) 98.4 F (36.9 C) (!) 97.5 F (36.4 C) 97.6 F (36.4 C)  TempSrc: Oral Oral Oral   SpO2: 98% 98% 96% 97%    Intake/Output Summary (Last 24 hours) at 02/11/2024  1134 Last data filed at 02/10/2024 1530 Gross per 24 hour  Intake 644.61 ml  Output --  Net 644.61 ml   There were no vitals filed for this visit.  Examination:  General exam: Appears calm and comfortable, not in any acute distress. Respiratory system: CTA Bilaterally. Respiratory effort normal.  RR 14 Cardiovascular system: S1 & S2 heard, RRR. No JVD, murmurs, rubs, gallops or clicks.  Gastrointestinal system: Abdomen is nondistended, soft and nontender.  Normal bowel sounds heard. Central nervous system: Alert and oriented x 3. No focal neurological deficits. Extremities: No edema, no cyanosis, no clubbing. Skin: No rashes, lesions or ulcers Psychiatry: Judgement and insight appear normal. Mood & affect appropriate.     Data Reviewed: I have personally reviewed following labs and imaging studies  CBC: Recent Labs  Lab 02/07/24 0907 02/07/24 2022 02/08/24 0658 02/09/24 0558 02/10/24 0347 02/11/24 0443  WBC 19.8*  --  15.3* 11.1* 11.3* 9.4  NEUTROABS 17.7*  --   --   --  7.1 4.9  HGB 12.7 11.2* 11.5* 10.8* 10.5* 10.0*  HCT 39.4 33.9* 35.1* 33.6* 32.4* 31.1*  MCV 81.1  --  80.3 80.4 80.2 81.2  PLT 416*  --  358 319 358 376   Basic Metabolic Panel: Recent Labs  Lab 02/07/24 0907 02/08/24 0658 02/09/24 0558 02/10/24 0347 02/11/24 0443  NA 131* 136 139 141 138  K 4.9 3.8 3.4* 3.1* 3.7  CL 94* 102 103 103 101  CO2 24 25 27 30 27   GLUCOSE 358* 175* 95 62* 54*  BUN 21 10 5* <5* 8  CREATININE 0.93 0.70 0.57 0.65 0.70  CALCIUM  10.2 8.8* 8.7* 8.9 9.2   GFR: CrCl cannot be calculated (Unknown ideal weight.). Liver Function Tests: Recent Labs  Lab 02/07/24 0907  AST 26  ALT 35  ALKPHOS 150*  BILITOT 0.5  PROT 7.9  ALBUMIN 4.8   No results for input(s): LIPASE, AMYLASE in the last 168 hours. No results for input(s): AMMONIA in the last 168 hours. Coagulation Profile: Recent Labs  Lab 02/07/24 0907  INR 0.9   Cardiac Enzymes: No results for  input(s): CKTOTAL, CKMB, CKMBINDEX, TROPONINI in the last 168 hours. BNP (last 3 results) No results for input(s): PROBNP in the last 8760 hours. HbA1C: No results for input(s): HGBA1C in the last 72 hours. CBG: Recent Labs  Lab 02/10/24 1618 02/10/24 1939 02/11/24 0622 02/11/24 0655 02/11/24 0717  GLUCAP 198* 250* 51* 64* 100*   Lipid Profile: No results for input(s): CHOL, HDL, LDLCALC, TRIG, CHOLHDL, LDLDIRECT in the last 72 hours. Thyroid Function Tests: No results for input(s): TSH, T4TOTAL, FREET4, T3FREE, THYROIDAB in the last 72 hours. Anemia Panel: No results for input(s): VITAMINB12, FOLATE, FERRITIN, TIBC, IRON, RETICCTPCT in the last 72 hours. Sepsis Labs: Recent Labs  Lab 02/07/24 1325  LATICACIDVEN 0.8    Recent Results (from the past 240 hours)  Culture, blood (routine x 2)     Status: None (Preliminary result)   Collection Time: 02/07/24  1:15 PM   Specimen: BLOOD RIGHT FOREARM  Result Value Ref Range Status   Specimen Description BLOOD RIGHT FOREARM  Final   Special Requests   Final  BOTTLES DRAWN AEROBIC AND ANAEROBIC Blood Culture results may not be optimal due to an inadequate volume of blood received in culture bottles   Culture   Final    NO GROWTH 4 DAYS Performed at [facility], [address], [place], [place] [address]    Report Status PENDING  Incomplete  Culture, blood (routine x 2)     Status: None (Preliminary result)   Collection Time: 02/07/24  8:22 PM   Specimen: BLOOD  Result Value Ref Range Status   Specimen Description BLOOD RIGHT ANTECUBITAL  Final   Special Requests   Final    BOTTLES DRAWN AEROBIC AND ANAEROBIC Blood Culture adequate volume   Culture   Final    NO GROWTH 4 DAYS Performed at [facility], [address], [place], [place] [address]    Report Status PENDING  Incomplete  Gastrointestinal Panel by PCR , Stool     Status: None   Collection Time: 02/08/24   4:16 PM   Specimen: Stool  Result Value Ref Range Status   Campylobacter species NOT DETECTED NOT DETECTED Final   Plesimonas shigelloides NOT DETECTED NOT DETECTED Final   Salmonella species NOT DETECTED NOT DETECTED Final   Yersinia enterocolitica NOT DETECTED NOT DETECTED Final   Vibrio species NOT DETECTED NOT DETECTED Final   Vibrio cholerae NOT DETECTED NOT DETECTED Final   Enteroaggregative E coli (EAEC) NOT DETECTED NOT DETECTED Final   Enteropathogenic E coli (EPEC) NOT DETECTED NOT DETECTED Final   Enterotoxigenic E coli (ETEC) NOT DETECTED NOT DETECTED Final   Shiga like toxin producing E coli (STEC) NOT DETECTED NOT DETECTED Final   Shigella/Enteroinvasive E coli (EIEC) NOT DETECTED NOT DETECTED Final   Cryptosporidium NOT DETECTED NOT DETECTED Final   Cyclospora cayetanensis NOT DETECTED NOT DETECTED Final   Entamoeba histolytica NOT DETECTED NOT DETECTED Final   Giardia lamblia NOT DETECTED NOT DETECTED Final   Adenovirus F40/41 NOT DETECTED NOT DETECTED Final   Astrovirus NOT DETECTED NOT DETECTED Final   Norovirus GI/GII NOT DETECTED NOT DETECTED Final   Rotavirus A NOT DETECTED NOT DETECTED Final   Sapovirus (I, II, IV, and V) NOT DETECTED NOT DETECTED Final    Comment: Performed at [facility], [address] [place], [place] [address]    Radiology Studies: No results found.  Scheduled Meds:  bisacodyl   10 mg Rectal Once   buPROPion   300 mg Oral Daily   cyclobenzaprine   10 mg Oral QHS   diltiazem   180 mg Oral Daily   gabapentin   300 mg Oral TID   insulin  aspart  0-9 Units Subcutaneous TID WC   insulin  glargine  20 Units Subcutaneous BID   lisinopril   10 mg Oral Daily   pantoprazole   40 mg Oral Daily   PARoxetine   40 mg Oral Daily   polyethylene glycol  17 g Oral BID   rosuvastatin   10 mg Oral Daily   sodium chloride  flush  3 mL Intravenous Q12H   Continuous Infusions:  piperacillin -tazobactam 3.375 g (02/11/24 0616)     LOS: 4 days     Time spent: 50 mins    Darcel Dawley, MD [facility]   If 7PM-7AM, please contact night-coverage  "

## 2024-02-11 NOTE — Consult Note (Signed)
 "                                               Consultation Note   Referring Provider:   [facility] PCP: Dulin, Michael, MD Primary Gastroenterologist:   [facility]      Reason for Consultation: Rectal bleeding ; colitis DOA: 02/07/2024         Hospital Day: 5   ASSESSMENT    63 year old female with SIRS, acute abdominal pain, hematochezia ( ? With some loose stool) and CT angio showing no active bleeding but moderate to severe wall thickening throughout the descending and sigmoid colon with fairly diffuse pericolonic inflammation    Suspect infectious colitis despite negative GI path panel..Patient nontoxic-appearing.  Abdominal pain not resolved but improved.  Leukocytosis resolved.  No further bleeding.  Acute blood loss anemia Self-reported history of iron deficiency anemia Baseline hemoglobin seems to be around 11 , currently hemoglobin is 10 in the setting of GI bleed and IV fluids .  Presenting hemoglobin was 12.7 but suspect this was high from hemoconcentration as it was 10.4 several days prior which is more in keeping with her baseline.   Elevated alkaline phosphatase.  History of cholelithiasis , status post remote postcholecystectomy .  CT scan this admission shows mild intrahepatic and extrahepatic biliary duct dilation . Review of labs shows alk phos elevations dating back a year and elevated as high as 500 at times with  minimal elevation in T. Bilirubin.  Transaminases have periodically been minimally elevated at times as well.   Paroxysmal atrial fibrillation Not anticoagulated  SLE Not on immunosuppressants  GERD Distal esophageal wall thickening on CT scan in May 2025  Hypertension  Diabetes  See PMH for any additional medical history  / medical problems  Principal Problem:   Colitis with rectal bleeding Active Problems:   Anxiety   Essential hypertension   GERD without esophagitis   SIRS (systemic inflammatory response syndrome) (HCC)    Uncontrolled type 2 diabetes mellitus with hyperglycemia, with long-term current use of insulin  (HCC)   Hyponatremia   Hypochloremia   Lupus (systemic lupus erythematosus) (HCC)    PLAN:   --Continue supportive care --Patient would like to advance diet, will try soft diet --On day 5 of Zosyn , should not need more than an additional couple of days of antibiotics -- She will need outpatient colonoscopy 6 to 8 weeks -- Recommend EGD at time of colonoscopy given her reported family history of gastric cancer in mother and also for evaluation of her history of GERD and distal esophageal wall thickening found on CT scan in May 2025.  -- Will check GGT and plan for further evaluation of abnormal liver enzyme as outpatient ( if needed)   HPI   Patient is a 63 year old female who was recently discharged from the hospital after being admitted after being found down at home by her daughter.  She was hospitalized with rhabdomyolysis TITO heriberto polypharmacy.  She improved with IV fluid hydration , medication adjustments . She was discharged home on 01/26/2024.    Patient brought back to the ED 02/07/2024 for evaluation of generalized abdominal pain and bright red blood per rectum.  Symptoms started the day prior several hours after eating fried chicken.  Initially began having generalized abdominal discomfort .  This progressed to more significant generalized abdominal pain .  Patient  subsequently felt she had not have a bowel movement and passed with she thought was soft stool with red blood.  She had several other episodes following that but does not think she passed any stool but rather just red blood.  During these episodes she remembers feeling chilled and then hot /sweaty.    In the ED patient was hemodynamically stable, afebrile.    Admission labs / imaging  notable for:   WBC 19K, presenting hemoglobin 12.7, renal function normal, alkaline phosphatase 150, remainder of LFTs normal  CTA  A/P Moderate to severe wall thickening throughout the descending and sigmoid colon with fairly diffuse pericolonic inflammation                                                     Interval History:  Since admission patient's bleeding has nearly resolved.  Hemoglobin did drift down to 10 with IV fluids .  Leukocytosis  resolved .  She is tolerating clear liquids .  Still having some intermittent generalized abdominal discomfort but much better overall . She has now become constipated. .    Unrelated, patient has a family history of gastric cancer in mother in her 52s.  No details are available.  No known family history of colon cancer.  Patient has never had a colonoscopy   Recent Labs    02/09/24 0558 02/10/24 0347 02/11/24 0443  WBC 11.1* 11.3* 9.4  HGB 10.8* 10.5* 10.0*  HCT 33.6* 32.4* 31.1*  MCV 80.4 80.2 81.2  PLT 319 358 376   Recent Labs    02/09/24 0558 02/10/24 0347 02/11/24 0443  NA 139 141 138  K 3.4* 3.1* 3.7  CL 103 103 101  CO2 27 30 27   GLUCOSE 95 62* 54*  BUN 5* <5* 8  CREATININE 0.57 0.65 0.70  CALCIUM  8.7* 8.9 9.2     CT Angio Abd/Pel W and/or Wo Contrast CLINICAL DATA:  Lower GI bleed  EXAM: CTA ABDOMEN AND PELVIS WITHOUT AND WITH CONTRAST  TECHNIQUE: Initially, a noncontrast CT of the abdomen and pelvis was performed. Subsequently, multidetector CT imaging of the abdomen and pelvis was performed using the standard protocol during bolus administration of intravenous contrast. Multiplanar reconstructed images and MIPs were obtained and reviewed to evaluate the vascular anatomy.  RADIATION DOSE REDUCTION: This exam was performed according to the departmental dose-optimization program which includes automated exposure control, adjustment of the mA and/or kV according to patient size and/or use of iterative reconstruction technique.  CONTRAST:  OMNIPAQUE  IOHEXOL  350 MG/ML SOLN  COMPARISON:  Jul 12, 2023, Jul 09, 2023  FINDINGS: VASCULAR  Aorta: No aortic aneurysm, intramural hematoma, or aortic dissection. Mixed density plaque scattered throughout the abdominal aorta. No hemodynamically significant stenosis.  Celiac: Patent without acute thrombus, aneurysm, or dissection.No hemodynamically significant stenosis.  SMA: Patent without acute thrombus, aneurysm, or dissection.No hemodynamically significant stenosis.  Renals: Patent without acute thrombus, aneurysm, or dissection.Mild narrowing of the proximal right renal artery from mixed density plaque.  IMA: Patent without acute thrombus, aneurysm, or dissection.No hemodynamically significant stenosis.  Inflow: Patent without acute thrombus, aneurysm, or dissection.Minimal scattered calcified plaque without hemodynamically significant stenosis.  Proximal Outflow: The bilateral common femoral and visualized portions of the superficial and profunda femoral arteries are patent without acute thrombus, aneurysm, or dissection.No hemodynamically significant stenosis.  Veins: The  SMV, portal, and hepatic veins are patent without thrombus. The suprarenal IVC is also widely patent.  Review of the MIP images confirms the above findings.  NON-VASCULAR  Lower chest: No focal airspace consolidation or pleural effusion.Fibrolinear scarring and reticulation again noted in both lung bases, unchanged. Minimal bronchiolectasis also present, unchanged.  Hepatobiliary: No mass.Cholecystectomy.Mild dilation of the intrahepatic and extrahepatic bile ducts, likely related to the prior cholecystectomy.  Pancreas: Mild parenchymal atrophy of the pancreatic parenchyma. No mass or ductal dilation.No peripancreatic inflammation or fluid collection.  Spleen: Normal size. No mass.  Adrenals/Urinary Tract: No adrenal masses. No renal mass. No hydronephrosis or nephrolithiasis. The urinary bladder is distended without focal abnormality.  Stomach/Bowel:  Hyperdense ingested material within the stomach. No small bowel wall thickening or inflammation. No small bowel obstruction.The appendix was not visualized. No right lower quadrant or pericecal inflammatory changes to suggest acute appendicitis. Moderate to severe wall thickening throughout the descending and sigmoid colon with fairly diffuse pericolonic inflammation. Scattered colonic diverticulosis also present.  GI Bleed: No extravasation of contrast to suggest active GI bleeding.  Lymphatic: No intraabdominal or pelvic lymphadenopathy.  Reproductive: Age-related atrophy of the uterus and ovaries. No concerning adnexal mass.No free pelvic fluid.  Other: No pneumoperitoneum or ascites.  Musculoskeletal: No acute fracture or destructive lesion.Remote, left-sided rib fractures. Multilevel degenerative disc disease of the spine. Thoracic DISH.  IMPRESSION: VASCULAR  No aortic aneurysm, intramural hematoma, or aortic dissection.  NON-VASCULAR  Moderate to severe wall thickening throughout the descending and sigmoid colon with diffuse pericolonic inflammation, consistent with an infectious or inflammatory colitis. No active GI bleeding.  Aortic Atherosclerosis (ICD10-I70.0).  Electronically Signed   By: Rogelia Myers M.D.   On: 02/07/2024 12:32 DG Chest Port 1 View EXAM: 1 VIEW(S) XRAY OF THE CHEST 02/07/2024 08:50:00 AM  COMPARISON: 01/23/2024  CLINICAL HISTORY: GI bleed  FINDINGS:  LUNGS AND PLEURA: Low lung volumes. Left basilar linear atelectasis/scarring. No pleural effusion. No pneumothorax.  HEART AND MEDIASTINUM: Aortic calcification. No acute abnormality of the cardiac and mediastinal silhouettes.  BONES AND SOFT TISSUES: No acute osseous abnormality.  IMPRESSION: 1. No acute cardiopulmonary abnormality. 2. Low lung volumes with left basilar linear atelectasis/scarring.  Electronically signed by: Norleen Boxer MD 02/07/2024 09:25 AM EST  RP Workstation: [id number]   Previous pertinent Gi studies:  None  Review of Systems: All systems reviewed and negative except where noted in HPI.  Physical Exam: Vital signs in last 24 hours: Temp:  [97.5 F (36.4 C)-98.6 F (37 C)] 98.6 F (37 C) (12/23 1413) Pulse Rate:  [67-90] 90 (12/23 1413) Resp:  [17-18] 18 (12/23 1413) BP: (108-147)/(56-70) 147/60 (12/23 1413) SpO2:  [96 %-98 %] 98 % (12/23 1413) Weight:  [79.8 kg] 79.8 kg (12/23 1100) General:  Pleasant female in NAD Psych:  Cooperative. Normal mood and affect Eyes: Pupils equal Ears:  Normal auditory acuity Nose: No deformity, discharge or lesions Neck:  Supple, no masses felt Lungs:  Clear to auscultation.  Heart:  Regular rate, regular rhythm.  Abdomen:  Soft, nondistended, mild generalized tenderness, active bowel sounds, no masses felt Rectal :  Deferred Msk: Symmetrical without gross deformities.  Neurologic:  Alert, oriented, grossly normal neurologically Extremities : No edema Skin:  Intact without significant lesions.   OUTPATIENT MEDICATIONS Prior to Admission medications  Medication Sig Start Date End Date Taking? Authorizing Provider  amphetamine -dextroamphetamine  (ADDERALL) 10 MG tablet Take 10 mg by mouth daily with breakfast.   Yes [provider]  amphetamine -dextroamphetamine  (ADDERALL) 30  MG tablet Take 1 tablet by mouth daily. Patient taking differently: Take 30 mg by mouth 2 (two) times daily. 08/02/16  Yes Kristy Sharolyn Lenis, PA-C  buPROPion  (WELLBUTRIN  XL) 150 MG 24 hr tablet Take 150 mg by mouth every evening. 05/13/17  Yes [provider]  celecoxib (CELEBREX) 200 MG capsule Take 200 mg by mouth every evening.   Yes [provider]  clonazePAM  (KLONOPIN ) 1 MG tablet Take 1 mg by mouth 2 (two) times daily as needed for anxiety. 03/18/17  Yes [provider]  cyclobenzaprine  (FLEXERIL ) 10 MG tablet Take 10 mg by mouth at bedtime. 12/04/23  Yes [provider]  diclofenac  Sodium (VOLTAREN ) 1 % GEL Apply 4 g topically 4 (four) times daily. Patient taking differently: Apply 4 g topically daily as needed (for pain). 07/12/23  Yes Emil Share, DO  diltiazem  (CARDIZEM  CD) 180 MG 24 hr capsule Take 180 mg by mouth daily. 10/04/23  Yes [provider]  gabapentin  (NEURONTIN ) 300 MG capsule Take 300 mg by mouth 3 (three) times daily. 12/28/22  Yes [provider]  insulin  lispro (HUMALOG ) 100 UNIT/ML KwikPen Inject 3 Units into the skin with breakfast, with lunch, and with evening meal. Plus SSI: CBG 70 - 120: 0 units CBG 121 - 150: 2 units CBG 151 - 200: 3 units CBG 201 - 250: 5 units CBG 251 - 300: 8 units CBG 301 - 350: 11 units CBG 351 - 400: 15 units 07/10/23  Yes Laurence Locus, DO  meclizine  (ANTIVERT ) 25 MG tablet Take 25 mg by mouth 2 (two) times daily as needed for dizziness.   Yes [provider]  omeprazole (PRILOSEC) 40 MG capsule Take 40 mg by mouth daily as needed (for heartburn). 12/01/22  Yes [provider]  ondansetron  (ZOFRAN ) 8 MG tablet Take 8 mg by mouth every 8 (eight) hours as needed for vomiting or nausea.   Yes [provider]  oxyCODONE  (OXY IR/ROXICODONE ) 5 MG immediate release tablet Take 5 mg by mouth 4 (four) times daily as needed for moderate pain (pain score 4-6).   Yes [provider]  PARoxetine  (PAXIL ) 40 MG tablet Take 40 mg by mouth daily. 07/13/22  Yes [provider]  rosuvastatin  (CRESTOR ) 10 MG tablet Take 10 mg by mouth every evening. 12/02/22  Yes [provider]  TRESIBA  FLEXTOUCH 100 UNIT/ML FlexTouch Pen Inject 35 Units into the skin 2 (two) times daily.   Yes [provider]  Insulin  Pen Needle (BD PEN NEEDLE MINI ULTRAFINE) 31G X 5 MM MISC use to inject insulin  as directed 07/10/23   Laurence Locus, DO  lisinopril  (ZESTRIL ) 10 MG tablet Take 1 tablet (10 mg total) by mouth daily. 07/10/23 01/25/24  Laurence Locus, DO  naloxone [facility])  nasal spray 4 mg/0.1 mL Place 1 spray into the nose daily as needed (for overdose). 01/03/24   [provider]    Allergies as of 02/07/2024 - Review Complete 02/07/2024  Allergen Reaction Noted   Sulfa antibiotics Anaphylaxis and Hives 12/15/2011   Metformin  and related Diarrhea 07/05/2016    INPATIENT MEDICATIONS Current Facility-Administered Medications  Medication Dose Route Frequency Provider Last Rate Last Admin   acetaminophen  (TYLENOL ) tablet 650 mg  650 mg Oral Q6H PRN Smith, Rondell A, MD       Or   acetaminophen  (TYLENOL ) suppository 650 mg  650 mg Rectal Q6H PRN Claudene Reeves A, MD       albuterol  (PROVENTIL ) (2.5 MG/3ML) 0.083% nebulizer solution  2.5 mg  2.5 mg Nebulization Q6H PRN Smith, Rondell A, MD       bisacodyl  (DULCOLAX) suppository 10 mg  10 mg Rectal Once Swayze, Ava, DO       buPROPion  (WELLBUTRIN  XL) 24 hr tablet 300 mg  300 mg Oral Daily Claudene, Rondell A, MD   300 mg at 02/11/24 9077   clonazePAM  (KLONOPIN ) tablet 1 mg  1 mg Oral BID PRN Smith, Rondell A, MD   1 mg at 02/11/24 9078   cyclobenzaprine  (FLEXERIL ) tablet 10 mg  10 mg Oral QHS Smith, Rondell A, MD   10 mg at 02/10/24 2158   diclofenac  Sodium (VOLTAREN ) 1 % topical gel 4 g  4 g Topical Daily PRN Smith, Rondell A, MD       diltiazem  (CARDIZEM  CD) 24 hr capsule 180 mg  180 mg Oral Daily Claudene, Rondell A, MD   180 mg at 02/11/24 9077   gabapentin  (NEURONTIN ) capsule 300 mg  300 mg Oral TID Smith, Rondell A, MD   300 mg at 02/11/24 9078   HYDROmorphone  (DILAUDID ) injection 1 mg  1 mg Intravenous Q4H PRN Smith, Rondell A, MD       insulin  aspart (novoLOG ) injection 0-9 Units  0-9 Units Subcutaneous TID WC Leotis Bogus, MD   1 Units at 02/11/24 1204   insulin  glargine (LANTUS ) injection 20 Units  20 Units Subcutaneous BID Leotis Bogus, MD       lisinopril  (ZESTRIL ) tablet 10 mg  10 mg Oral Daily Smith, Rondell A, MD   10 mg at 02/11/24 9078   ondansetron  (ZOFRAN ) tablet 4 mg  4 mg Oral Q6H PRN  Smith, Rondell A, MD   4 mg at 02/10/24 1013   Or   ondansetron  (ZOFRAN ) injection 4 mg  4 mg Intravenous Q6H PRN Claudene Maximino LABOR, MD       oxyCODONE -acetaminophen  (PERCOCET/ROXICET) 5-325 MG per tablet 1 tablet  1 tablet Oral Q6H PRN Claudene Maximino LABOR, MD   1 tablet at 02/11/24 9077   pantoprazole  (PROTONIX ) EC tablet 40 mg  40 mg Oral Daily Smith, Rondell A, MD   40 mg at 02/11/24 9077   PARoxetine  (PAXIL ) tablet 40 mg  40 mg Oral Daily Smith, Rondell A, MD   40 mg at 02/11/24 9078   piperacillin -tazobactam (ZOSYN ) IVPB 3.375 g  3.375 g Intravenous Q8H Smith, Rondell A, MD 12.5 mL/hr at 02/11/24 0616 3.375 g at 02/11/24 0616   polyethylene glycol (MIRALAX  / GLYCOLAX ) packet 17 g  17 g Oral BID Swayze, Ava, DO   17 g at 02/11/24 9074   rosuvastatin  (CRESTOR ) tablet 10 mg  10 mg Oral Daily Smith, Rondell A, MD   10 mg at 02/11/24 9077   simethicone  (MYLICON) chewable tablet 80 mg  80 mg Oral QID PRN Swayze, Ava, DO   80 mg at 02/09/24 1745   sodium chloride  flush (NS) 0.9 % injection 3 mL  3 mL Intravenous Q12H Claudene Maximino A, MD   3 mL at 02/11/24 9072     Past Medical History:  Diagnosis Date   Anxiety    CVA (cerebral vascular accident) (HCC) 01/03/2023   Diabetes mellitus without complication (HCC)    DKA, type 2 (HCC) 07/09/2023   Hypertension     Past Surgical History:  Procedure Laterality Date   APPENDECTOMY     CHOLECYSTECTOMY      History reviewed. No pertinent family history.  Social History   Socioeconomic History   Marital status: Legally  Separated    Spouse name: Not on file   Number of children: Not on file   Years of education: Not on file   Highest education level: Not on file  Occupational History   Not on file  Tobacco Use   Smoking status: Light Smoker    Current packs/day: 0.25    Types: Cigarettes   Smokeless tobacco: Never  Vaping Use   Vaping status: Some Days   Substances: Nicotine , Flavoring  Substance and Sexual Activity   Alcohol use: No    Drug use: No   Sexual activity: Not Currently  Other Topics Concern   Not on file  Social History Narrative   Patient does not have medical insurance   Social Drivers of Health   Tobacco Use: High Risk (02/07/2024)   Patient History    Smoking Tobacco Use: Light Smoker    Smokeless Tobacco Use: Never    Passive Exposure: Not on file  Financial Resource Strain: Not on file  Food Insecurity: Food Insecurity Present (02/08/2024)   Epic    Worried About Programme Researcher, Broadcasting/film/video in the Last Year: Often true    Ran Out of Food in the Last Year: Often true  Transportation Needs: No Transportation Needs (02/08/2024)   Epic    Lack of Transportation (Medical): No    Lack of Transportation (Non-Medical): No  Physical Activity: Not on file  Stress: Not on file  Social Connections: Socially Isolated (02/08/2024)   Social Connection and Isolation Panel    Frequency of Communication with Friends and Family: More than three times a week    Frequency of Social Gatherings with Friends and Family: Once a week    Attends Religious Services: Never    Database Administrator or Organizations: No    Attends Banker Meetings: Never    Marital Status: Divorced  Catering Manager Violence: Not At Risk (02/08/2024)   Epic    Fear of Current or Ex-Partner: No    Emotionally Abused: No    Physically Abused: No    Sexually Abused: No  Depression (PHQ2-9): Not on file  Alcohol Screen: Not on file  Housing: High Risk (02/08/2024)   Epic    Unable to Pay for Housing in the Last Year: Yes    Number of Times Moved in the Last Year: 0    Homeless in the Last Year: No  Utilities: At Risk (02/08/2024)   Epic    Threatened with loss of utilities: Yes  Health Literacy: Not on file    Code Status   Code Status: Do not attempt resuscitation (DNR) PRE-ARREST INTERVENTIONS DESIRED   Vina Dasen, NP-C   02/11/2024, 3:15 PM     "

## 2024-02-11 NOTE — Inpatient Diabetes Management (Signed)
 Inpatient Diabetes Program Recommendations  AACE/ADA: New Consensus Statement on Inpatient Glycemic Control   Target Ranges:  Prepandial:   less than 140 mg/dL      Peak postprandial:   less than 180 mg/dL (1-2 hours)      Critically ill patients:  140 - 180 mg/dL   Lab Results  Component Value Date   GLUCAP 100 (H) 02/11/2024   HGBA1C 11.4 (H) 01/24/2024    Latest Reference Range & Units 02/10/24 06:18 02/10/24 06:44 02/10/24 11:15 02/10/24 16:18 02/10/24 19:39 02/11/24 06:22 02/11/24 06:55 02/11/24 07:17  Glucose-Capillary 70 - 99 mg/dL 65 (L) 890 (H) 738 (H) 198 (H) 250 (H) 51 (L) 64 (L) 100 (H)   Review of Glycemic Control  Diabetes history: DM 2  Outpatient Diabetes medications:  Tresiba  35 units daily Humalog  3 units tid with meals plus correction  Current orders for Inpatient glycemic control:  Semglee  30 units BID Novolog  0-6 units TID   Inpatient Diabetes Program Recommendations:   Noted hypoglycemia the past 2 mornings.   Please consider decreasing Lantus  to 20 units BID and change correction to Novolog  0-9 units TID.   Thanks,  Lavanda Search, RN, MSN, [facility]  Inpatient Diabetes Coordinator  Pager [phone] (8a-5p)

## 2024-02-12 ENCOUNTER — Inpatient Hospital Stay (HOSPITAL_COMMUNITY)

## 2024-02-12 DIAGNOSIS — R748 Abnormal levels of other serum enzymes: Secondary | ICD-10-CM

## 2024-02-12 DIAGNOSIS — K529 Noninfective gastroenteritis and colitis, unspecified: Secondary | ICD-10-CM | POA: Diagnosis not present

## 2024-02-12 DIAGNOSIS — R10816 Epigastric abdominal tenderness: Secondary | ICD-10-CM

## 2024-02-12 DIAGNOSIS — K625 Hemorrhage of anus and rectum: Secondary | ICD-10-CM | POA: Diagnosis not present

## 2024-02-12 LAB — BASIC METABOLIC PANEL WITH GFR
Anion gap: 9 (ref 5–15)
BUN: 11 mg/dL (ref 8–23)
CO2: 29 mmol/L (ref 22–32)
Calcium: 9.3 mg/dL (ref 8.9–10.3)
Chloride: 101 mmol/L (ref 98–111)
Creatinine, Ser: 0.69 mg/dL (ref 0.44–1.00)
GFR, Estimated: >60 mL/min
Glucose, Bld: 213 mg/dL — ABNORMAL HIGH (ref 70–99)
Potassium: 4.1 mmol/L (ref 3.5–5.1)
Sodium: 138 mmol/L (ref 135–145)

## 2024-02-12 LAB — CULTURE, BLOOD (ROUTINE X 2)
Culture: NO GROWTH
Culture: NO GROWTH
Special Requests: ADEQUATE

## 2024-02-12 LAB — PHOSPHORUS: Phosphorus: 3.6 mg/dL (ref 2.5–4.6)

## 2024-02-12 LAB — CBC
HCT: 30.2 % — ABNORMAL LOW (ref 36.0–46.0)
Hemoglobin: 9.7 g/dL — ABNORMAL LOW (ref 12.0–15.0)
MCH: 25.8 pg — ABNORMAL LOW (ref 26.0–34.0)
MCHC: 32.1 g/dL (ref 30.0–36.0)
MCV: 80.3 fL (ref 80.0–100.0)
Platelets: 345 K/uL (ref 150–400)
RBC: 3.76 MIL/uL — ABNORMAL LOW (ref 3.87–5.11)
RDW: 14.2 % (ref 11.5–15.5)
WBC: 8 K/uL (ref 4.0–10.5)
nRBC: 0 % (ref 0.0–0.2)

## 2024-02-12 LAB — IRON AND TIBC
Iron: 23 ug/dL — ABNORMAL LOW (ref 28–170)
Saturation Ratios: 7 % — ABNORMAL LOW (ref 10.4–31.8)
TIBC: 307 ug/dL (ref 250–450)
UIBC: 284 ug/dL

## 2024-02-12 LAB — GLUCOSE, CAPILLARY
Glucose-Capillary: 117 mg/dL — ABNORMAL HIGH (ref 70–99)
Glucose-Capillary: 121 mg/dL — ABNORMAL HIGH (ref 70–99)
Glucose-Capillary: 155 mg/dL — ABNORMAL HIGH (ref 70–99)
Glucose-Capillary: 296 mg/dL — ABNORMAL HIGH (ref 70–99)

## 2024-02-12 LAB — GAMMA GT: GGT: 250 U/L — ABNORMAL HIGH (ref 7–50)

## 2024-02-12 LAB — MAGNESIUM: Magnesium: 1.8 mg/dL (ref 1.7–2.4)

## 2024-02-12 LAB — FERRITIN: Ferritin: 128 ng/mL (ref 11–307)

## 2024-02-12 NOTE — Progress Notes (Signed)
 "    Daily Progress Note  DOA: 02/07/2024 Hospital Day: 6  Cc:  colitis on CT scan , blood in stool , abdominal pain   ASSESSMENT    63 year old female with SIRS, acute abdominal pain, hematochezia ( ? With some loose stool) and CT angio showing no active bleeding but moderate to severe wall thickening throughout the descending and sigmoid colon with fairly diffuse pericolonic inflammation Suspect infectious colitis despite negative GI path panel.   Today Had several BMs with miralax . Stool contained some blood yesterday, and scant amount this am. Overall significantly better. However, she has developed a new abdominal pain in RUQ radiating around to R back. Pain worse with movement , including deep breaths. Musculoskeletal. Abdomen is tender but had generalized tenderness yesterday as well. Non-toxic appearing. Good appetite. Normal WBC.  Acute blood loss anemia Self-reported history of iron deficiency anemia Hgb stable at 9.7   Elevated alkaline phosphatase.  History of cholelithiasis , status post remote postcholecystectomy .  CT scan this admission shows mild intrahepatic and extrahepatic biliary duct dilation . Review of labs shows alk phos elevations dating back a year and elevated as high as 500 at times with  minimal elevation in T. Bilirubin.  Transaminases have periodically been minimally elevated at times as well.    Paroxysmal atrial fibrillation Not anticoagulated   SLE Not on immunosuppressants   Chronic GERD with pyrosis / regurgitation Distal esophageal wall thickening - CT scan  May 2025 On Prilosec at home   Hypertension   Diabetes  Principal Problem:   Colitis with rectal bleeding Active Problems:   Anxiety   Essential hypertension   GERD without esophagitis   SIRS (systemic inflammatory response syndrome) (HCC)   Uncontrolled type 2 diabetes mellitus with hyperglycemia, with long-term current use of insulin  (HCC)   Hyponatremia   Hypochloremia    Lupus (systemic lupus erythematosus) (HCC)    PLAN   --Encouraged to get OOB into bedside chair If pain persists >> consider repeat CT scan --Continue Miralax  twice daily --Continue PPI  --Continue Zosyn  for now --Continue soft diet  --Check iron studies --Await GGT --Eventual outpatient EGD / colonoscopy   Subjective   RUQ pain ( see Assessment) .    Objective    Recent Labs    02/10/24 0347 02/11/24 0443 02/12/24 0309  WBC 11.3* 9.4 8.0  HGB 10.5* 10.0* 9.7*  HCT 32.4* 31.1* 30.2*  MCV 80.2 81.2 80.3  PLT 358 376 345   No results for input(s): FOLATE, VITAMINB12, FERRITIN, TIBC, IRONPCTSAT in the last 72 hours. Recent Labs    02/10/24 0347 02/11/24 0443 02/12/24 0309  NA 141 138 138  K 3.1* 3.7 4.1  CL 103 101 101  CO2 30 27 29   GLUCOSE 62* 54* 213*  BUN <5* 8 11  CREATININE 0.65 0.70 0.69  CALCIUM  8.9 9.2 9.3   No results for input(s): PROT, ALBUMIN, AST, ALT, ALKPHOS, BILITOT, BILIDIR, IBILI in the last 72 hours.   Imaging:  CT Angio Abd/Pel W and/or Wo Contrast CLINICAL DATA:  Lower GI bleed  EXAM: CTA ABDOMEN AND PELVIS WITHOUT AND WITH CONTRAST  TECHNIQUE: Initially, a noncontrast CT of the abdomen and pelvis was performed. Subsequently, multidetector CT imaging of the abdomen and pelvis was performed using the standard protocol during bolus administration of intravenous contrast. Multiplanar reconstructed images and MIPs were obtained and reviewed to evaluate the vascular anatomy.  RADIATION DOSE REDUCTION: This exam was performed according to the departmental dose-optimization program which  includes automated exposure control, adjustment of the mA and/or kV according to patient size and/or use of iterative reconstruction technique.  CONTRAST:  OMNIPAQUE  IOHEXOL  350 MG/ML SOLN  COMPARISON:  Jul 12, 2023, Jul 09, 2023  FINDINGS: VASCULAR  Aorta: No aortic aneurysm, intramural hematoma, or  aortic dissection. Mixed density plaque scattered throughout the abdominal aorta. No hemodynamically significant stenosis.  Celiac: Patent without acute thrombus, aneurysm, or dissection.No hemodynamically significant stenosis.  SMA: Patent without acute thrombus, aneurysm, or dissection.No hemodynamically significant stenosis.  Renals: Patent without acute thrombus, aneurysm, or dissection.Mild narrowing of the proximal right renal artery from mixed density plaque.  IMA: Patent without acute thrombus, aneurysm, or dissection.No hemodynamically significant stenosis.  Inflow: Patent without acute thrombus, aneurysm, or dissection.Minimal scattered calcified plaque without hemodynamically significant stenosis.  Proximal Outflow: The bilateral common femoral and visualized portions of the superficial and profunda femoral arteries are patent without acute thrombus, aneurysm, or dissection.No hemodynamically significant stenosis.  Veins: The SMV, portal, and hepatic veins are patent without thrombus. The suprarenal IVC is also widely patent.  Review of the MIP images confirms the above findings.  NON-VASCULAR  Lower chest: No focal airspace consolidation or pleural effusion.Fibrolinear scarring and reticulation again noted in both lung bases, unchanged. Minimal bronchiolectasis also present, unchanged.  Hepatobiliary: No mass.Cholecystectomy.Mild dilation of the intrahepatic and extrahepatic bile ducts, likely related to the prior cholecystectomy.  Pancreas: Mild parenchymal atrophy of the pancreatic parenchyma. No mass or ductal dilation.No peripancreatic inflammation or fluid collection.  Spleen: Normal size. No mass.  Adrenals/Urinary Tract: No adrenal masses. No renal mass. No hydronephrosis or nephrolithiasis. The urinary bladder is distended without focal abnormality.  Stomach/Bowel: Hyperdense ingested material within the stomach. No small bowel wall thickening  or inflammation. No small bowel obstruction.The appendix was not visualized. No right lower quadrant or pericecal inflammatory changes to suggest acute appendicitis. Moderate to severe wall thickening throughout the descending and sigmoid colon with fairly diffuse pericolonic inflammation. Scattered colonic diverticulosis also present.  GI Bleed: No extravasation of contrast to suggest active GI bleeding.  Lymphatic: No intraabdominal or pelvic lymphadenopathy.  Reproductive: Age-related atrophy of the uterus and ovaries. No concerning adnexal mass.No free pelvic fluid.  Other: No pneumoperitoneum or ascites.  Musculoskeletal: No acute fracture or destructive lesion.Remote, left-sided rib fractures. Multilevel degenerative disc disease of the spine. Thoracic DISH.  IMPRESSION: VASCULAR  No aortic aneurysm, intramural hematoma, or aortic dissection.  NON-VASCULAR  Moderate to severe wall thickening throughout the descending and sigmoid colon with diffuse pericolonic inflammation, consistent with an infectious or inflammatory colitis. No active GI bleeding.  Aortic Atherosclerosis (ICD10-I70.0).  Electronically Signed   By: Rogelia Myers M.D.   On: 02/07/2024 12:32 DG Chest Port 1 View EXAM: 1 VIEW(S) XRAY OF THE CHEST 02/07/2024 08:50:00 AM  COMPARISON: 01/23/2024  CLINICAL HISTORY: GI bleed  FINDINGS:  LUNGS AND PLEURA: Low lung volumes. Left basilar linear atelectasis/scarring. No pleural effusion. No pneumothorax.  HEART AND MEDIASTINUM: Aortic calcification. No acute abnormality of the cardiac and mediastinal silhouettes.  BONES AND SOFT TISSUES: No acute osseous abnormality.  IMPRESSION: 1. No acute cardiopulmonary abnormality. 2. Low lung volumes with left basilar linear atelectasis/scarring.  Electronically signed by: Norleen Boxer MD 02/07/2024 09:25 AM EST RP Workstation: [id number]     Scheduled inpatient medications:   bisacodyl   10 mg  Rectal Once   buPROPion   300 mg Oral Daily   cyclobenzaprine   10 mg Oral QHS   diltiazem   180 mg Oral Daily   gabapentin   300 mg Oral TID   insulin  aspart  0-9 Units Subcutaneous TID WC   insulin  glargine  20 Units Subcutaneous BID   lisinopril   10 mg Oral Daily   pantoprazole   40 mg Oral Daily   PARoxetine   40 mg Oral Daily   polyethylene glycol  17 g Oral BID   rosuvastatin   10 mg Oral Daily   sodium chloride  flush  3 mL Intravenous Q12H   Continuous inpatient infusions:   piperacillin -tazobactam 3.375 g (02/12/24 0542)   PRN inpatient medications: acetaminophen  **OR** acetaminophen , albuterol , clonazePAM , diclofenac  Sodium, hydrocortisone  cream, HYDROmorphone  (DILAUDID ) injection, ondansetron  **OR** ondansetron  (ZOFRAN ) IV, oxyCODONE -acetaminophen , simethicone   Vital signs in last 24 hours: Temp:  [97.5 F (36.4 C)-99 F (37.2 C)] 97.5 F (36.4 C) (12/24 0735) Pulse Rate:  [70-90] 72 (12/24 0735) Resp:  [17-20] 17 (12/24 0735) BP: (128-147)/(60-71) 132/65 (12/24 0735) SpO2:  [96 %-99 %] 97 % (12/24 0735) Weight:  [79.8 kg] 79.8 kg (12/23 1100) Last BM Date : 02/11/24  Intake/Output Summary (Last 24 hours) at 02/12/2024 1048 Last data filed at 02/12/2024 0848 Gross per 24 hour  Intake 3 ml  Output --  Net 3 ml      Physical Exam:  General: Alert female in NAD Heart:  Regular rate and rhythm.  Pulmonary: Normal respiratory effort Abdomen: Soft, nondistended, generalized tenderness. Normal bowel sounds. Extremities: No lower extremity edema  Neurologic: Alert and oriented Psych: Pleasant. Cooperative     LOS: 5 days   Vina Dasen ,NP 02/12/2024, 10:48 AM       "

## 2024-02-12 NOTE — Progress Notes (Signed)
 " PROGRESS NOTE    Angela Burch  FMW:[id number] DOB: April 11, 1960 DOA: 02/07/2024 PCP: Dulin, Michael, MD   Brief Narrative:  This 63 y.o. female with medical history significant for hypertension, hyperlipidemia, atrial flutter, diabetes,  bronchiectasis, lupus, and anxiety presents with abdominal pain and rectal bleeding. CTA of abdomen and pelvis had demonstrated moderate to severe wall thickening throughout the descending and sigmoid colon with diffuse pericolonic inflammation. Lactic acid was reassuring at 0.8.  GI panel is negative, FOBT+, Patient was admitted for further evaluation and started on enteric precautions until C. difficile results are back.  She is started on IV Zosyn .  H&H remains stable.  Assessment & Plan:   Principal Problem:   Colitis with rectal bleeding Active Problems:   SIRS (systemic inflammatory response syndrome) (HCC)   Uncontrolled type 2 diabetes mellitus with hyperglycemia, with long-term current use of insulin  (HCC)   Hyponatremia   Hypochloremia   Lupus (systemic lupus erythematosus) (HCC)   Essential hypertension   Anxiety   GERD without esophagitis  Abdominal Pain: Colitis with rectal bleeding: Patient presented with abdominal pain and rectal bleeding.   Stool guaiacs were positive but hemoglobin was noted to be 12.7.   CT A/P noted moderate to severe wall thickening throughout the descending and sigmoid colon.  Lactic acid was reassuring at 0.8.  Sepsis ruled out. GI panel is negative. ESR is 26 and CRP is 10.4.  Continue empiric antibiotics IV Zosyn  for now. Hemoglobin has decreased from 12.7 > 11.2 > 10.5 > 10.0 >9.7 Continue Oxycodone  as needed for mild to moderate pain respectively. Antiemetics as needed. Gastroenterology consulted.  Continue MiraLAX ,  PPI and Zosyn . Patient has developed new right upper quadrant abdominal pain with  history of cholecystectomy in the past. Obtain RUQ .  If pain persist may consider repeat CT scan.    Diabetes mellitus type 2 with hyperglycemia: Blood glucose elevated at 358 at admission. Last Hb A1c noted to be 11.4 Hypoglycemia overnight with glucose of 62.  Lantus  decreased to 20 units daily CBG q AC with very sensitive SSI. Adjust insulin  regimen as deemed medically appropriate.   Hyponatremia / Hypochloremia: Improved with IV hydration.   Lupus: Patient notes recent diagnosis. She has follow-up appointment with the rheumatologist sometime next year. Continue Gabapentin  Held Celebrex in the setting of acute bleeding.   Essential hypertension: Continue Cardizem  and lisinopril    Anxiety: Continue Wellbutrin , Paxil , and clonazepam  as needed.   GERD: Continue Protonix .  DVT prophylaxis: SCDs Code Status: DNR Family Communication: No family at bedside Disposition Plan:    Status is: Inpatient Remains inpatient appropriate because: Admitted for colitis with rectal bleeding.  H&H slowly drifting down.  She continues to have rectal bleeding.  GI is consulted.  Patient is not medically ready for discharge.    Consultants:  Gastroenterology  Procedures: CTA/P  Antimicrobials:  Anti-infectives (From admission, onward)    Start     Dose/Rate Route Frequency Ordered Stop   02/07/24 2000  piperacillin -tazobactam (ZOSYN ) IVPB 3.375 g        3.375 g 12.5 mL/hr over 240 Minutes Intravenous Every 8 hours 02/07/24 1611     02/07/24 1300  piperacillin -tazobactam (ZOSYN ) IVPB 3.375 g        3.375 g 100 mL/hr over 30 Minutes Intravenous  Once 02/07/24 1254 02/07/24 1400      Subjective: Patient was seen and examined at bedside.  Overnight events noted. She reports having RUQ abdominal pain,  reports diarrhea is improving. Denies any  more bleeding.  Objective: Vitals:   02/11/24 1413 02/11/24 1926 02/12/24 0559 02/12/24 0735  BP: (!) 147/60 128/67 133/71 132/65  Pulse: 90 86 70 72  Resp: 18 17 20 17   Temp: 98.6 F (37 C) 99 F (37.2 C) 98 F (36.7 C) (!) 97.5 F  (36.4 C)  TempSrc:  Oral Oral   SpO2: 98% 99% 96% 97%  Weight:      Height:        Intake/Output Summary (Last 24 hours) at 02/12/2024 1335 Last data filed at 02/12/2024 1241 Gross per 24 hour  Intake 483 ml  Output --  Net 483 ml   Filed Weights   02/11/24 1100  Weight: 79.8 kg    Examination:  General exam: Appears calm and comfortable, not in any acute distress. Respiratory system: CTA Bilaterally. Respiratory effort normal.  RR 16 Cardiovascular system: S1 & S2 heard, RRR. No JVD, murmurs, rubs, gallops or clicks.  Gastrointestinal system: Abdomen is non distended, soft and  Mildly tender RUQ.  Normal bowel sounds heard. Central nervous system: Alert and oriented x 3. No focal neurological deficits. Extremities: No edema, no cyanosis, no clubbing. Skin: No rashes, lesions or ulcers Psychiatry: Judgement and insight appear normal. Mood & affect appropriate.   Data Reviewed: I have personally reviewed following labs and imaging studies  CBC: Recent Labs  Lab 02/07/24 0907 02/07/24 2022 02/08/24 0658 02/09/24 0558 02/10/24 0347 02/11/24 0443 02/12/24 0309  WBC 19.8*  --  15.3* 11.1* 11.3* 9.4 8.0  NEUTROABS 17.7*  --   --   --  7.1 4.9  --   HGB 12.7   < > 11.5* 10.8* 10.5* 10.0* 9.7*  HCT 39.4   < > 35.1* 33.6* 32.4* 31.1* 30.2*  MCV 81.1  --  80.3 80.4 80.2 81.2 80.3  PLT 416*  --  358 319 358 376 345   < > = values in this interval not displayed.   Basic Metabolic Panel: Recent Labs  Lab 02/08/24 0658 02/09/24 0558 02/10/24 0347 02/11/24 0443 02/12/24 0309  NA 136 139 141 138 138  K 3.8 3.4* 3.1* 3.7 4.1  CL 102 103 103 101 101  CO2 25 27 30 27 29   GLUCOSE 175* 95 62* 54* 213*  BUN 10 5* <5* 8 11  CREATININE 0.70 0.57 0.65 0.70 0.69  CALCIUM  8.8* 8.7* 8.9 9.2 9.3  MG  --   --   --   --  1.8  PHOS  --   --   --   --  3.6   GFR: Estimated Creatinine Clearance: 65.5 mL/min (by C-G formula based on SCr of 0.69 mg/dL). Liver Function  Tests: Recent Labs  Lab 02/07/24 0907  AST 26  ALT 35  ALKPHOS 150*  BILITOT 0.5  PROT 7.9  ALBUMIN 4.8   No results for input(s): LIPASE, AMYLASE in the last 168 hours. No results for input(s): AMMONIA in the last 168 hours. Coagulation Profile: Recent Labs  Lab 02/07/24 0907  INR 0.9   Cardiac Enzymes: No results for input(s): CKTOTAL, CKMB, CKMBINDEX, TROPONINI in the last 168 hours. BNP (last 3 results) No results for input(s): PROBNP in the last 8760 hours. HbA1C: No results for input(s): HGBA1C in the last 72 hours. CBG: Recent Labs  Lab 02/11/24 0717 02/11/24 1134 02/11/24 1736 02/12/24 0633 02/12/24 1110  GLUCAP 100* 195* 259* 117* 121*   Lipid Profile: No results for input(s): CHOL, HDL, LDLCALC, TRIG, CHOLHDL, LDLDIRECT in the last 72 hours.  Thyroid Function Tests: No results for input(s): TSH, T4TOTAL, FREET4, T3FREE, THYROIDAB in the last 72 hours. Anemia Panel: No results for input(s): VITAMINB12, FOLATE, FERRITIN, TIBC, IRON, RETICCTPCT in the last 72 hours. Sepsis Labs: Recent Labs  Lab 02/07/24 1325  LATICACIDVEN 0.8    Recent Results (from the past 240 hours)  Culture, blood (routine x 2)     Status: None   Collection Time: 02/07/24  1:15 PM   Specimen: BLOOD RIGHT FOREARM  Result Value Ref Range Status   Specimen Description BLOOD RIGHT FOREARM  Final   Special Requests   Final    BOTTLES DRAWN AEROBIC AND ANAEROBIC Blood Culture results may not be optimal due to an inadequate volume of blood received in culture bottles   Culture   Final    NO GROWTH 5 DAYS Performed at [facility], [address] [place], [place] [address]    Report Status 02/12/2024 FINAL  Final  Culture, blood (routine x 2)     Status: None   Collection Time: 02/07/24  8:22 PM   Specimen: BLOOD  Result Value Ref Range Status   Specimen Description BLOOD RIGHT ANTECUBITAL  Final   Special Requests   Final     BOTTLES DRAWN AEROBIC AND ANAEROBIC Blood Culture adequate volume   Culture   Final    NO GROWTH 5 DAYS Performed at [facility], [address], [place], [place] [address]    Report Status 02/12/2024 FINAL  Final  Gastrointestinal Panel by PCR , Stool     Status: None   Collection Time: 02/08/24  4:16 PM   Specimen: Stool  Result Value Ref Range Status   Campylobacter species NOT DETECTED NOT DETECTED Final   Plesimonas shigelloides NOT DETECTED NOT DETECTED Final   Salmonella species NOT DETECTED NOT DETECTED Final   Yersinia enterocolitica NOT DETECTED NOT DETECTED Final   Vibrio species NOT DETECTED NOT DETECTED Final   Vibrio cholerae NOT DETECTED NOT DETECTED Final   Enteroaggregative E coli (EAEC) NOT DETECTED NOT DETECTED Final   Enteropathogenic E coli (EPEC) NOT DETECTED NOT DETECTED Final   Enterotoxigenic E coli (ETEC) NOT DETECTED NOT DETECTED Final   Shiga like toxin producing E coli (STEC) NOT DETECTED NOT DETECTED Final   Shigella/Enteroinvasive E coli (EIEC) NOT DETECTED NOT DETECTED Final   Cryptosporidium NOT DETECTED NOT DETECTED Final   Cyclospora cayetanensis NOT DETECTED NOT DETECTED Final   Entamoeba histolytica NOT DETECTED NOT DETECTED Final   Giardia lamblia NOT DETECTED NOT DETECTED Final   Adenovirus F40/41 NOT DETECTED NOT DETECTED Final   Astrovirus NOT DETECTED NOT DETECTED Final   Norovirus GI/GII NOT DETECTED NOT DETECTED Final   Rotavirus A NOT DETECTED NOT DETECTED Final   Sapovirus (I, II, IV, and V) NOT DETECTED NOT DETECTED Final    Comment: Performed at [facility], [address], [place], [place] [address]    Radiology Studies: No results found.  Scheduled Meds:  bisacodyl   10 mg Rectal Once   buPROPion   300 mg Oral Daily   cyclobenzaprine   10 mg Oral QHS   diltiazem   180 mg Oral Daily   gabapentin   300 mg Oral TID   insulin  aspart  0-9 Units Subcutaneous TID WC   insulin  glargine  20 Units Subcutaneous BID    lisinopril   10 mg Oral Daily   pantoprazole   40 mg Oral Daily   PARoxetine   40 mg Oral Daily   polyethylene glycol  17  g Oral BID   rosuvastatin   10 mg Oral Daily   sodium chloride  flush  3 mL Intravenous Q12H   Continuous Infusions:  piperacillin -tazobactam 3.375 g (02/12/24 1305)     LOS: 5 days    Time spent: 50 mins    Darcel Dawley, MD [facility]   If 7PM-7AM, please contact night-coverage  "

## 2024-02-12 NOTE — Plan of Care (Signed)

## 2024-02-13 DIAGNOSIS — R748 Abnormal levels of other serum enzymes: Secondary | ICD-10-CM | POA: Diagnosis not present

## 2024-02-13 DIAGNOSIS — K529 Noninfective gastroenteritis and colitis, unspecified: Secondary | ICD-10-CM | POA: Diagnosis not present

## 2024-02-13 DIAGNOSIS — K625 Hemorrhage of anus and rectum: Secondary | ICD-10-CM | POA: Diagnosis not present

## 2024-02-13 DIAGNOSIS — K5900 Constipation, unspecified: Secondary | ICD-10-CM

## 2024-02-13 DIAGNOSIS — R1010 Upper abdominal pain, unspecified: Secondary | ICD-10-CM | POA: Diagnosis not present

## 2024-02-13 LAB — HEPATIC FUNCTION PANEL
ALT: 25 U/L (ref 0–44)
AST: 19 U/L (ref 15–41)
Albumin: 3.6 g/dL (ref 3.5–5.0)
Alkaline Phosphatase: 220 U/L — ABNORMAL HIGH (ref 38–126)
Bilirubin, Direct: <0.1 mg/dL (ref 0.0–0.2)
Total Bilirubin: <0.2 mg/dL (ref 0.0–1.2)
Total Protein: 6 g/dL — ABNORMAL LOW (ref 6.5–8.1)

## 2024-02-13 LAB — CBC
HCT: 30.6 % — ABNORMAL LOW (ref 36.0–46.0)
Hemoglobin: 9.7 g/dL — ABNORMAL LOW (ref 12.0–15.0)
MCH: 26.5 pg (ref 26.0–34.0)
MCHC: 31.7 g/dL (ref 30.0–36.0)
MCV: 83.6 fL (ref 80.0–100.0)
Platelets: 339 K/uL (ref 150–400)
RBC: 3.66 MIL/uL — ABNORMAL LOW (ref 3.87–5.11)
RDW: 14.4 % (ref 11.5–15.5)
WBC: 7.7 K/uL (ref 4.0–10.5)
nRBC: 0 % (ref 0.0–0.2)

## 2024-02-13 LAB — GLUCOSE, CAPILLARY
Glucose-Capillary: 171 mg/dL — ABNORMAL HIGH (ref 70–99)
Glucose-Capillary: 259 mg/dL — ABNORMAL HIGH (ref 70–99)
Glucose-Capillary: 312 mg/dL — ABNORMAL HIGH (ref 70–99)
Glucose-Capillary: 388 mg/dL — ABNORMAL HIGH (ref 70–99)

## 2024-02-13 NOTE — Progress Notes (Signed)
 "   Inpatient Progress Note     Patient Profile/Chief Complaint  63 year old female admitted with SIRS, acute abdominal pain, hematochezia, CT angio showing no active bleeding but moderate to severe wall thickening -suspect infectious colitis despite negative GI pathogen panel.   Interval History   - Right upper quadrant abdominal pain has decreased compared to yesterday - RUQ ultrasound negative - States that she feels constipated and is consuming MiraLAX  - Hemoglobin stable at 9.7    Objective   Vital signs in last 24 hours: Temp:  [97.5 F (36.4 C)-98.2 F (36.8 C)] 97.8 F (36.6 C) (12/25 0743) Pulse Rate:  [71-87] 71 (12/25 0743) Resp:  [15-18] 17 (12/25 0743) BP: (106-129)/(52-70) 129/61 (12/25 0743) SpO2:  [94 %-98 %] 94 % (12/25 0743) Last BM Date : 02/11/24 General:    Alert, resting in bed in no distress Heart:  Regular rate and rhythm; no murmurs Lungs: Respirations even and unlabored, lungs CTA bilaterally Abdomen:  Soft, tender to palpation over right upper hydrant and epigastrium and nondistended. Normal bowel sounds. Extremities:  Without edema. Neurologic:  Alert and oriented,  grossly normal neurologically. Psych:  Cooperative. Normal mood and affect.  Intake/Output from previous day: 12/24 0701 - 12/25 0700 In: 483 [P.O.:480; I.V.:3] Out: -  Intake/Output this shift: No intake/output data recorded.  Lab Results: Recent Labs    02/11/24 0443 02/12/24 0309 02/13/24 0444  WBC 9.4 8.0 7.7  HGB 10.0* 9.7* 9.7*  HCT 31.1* 30.2* 30.6*  PLT 376 345 339   BMET Recent Labs    02/11/24 0443 02/12/24 0309  NA 138 138  K 3.7 4.1  CL 101 101  CO2 27 29  GLUCOSE 54* 213*  BUN 8 11  CREATININE 0.70 0.69  CALCIUM  9.2 9.3   LFT Recent Labs    02/13/24 0444  PROT 6.0*  ALBUMIN 3.6  AST 19  ALT 25  ALKPHOS 220*  BILITOT <0.2  BILIDIR <0.1  IBILI NOT CALCULATED   Lab Results  Component Value Date   GGT 250 (H) 02/12/2024    PT/INR No  results for input(s): LABPROT, INR in the last 72 hours.  Studies/Results: US  Abdomen Limited RUQ (LIVER/GB) Result Date: 02/12/2024 CLINICAL DATA:  Right upper quadrant pain. EXAM: ULTRASOUND ABDOMEN LIMITED RIGHT UPPER QUADRANT COMPARISON:  CT 02/07/2024 FINDINGS: Gallbladder: Surgically absent. Common bile duct: Diameter: 6 mm, normal post cholecystectomy. Liver: No focal lesion identified. Within normal limits in parenchymal echogenicity. The intrahepatic biliary ductal dilatation on CT is not appreciated on the current exam. Portal vein is patent on color Doppler imaging with normal direction of blood flow towards the liver. Other: No right upper quadrant ascites. IMPRESSION: 1. No biliary dilatation post cholecystectomy demonstrated by ultrasound. 2. No explanation for right upper quadrant pain. Electronically Signed   By: Andrea Gasman M.D.   On: 02/12/2024 20:10    Endoscopic Studies: None   Clinical Impression   63 year old female admitted with SIRS, acute abdominal pain, hematochezia, CT angio showing no active bleeding but moderate to severe wall thickening -suspect infectious colitis despite negative GI pathogen panel.  She continues on Zosyn  and has shown overall clinical improvement compared to symptoms at the time of her initial hospitalization.    Yesterday [name] experience severe right upper quadrant abdominal pain after eating meatloaf.  Right upper quadrant ultrasound was reassuring without any acute findings.  Today her pain is improved.  She reports feeling constipated which could be contributing to some of her abdominal discomfort.  Also noted to have persistently elevated alkaline phosphatase with an elevated GGT.  Antimitochondrial antibody drawn to rule out PBC.   Plan  Continue soft diet Continue MiraLAX  twice daily Continue PPI If abdominal pain persists consider dicyclomine  10 to 20 mg p.o. 4 times daily as needed for discomfort Continue Zosyn  for  now Follow-up AMA lab for evaluation of PBC Anticipate outpatient EGD and colonoscopy    LOS: 6 days   Angela Burch  02/13/2024, 12:09 PM  Angela Hausen, MD [facility]  "

## 2024-02-13 NOTE — Progress Notes (Signed)
 " PROGRESS NOTE    Angela Burch  FMW:[id number] DOB: [birth year] DOA: 02/07/2024 PCP: Dulin, Michael, MD   Brief Narrative:  This 63 y.o. female with medical history significant for hypertension, hyperlipidemia, atrial flutter, diabetes,  bronchiectasis, lupus, and anxiety presents with abdominal pain and rectal bleeding. CTA of abdomen and pelvis had demonstrated moderate to severe wall thickening throughout the descending and sigmoid colon with diffuse pericolonic inflammation. Lactic acid was reassuring at 0.8.  GI panel is negative, FOBT+, Patient was admitted for further evaluation and started on enteric precautions until C. difficile results are back.  She is started on IV Zosyn .  H&H remains stable.  Assessment & Plan:   Principal Problem:   Colitis with rectal bleeding Active Problems:   SIRS (systemic inflammatory response syndrome) (HCC)   Uncontrolled type 2 diabetes mellitus with hyperglycemia, with long-term current use of insulin  (HCC)   Hyponatremia   Hypochloremia   Lupus (systemic lupus erythematosus) (HCC)   Essential hypertension   Anxiety   GERD without esophagitis   Elevated alkaline phosphatase level  Abdominal Pain: Colitis with rectal bleeding: Patient presented with abdominal pain and rectal bleeding.   Stool guaiacs were positive but hemoglobin was noted to be 12.7.   CT A/P noted moderate to severe wall thickening throughout the descending and sigmoid colon.  Lactic acid was reassuring at 0.8.  Sepsis ruled out. GI panel is negative. ESR is 26 and CRP is 10.4.  Continue empiric antibiotics IV Zosyn  for now. Hemoglobin has decreased from 12.7 > 11.2 > 10.5 > 10.0 >9.7 Continue Oxycodone  as needed for mild to moderate pain respectively. Antiemetics as needed. Gastroenterology consulted.  Continue MiraLAX ,  PPI and Zosyn . RUQ US  negative.  She reports pain has decreased compared to yesterday. If pain persists consider Dicyclmine 10-20 mg 4 times daily as  needed. Follow-up a.m. labs for evaluation of PBC   Diabetes mellitus type 2 with hyperglycemia: Blood glucose elevated at 358 at admission. Last Hb A1c noted to be 11.4 Hypoglycemia overnight with glucose of 62.  Lantus  decreased to 20 units daily CBG q AC with very sensitive SSI. Adjust insulin  regimen as deemed medically appropriate.   Hyponatremia / Hypochloremia: Improved with IV hydration.   Lupus: Patient notes recent diagnosis. She has follow-up appointment with the rheumatologist sometime next year. Continue Gabapentin  Held Celebrex in the setting of acute bleeding.   Essential hypertension: Continue Cardizem  and lisinopril    Anxiety: Continue Wellbutrin , Paxil , and clonazepam  as needed.   GERD: Continue Protonix .  DVT prophylaxis: SCDs Code Status: DNR Family Communication: No family at bedside Disposition Plan:    Status is: Inpatient Remains inpatient appropriate because: Admitted for colitis with rectal bleeding.  H&H slowly drifting down.  She continues to have rectal bleeding.  GI is consulted.  Patient is not medically ready for discharge.    Consultants:  Gastroenterology  Procedures: CTA/P  Antimicrobials:  Anti-infectives (From admission, onward)    Start     Dose/Rate Route Frequency Ordered Stop   02/07/24 2000  piperacillin -tazobactam (ZOSYN ) IVPB 3.375 g        3.375 g 12.5 mL/hr over 240 Minutes Intravenous Every 8 hours 02/07/24 1611     02/07/24 1300  piperacillin -tazobactam (ZOSYN ) IVPB 3.375 g        3.375 g 100 mL/hr over 30 Minutes Intravenous  Once 02/07/24 1254 02/07/24 1400      Subjective: Patient was seen and examined at bedside.  Overnight events noted. She reports having RUQ abdominal  pain,  reports diarrhea is improving.  Denies any more bleeding.  Objective: Vitals:   02/12/24 1654 02/12/24 2115 02/13/24 0255 02/13/24 0743  BP: (!) 106/52 119/70 (!) 119/54 129/61  Pulse: 71 87 75 71  Resp: 18 18 15 17   Temp:  (!) 97.5 F (36.4 C) 98.2 F (36.8 C) 97.6 F (36.4 C) 97.8 F (36.6 C)  TempSrc:      SpO2: 96% 98% 95% 94%  Weight:      Height:       No intake or output data in the 24 hours ending 02/13/24 1438  Filed Weights   02/11/24 1100  Weight: 79.8 kg    Examination:  General exam: Appears calm and comfortable, not in any acute distress. Respiratory system: CTA Bilaterally. Respiratory effort normal.  RR 14 Cardiovascular system: S1 & S2 heard, RRR. No JVD, murmurs, rubs, gallops or clicks.  Gastrointestinal system: Abdomen is non distended, soft and  Mildly tender RUQ.  Normal bowel sounds heard. Central nervous system: Alert and oriented x 3. No focal neurological deficits. Extremities: No edema, no cyanosis, no clubbing. Skin: No rashes, lesions or ulcers Psychiatry: Judgement and insight appear normal. Mood & affect appropriate.   Data Reviewed: I have personally reviewed following labs and imaging studies  CBC: Recent Labs  Lab 02/07/24 0907 02/07/24 2022 02/09/24 0558 02/10/24 0347 02/11/24 0443 02/12/24 0309 02/13/24 0444  WBC 19.8*   < > 11.1* 11.3* 9.4 8.0 7.7  NEUTROABS 17.7*  --   --  7.1 4.9  --   --   HGB 12.7   < > 10.8* 10.5* 10.0* 9.7* 9.7*  HCT 39.4   < > 33.6* 32.4* 31.1* 30.2* 30.6*  MCV 81.1   < > 80.4 80.2 81.2 80.3 83.6  PLT 416*   < > 319 358 376 345 339   < > = values in this interval not displayed.   Basic Metabolic Panel: Recent Labs  Lab 02/08/24 0658 02/09/24 0558 02/10/24 0347 02/11/24 0443 02/12/24 0309  NA 136 139 141 138 138  K 3.8 3.4* 3.1* 3.7 4.1  CL 102 103 103 101 101  CO2 25 27 30 27 29   GLUCOSE 175* 95 62* 54* 213*  BUN 10 5* <5* 8 11  CREATININE 0.70 0.57 0.65 0.70 0.69  CALCIUM  8.8* 8.7* 8.9 9.2 9.3  MG  --   --   --   --  1.8  PHOS  --   --   --   --  3.6   GFR: Estimated Creatinine Clearance: 65.5 mL/min (by C-G formula based on SCr of 0.69 mg/dL). Liver Function Tests: Recent Labs  Lab 02/07/24 0907  02/13/24 0444  AST 26 19  ALT 35 25  ALKPHOS 150* 220*  BILITOT 0.5 <0.2  PROT 7.9 6.0*  ALBUMIN 4.8 3.6   No results for input(s): LIPASE, AMYLASE in the last 168 hours. No results for input(s): AMMONIA in the last 168 hours. Coagulation Profile: Recent Labs  Lab 02/07/24 0907  INR 0.9   Cardiac Enzymes: No results for input(s): CKTOTAL, CKMB, CKMBINDEX, TROPONINI in the last 168 hours. BNP (last 3 results) No results for input(s): PROBNP in the last 8760 hours. HbA1C: No results for input(s): HGBA1C in the last 72 hours. CBG: Recent Labs  Lab 02/12/24 1110 02/12/24 1652 02/12/24 2114 02/13/24 0617 02/13/24 1215  GLUCAP 121* 155* 296* 171* 259*   Lipid Profile: No results for input(s): CHOL, HDL, LDLCALC, TRIG, CHOLHDL, LDLDIRECT in the last  72 hours. Thyroid Function Tests: No results for input(s): TSH, T4TOTAL, FREET4, T3FREE, THYROIDAB in the last 72 hours. Anemia Panel: No results for input(s): VITAMINB12, FOLATE, FERRITIN, TIBC, IRON, RETICCTPCT in the last 72 hours. Sepsis Labs: Recent Labs  Lab 02/07/24 1325  LATICACIDVEN 0.8    Recent Results (from the past 240 hours)  Culture, blood (routine x 2)     Status: None   Collection Time: 02/07/24  1:15 PM   Specimen: BLOOD RIGHT FOREARM  Result Value Ref Range Status   Specimen Description BLOOD RIGHT FOREARM  Final   Special Requests   Final    BOTTLES DRAWN AEROBIC AND ANAEROBIC Blood Culture results may not be optimal due to an inadequate volume of blood received in culture bottles   Culture   Final    NO GROWTH 5 DAYS Performed at [facility], [address] [place], [place] [address]    Report Status 02/12/2024 FINAL  Final  Culture, blood (routine x 2)     Status: None   Collection Time: 02/07/24  8:22 PM   Specimen: BLOOD  Result Value Ref Range Status   Specimen Description BLOOD RIGHT ANTECUBITAL  Final   Special Requests   Final     BOTTLES DRAWN AEROBIC AND ANAEROBIC Blood Culture adequate volume   Culture   Final    NO GROWTH 5 DAYS Performed at [facility], [address], [place], [place] [address]    Report Status 02/12/2024 FINAL  Final  Gastrointestinal Panel by PCR , Stool     Status: None   Collection Time: 02/08/24  4:16 PM   Specimen: Stool  Result Value Ref Range Status   Campylobacter species NOT DETECTED NOT DETECTED Final   Plesimonas shigelloides NOT DETECTED NOT DETECTED Final   Salmonella species NOT DETECTED NOT DETECTED Final   Yersinia enterocolitica NOT DETECTED NOT DETECTED Final   Vibrio species NOT DETECTED NOT DETECTED Final   Vibrio cholerae NOT DETECTED NOT DETECTED Final   Enteroaggregative E coli (EAEC) NOT DETECTED NOT DETECTED Final   Enteropathogenic E coli (EPEC) NOT DETECTED NOT DETECTED Final   Enterotoxigenic E coli (ETEC) NOT DETECTED NOT DETECTED Final   Shiga like toxin producing E coli (STEC) NOT DETECTED NOT DETECTED Final   Shigella/Enteroinvasive E coli (EIEC) NOT DETECTED NOT DETECTED Final   Cryptosporidium NOT DETECTED NOT DETECTED Final   Cyclospora cayetanensis NOT DETECTED NOT DETECTED Final   Entamoeba histolytica NOT DETECTED NOT DETECTED Final   Giardia lamblia NOT DETECTED NOT DETECTED Final   Adenovirus F40/41 NOT DETECTED NOT DETECTED Final   Astrovirus NOT DETECTED NOT DETECTED Final   Norovirus GI/GII NOT DETECTED NOT DETECTED Final   Rotavirus A NOT DETECTED NOT DETECTED Final   Sapovirus (I, II, IV, and V) NOT DETECTED NOT DETECTED Final    Comment: Performed at [facility], [address], [place], [place] [address]    Radiology Studies: US  Abdomen Limited RUQ (LIVER/GB) Result Date: 02/12/2024 CLINICAL DATA:  Right upper quadrant pain. EXAM: ULTRASOUND ABDOMEN LIMITED RIGHT UPPER QUADRANT COMPARISON:  CT 02/07/2024 FINDINGS: Gallbladder: Surgically absent. Common bile duct: Diameter: 6 mm, normal post cholecystectomy. Liver: No  focal lesion identified. Within normal limits in parenchymal echogenicity. The intrahepatic biliary ductal dilatation on CT is not appreciated on the current exam. Portal vein is patent on color Doppler imaging with normal direction of blood flow towards the liver. Other: No right upper quadrant ascites. IMPRESSION: 1. No biliary dilatation  post cholecystectomy demonstrated by ultrasound. 2. No explanation for right upper quadrant pain. Electronically Signed   By: Andrea Gasman M.D.   On: 02/12/2024 20:10    Scheduled Meds:  bisacodyl   10 mg Rectal Once   buPROPion   300 mg Oral Daily   cyclobenzaprine   10 mg Oral QHS   diltiazem   180 mg Oral Daily   gabapentin   300 mg Oral TID   insulin  aspart  0-9 Units Subcutaneous TID WC   insulin  glargine  20 Units Subcutaneous BID   lisinopril   10 mg Oral Daily   pantoprazole   40 mg Oral Daily   PARoxetine   40 mg Oral Daily   polyethylene glycol  17 g Oral BID   rosuvastatin   10 mg Oral Daily   sodium chloride  flush  3 mL Intravenous Q12H   Continuous Infusions:  piperacillin -tazobactam 3.375 g (02/13/24 0610)     LOS: 6 days    Time spent: 35 mins    Darcel Dawley, MD [facility]   If 7PM-7AM, please contact night-coverage  "

## 2024-02-13 NOTE — Plan of Care (Signed)
" °  Problem: Skin Integrity: Goal: Risk for impaired skin integrity will decrease Outcome: Progressing   Problem: Tissue Perfusion: Goal: Adequacy of tissue perfusion will improve Outcome: Progressing   Problem: Education: Goal: Knowledge of General Education information will improve Description: Including pain rating scale, medication(s)/side effects and non-pharmacologic comfort measures Outcome: Progressing   Problem: Health Behavior/Discharge Planning: Goal: Ability to manage health-related needs will improve Outcome: Progressing   Problem: Clinical Measurements: Goal: Ability to maintain clinical measurements within normal limits will improve Outcome: Progressing Goal: Will remain free from infection Outcome: Progressing Goal: Diagnostic test results will improve Outcome: Progressing   "

## 2024-02-14 ENCOUNTER — Other Ambulatory Visit (HOSPITAL_COMMUNITY): Payer: Self-pay

## 2024-02-14 DIAGNOSIS — K625 Hemorrhage of anus and rectum: Secondary | ICD-10-CM | POA: Diagnosis not present

## 2024-02-14 DIAGNOSIS — R748 Abnormal levels of other serum enzymes: Secondary | ICD-10-CM | POA: Diagnosis not present

## 2024-02-14 DIAGNOSIS — K529 Noninfective gastroenteritis and colitis, unspecified: Secondary | ICD-10-CM | POA: Diagnosis not present

## 2024-02-14 DIAGNOSIS — R1010 Upper abdominal pain, unspecified: Secondary | ICD-10-CM | POA: Diagnosis not present

## 2024-02-14 LAB — GLUCOSE, CAPILLARY
Glucose-Capillary: 216 mg/dL — ABNORMAL HIGH (ref 70–99)
Glucose-Capillary: 210 mg/dL — ABNORMAL HIGH (ref 70–99)
Glucose-Capillary: 308 mg/dL — ABNORMAL HIGH (ref 70–99)
Glucose-Capillary: 254 mg/dL — ABNORMAL HIGH (ref 70–99)

## 2024-02-14 MED ORDER — AMOXICILLIN-POT CLAVULANATE 875-125 MG PO TABS
1.0000 | ORAL_TABLET | Freq: Two times a day (BID) | ORAL | 0 refills | Status: AC
  Filled 2024-02-14: qty 7, 4d supply, fill #0

## 2024-02-14 MED ORDER — FERROUS SULFATE 325 (65 FE) MG PO TBEC: 325.0000 mg | DELAYED_RELEASE_TABLET | ORAL | Status: AC

## 2024-02-14 MED ORDER — INSULIN ASPART 100 UNIT/ML IJ SOLN
0.0000 [IU] | Freq: Three times a day (TID) | INTRAMUSCULAR | Status: DC
  Administered 2024-02-14: 11 [IU] via SUBCUTANEOUS
  Administered 2024-02-14 (×2): 5 [IU] via SUBCUTANEOUS
  Administered 2024-02-15: 3 [IU] via SUBCUTANEOUS
  Administered 2024-02-15: 8 [IU] via SUBCUTANEOUS
  Filled 2024-02-14: qty 3
  Filled 2024-02-14: qty 8
  Filled 2024-02-14: qty 11
  Filled 2024-02-14 (×2): qty 5

## 2024-02-14 MED ORDER — GUAIFENESIN-DM 100-10 MG/5ML PO SYRP
5.0000 mL | ORAL_SOLUTION | ORAL | Status: DC | PRN
  Administered 2024-02-14: 5 mL via ORAL
  Filled 2024-02-14: qty 10

## 2024-02-14 MED ORDER — INSULIN ASPART 100 UNIT/ML IJ SOLN
0.0000 [IU] | Freq: Every day | INTRAMUSCULAR | Status: DC
  Administered 2024-02-14: 3 [IU] via SUBCUTANEOUS
  Filled 2024-02-14: qty 3

## 2024-02-14 MED ORDER — OXYCODONE HCL 5 MG PO TABS
5.0000 mg | ORAL_TABLET | Freq: Four times a day (QID) | ORAL | 0 refills | Status: AC | PRN
  Filled 2024-02-14: qty 14, 4d supply, fill #0

## 2024-02-14 MED ORDER — DICYCLOMINE HCL 20 MG PO TABS
20.0000 mg | ORAL_TABLET | Freq: Three times a day (TID) | ORAL | 0 refills | Status: AC | PRN
  Filled 2024-02-14: qty 20, 7d supply, fill #0

## 2024-02-14 NOTE — Inpatient Diabetes Management (Signed)
 Inpatient Diabetes Program Recommendations  AACE/ADA: New Consensus Statement on Inpatient Glycemic Control   Target Ranges:  Prepandial:   less than 140 mg/dL      Peak postprandial:   less than 180 mg/dL (1-2 hours)      Critically ill patients:  140 - 180 mg/dL   Lab Results  Component Value Date   GLUCAP 210 (H) 02/14/2024   HGBA1C 11.4 (H) 01/24/2024    Review of Glycemic Control  Latest Reference Range & Units 02/13/24 06:17 02/13/24 12:15 02/13/24 17:23 02/13/24 21:53 02/14/24 08:31 02/14/24 11:19  Glucose-Capillary 70 - 99 mg/dL 828 (H) 740 (H) 687 (H) 388 (H) 216 (H) 210 (H)   Diabetes history: DM 2  Outpatient Diabetes medications:  Tresiba  35 units daily Humalog  3 units tid with meals plus correction  Current orders for Inpatient glycemic control:  Lantus  20 units BID Novolog  0-15 units TID + HS  Inpatient Diabetes Program Recommendations:   Note: hypoglycemia on Lantus  30 units bid was reduced to 20 units bid.  -   May need meal coverage (2 units) as trends increase after meals the past 2 days, will still monitor trends would need to reduce correction scale if ordered (either 0-9 or 0-6 unit scales).   Thanks,  Clotilda Bull RN, MSN, BC-ADM Inpatient Diabetes Coordinator Team Pager [phone] (8a-5p)

## 2024-02-14 NOTE — Progress Notes (Signed)
 "   Inpatient Progress Note     Patient Profile/Chief Complaint  63 year old female admitted with SIRS, acute abdominal pain, hematochezia, CT angio showing no active bleeding but moderate to severe wall thickening -suspect infectious colitis despite negative GI pathogen panel.   Interval History   - Abdominal pain continues to improve - Tolerated pork chop yesterday - Stooled after taking MiraLAX  -scant amount of rectal bleeding    Objective   Vital signs in last 24 hours: Temp:  [98 F (36.7 C)-98.3 F (36.8 C)] 98.3 F (36.8 C) (12/26 0750) Pulse Rate:  [78-80] 79 (12/26 0750) Resp:  [16-18] 16 (12/26 0750) BP: (134-149)/(52-70) 149/70 (12/26 0750) SpO2:  [95 %-99 %] 99 % (12/26 0750) Last BM Date : 02/11/24 General:    Alert, resting in bed in no distress Heart:  Regular rate and rhythm; no murmurs Lungs: Respirations even and unlabored, lungs CTA bilaterally Abdomen:  Soft, tender to palpation over right upper hydrant and epigastrium and nondistended. Normal bowel sounds. Extremities:  Without edema. Neurologic:  Alert and oriented,  grossly normal neurologically. Psych:  Cooperative. Normal mood and affect.  Intake/Output from previous day: 12/25 0701 - 12/26 0700 In: 683 [P.O.:580; I.V.:3; IV Piggyback:100] Out: -  Intake/Output this shift: No intake/output data recorded.  Lab Results: Recent Labs    02/12/24 0309 02/13/24 0444  WBC 8.0 7.7  HGB 9.7* 9.7*  HCT 30.2* 30.6*  PLT 345 339   BMET Recent Labs    02/12/24 0309  NA 138  K 4.1  CL 101  CO2 29  GLUCOSE 213*  BUN 11  CREATININE 0.69  CALCIUM  9.3   LFT Recent Labs    02/13/24 0444  PROT 6.0*  ALBUMIN 3.6  AST 19  ALT 25  ALKPHOS 220*  BILITOT <0.2  BILIDIR <0.1  IBILI NOT CALCULATED   Lab Results  Component Value Date   GGT 250 (H) 02/12/2024    PT/INR No results for input(s): LABPROT, INR in the last 72 hours.  Studies/Results: US  Abdomen Limited RUQ  (LIVER/GB) Result Date: 02/12/2024 CLINICAL DATA:  Right upper quadrant pain. EXAM: ULTRASOUND ABDOMEN LIMITED RIGHT UPPER QUADRANT COMPARISON:  CT 02/07/2024 FINDINGS: Gallbladder: Surgically absent. Common bile duct: Diameter: 6 mm, normal post cholecystectomy. Liver: No focal lesion identified. Within normal limits in parenchymal echogenicity. The intrahepatic biliary ductal dilatation on CT is not appreciated on the current exam. Portal vein is patent on color Doppler imaging with normal direction of blood flow towards the liver. Other: No right upper quadrant ascites. IMPRESSION: 1. No biliary dilatation post cholecystectomy demonstrated by ultrasound. 2. No explanation for right upper quadrant pain. Electronically Signed   By: Andrea Gasman M.D.   On: 02/12/2024 20:10    Endoscopic Studies: None   Clinical Impression   63 year old female admitted with SIRS, acute abdominal pain, hematochezia, CT angio showing no active bleeding but moderate to severe wall thickening -suspect infectious colitis despite negative GI pathogen panel.  She continues on Zosyn  and has shown overall clinical improvement compared to symptoms at the time of her initial hospitalization.    Abdominal pain has continued to improve since acute episode 2 days ago.  Right upper quadrant ultrasound was unremarkable.  She has advanced on her diet.  Did note some scant blood in her stool which is not unexpected in the setting of resolving colitis.   Also noted to have persistently elevated alkaline phosphatase with an elevated GGT.  Antimitochondrial antibody drawn to rule out PBC.  Plan  Continue soft diet Continue MiraLAX  twice daily -can cease once constipation has resolved Continue PPI If abdominal pain persists consider dicyclomine  10 to 20 mg p.o. 4 times daily as needed for discomfort Can transition to oral antibiotics at discharge Follow-up AMA lab for evaluation of PBC Anticipate outpatient EGD and  colonoscopy  Per hospitalist, patient is nearing discharge goals which I agree with.  If she is discharged we will coordinate outpatient follow-up with our office.   LOS: 7 days   Inocente CHRISTELLA Hausen  02/14/2024, 11:04 AM  Inocente Hausen, MD [facility]  "

## 2024-02-14 NOTE — Plan of Care (Signed)
  Problem: Fluid Volume: Goal: Ability to maintain a balanced intake and output will improve Outcome: Progressing   Problem: Health Behavior/Discharge Planning: Goal: Ability to manage health-related needs will improve Outcome: Progressing   Problem: Nutritional: Goal: Maintenance of adequate nutrition will improve Outcome: Progressing

## 2024-02-14 NOTE — Hospital Course (Signed)
 This 63 y.o. female with medical history significant for hypertension, hyperlipidemia, atrial flutter, diabetes,  bronchiectasis, lupus, and anxiety presents with abdominal pain and rectal bleeding. CTA of abdomen and pelvis had demonstrated moderate to severe wall thickening throughout the descending and sigmoid colon with diffuse pericolonic inflammation. Lactic acid was reassuring at 0.8.  GI panel is negative, FOBT+, Patient was admitted for further evaluation and started on enteric precautions until C. difficile results are back.  She is started on IV Zosyn .  H&H remains stable.

## 2024-02-14 NOTE — Progress Notes (Signed)
" °  Progress Note   Patient: Angela Burch FMW:[id number] DOB: 06/04/1960 DOA: 02/07/2024     7 DOS: the patient was seen and examined on 02/14/2024 at 8:45AM      Brief hospital course: This 63 y.o. female with medical history significant for hypertension, hyperlipidemia, atrial flutter, diabetes,  bronchiectasis, lupus, and anxiety presents with abdominal pain and rectal bleeding. CTA of abdomen and pelvis had demonstrated moderate to severe wall thickening throughout the descending and sigmoid colon with diffuse pericolonic inflammation. Lactic acid was reassuring at 0.8.  GI panel is negative, FOBT+, Patient was admitted for further evaluation and started on enteric precautions until C. difficile results are back.  She is started on IV Zosyn .  H&H remains stable.      Assessment and Plan:  Suspected infectious colitis Rectal bleeding See summary from 12/25 -Continue Zosyn   Type 2 diabetes with hyperglycemia Glucose somewhat elevated today again - Continue glargine - Continue sliding scale correction, increase dose  History of lupus Recent diagnosis, not currently on medication - Hold Celebrex  Hypertension Blood pressure controlled - Continue lisinopril  diltiazem   ADD - Hold Adderall  Class I obesity BMI 34, complicates care  Anxiety -Continue bupropion , Klonopin , Flexeril , gabapentin , Paxil   Hyponatremia Resolved      Subjective: Patient still has severe right sided abdominal pain, this is crampy and colicky in character.  She has no fever, no confusion.     Physical Exam: BP (!) 115/56 (BP Location: Right Arm)   Pulse 73   Temp 98.3 F (36.8 C) (Oral)   Resp 16   Ht 5' (1.524 m)   Wt 79.8 kg   SpO2 96%   BMI 34.36 kg/m   Adult female, lying in bed, appears uncomfortable and tired RRR, no murmurs, no peripheral edema Respiratory rate normal, lungs clear without rales or wheezes Abdomen soft, diffuse palpation tenderness, guarding  throughout Attention normal, affect normal, judgment and insight appear normal, face symmetric, speech fluent    Data Reviewed: Discussed with GI Basic metabolic panel and CBC reviewed, notable for anemia only Iron deficiency studies normal Right upper quadrant ultrasound normal      Family Communication:     Disposition: Status is: Inpatient Discussed with GI, I think she is close to be ready for discharge, given her continuing abdominal pain, I will plan to observe 24 hours, if stable to improving and oral intake is good tomorrow, likely home        Author: Lonni SHAUNNA Dalton, MD 02/14/2024 5:26 PM  For on call review [email or web address].    "

## 2024-02-15 ENCOUNTER — Other Ambulatory Visit (HOSPITAL_COMMUNITY): Payer: Self-pay

## 2024-02-15 DIAGNOSIS — K529 Noninfective gastroenteritis and colitis, unspecified: Secondary | ICD-10-CM | POA: Diagnosis not present

## 2024-02-15 DIAGNOSIS — K625 Hemorrhage of anus and rectum: Secondary | ICD-10-CM | POA: Diagnosis not present

## 2024-02-15 LAB — CBC
HCT: 33.9 % — ABNORMAL LOW (ref 36.0–46.0)
Hemoglobin: 10.6 g/dL — ABNORMAL LOW (ref 12.0–15.0)
MCH: 26.3 pg (ref 26.0–34.0)
MCHC: 31.3 g/dL (ref 30.0–36.0)
MCV: 84.1 fL (ref 80.0–100.0)
Platelets: 433 K/uL — ABNORMAL HIGH (ref 150–400)
RBC: 4.03 MIL/uL (ref 3.87–5.11)
RDW: 14.4 % (ref 11.5–15.5)
WBC: 11.8 K/uL — ABNORMAL HIGH (ref 4.0–10.5)
nRBC: 0 % (ref 0.0–0.2)

## 2024-02-15 LAB — GLUCOSE, CAPILLARY
Glucose-Capillary: 164 mg/dL — ABNORMAL HIGH (ref 70–99)
Glucose-Capillary: 300 mg/dL — ABNORMAL HIGH (ref 70–99)

## 2024-02-15 NOTE — Progress Notes (Signed)
 Discharge Nurse Summary: DC order noted per MD. DC RN at bedside with patient/daughter. Patient agreeable with discharge plan. AVS printed/reviewed. PIV removed, skin intact. No DME needs. No home meds. TOC meds delivered to the patient. CP/Edu resolved. Telemonitor not present on assessment. All belongings accounted for. Patient wheeled downstairs for discharge by private auto.   Rosario EMERSON Lund, RN

## 2024-02-15 NOTE — Plan of Care (Signed)
   Problem: Education: Goal: Knowledge of General Education information will improve Description: Including pain rating scale, medication(s)/side effects and non-pharmacologic comfort measures Outcome: Progressing   Problem: Pain Managment: Goal: General experience of comfort will improve and/or be controlled Outcome: Progressing   Problem: Safety: Goal: Ability to remain free from injury will improve Outcome: Progressing

## 2024-02-15 NOTE — Discharge Summary (Signed)
" Physician Discharge Summary   Patient: Angela Burch MRN: [id number] DOB: January 06, 1961  Admit date:     02/07/2024  Discharge date: 02/15/2024  Discharge Physician: Lonni SHAUNNA Dalton   PCP: Dulin, Michael, MD     Recommendations at discharge:  Follow-up with GI for infectious colitis Follow-up with GI for colonoscopy and EGD     Discharge Diagnoses: Principal Problem:   Infectious colitis  Active Problems:   Rectal bleeding    Uncontrolled type 2 diabetes mellitus with hyperglycemia, with long-term current use of insulin  (HCC)   Hyponatremia   Lupus (systemic lupus erythematosus) (HCC)   Essential hypertension   Anxiety   GERD without esophagitis   Transaminitis      Hospital Course: 63 y.o. F with HTN, obesity, recent diagnosis SLE not on medication, and hx chronic pain on daily Adderall, Klonopin  and oxycodone  5 mg four times daily who presented with abdominal pain rectal bleeding.      Suspected infectious colitis Rectal bleeding Admitted and CT of the abdomen pelvis showed severe wall thickening throughout the descending sigmoid colon, elevated inflammatory markers.  GI consulted, C. difficile and GI panel negative, but GI suspected infectious colitis, recommended Zosyn .  She was discharged to complete 7 days with Augmentin .  GI recommended close follow-up for EGD and colonoscopy    Transaminitis She had mild transaminitis and elevated alk phos in the hospital.  Right upper quadrant ultrasound rule out cholecystitis.   Type 2 diabetes with hyperglycemia, poorly controlled Glucose was elevated in the hospital, hemoglobin A1c 11.4%.   History of lupus Recent diagnosis, not currently on medication Celebrex was held in the setting of rectal bleeding.  Hypertension Blood pressure normal on lisinopril  and diltiazem    ADD Adderall held in the hospital, no complications   Class I obesity BMI 34, complicates care   Anxiety Stable on bupropion ,  Klonopin , Flexeril , gabapentin , Paxil    Hyponatremia Resolved           The [place]  Controlled Substances Registry was reviewed for this patient prior to discharge.  Consultants: Gastroenterology, [place], Dr. Suzann Procedures performed: CT angiogram of the abdomen and pelvis Disposition: Home Diet recommendation: Soft diet   DISCHARGE MEDICATION: Allergies as of 02/15/2024       Reactions   Sulfa Antibiotics Anaphylaxis, Hives   Metformin  And Related Diarrhea        Medication List     PAUSE taking these medications    celecoxib 200 MG capsule Wait to take this until your doctor or other care provider tells you to start again. Commonly known as: CELEBREX Take 200 mg by mouth every evening.       TAKE these medications    amoxicillin -clavulanate 875-125 MG tablet Commonly known as: AUGMENTIN  Take 1 tablet by mouth 2 (two) times daily.   amphetamine -dextroamphetamine  10 MG tablet Commonly known as: ADDERALL Take 10 mg by mouth daily with breakfast. What changed: Another medication with the same name was changed. Make sure you understand how and when to take each.   amphetamine -dextroamphetamine  30 MG tablet Commonly known as: ADDERALL Take 1 tablet by mouth daily. What changed: when to take this   BD Pen Needle Mini Ultrafine 31G X 5 MM Misc Generic drug: Insulin  Pen Needle use to inject insulin  as directed   buPROPion  150 MG 24 hr tablet Commonly known as: WELLBUTRIN  XL Take 150 mg by mouth every evening.   clonazePAM  1 MG tablet Commonly known as: KLONOPIN  Take 1 mg by mouth 2 (  two) times daily as needed for anxiety.   cyclobenzaprine  10 MG tablet Commonly known as: FLEXERIL  Take 10 mg by mouth at bedtime.   diclofenac  Sodium 1 % Gel Commonly known as: VOLTAREN  Apply 4 g topically 4 (four) times daily. What changed:  when to take this reasons to take this   dicyclomine  20 MG tablet Commonly known as: BENTYL  Take 1 tablet  (20 mg total) by mouth every 8 (eight) hours as needed for spasms.   diltiazem  180 MG 24 hr capsule Commonly known as: CARDIZEM  CD Take 180 mg by mouth daily.   ferrous sulfate  325 (65 FE) MG EC tablet Take 1 tablet (325 mg total) by mouth every other day.   gabapentin  300 MG capsule Commonly known as: NEURONTIN  Take 300 mg by mouth 3 (three) times daily.   insulin  lispro 100 UNIT/ML KwikPen Commonly known as: HUMALOG  Inject 3 Units into the skin with breakfast, with lunch, and with evening meal. Plus SSI: CBG 70 - 120: 0 units CBG 121 - 150: 2 units CBG 151 - 200: 3 units CBG 201 - 250: 5 units CBG 251 - 300: 8 units CBG 301 - 350: 11 units CBG 351 - 400: 15 units   lisinopril  10 MG tablet Commonly known as: ZESTRIL  Take 1 tablet (10 mg total) by mouth daily.   meclizine  25 MG tablet Commonly known as: ANTIVERT  Take 25 mg by mouth 2 (two) times daily as needed for dizziness.   naloxone 4 MG/0.1ML Liqd nasal spray kit Commonly known as: NARCAN Place 1 spray into the nose daily as needed (for overdose).   omeprazole 40 MG capsule Commonly known as: PRILOSEC Take 40 mg by mouth daily as needed (for heartburn).   ondansetron  8 MG tablet Commonly known as: ZOFRAN  Take 8 mg by mouth every 8 (eight) hours as needed for vomiting or nausea.   oxyCODONE  5 MG immediate release tablet Commonly known as: Oxy IR/ROXICODONE  Take 1 tablet (5 mg total) by mouth 4 (four) times daily as needed for moderate pain (pain score 4-6).   PARoxetine  40 MG tablet Commonly known as: PAXIL  Take 40 mg by mouth daily.   rosuvastatin  10 MG tablet Commonly known as: CRESTOR  Take 10 mg by mouth every evening.   Tresiba  FlexTouch 100 UNIT/ML FlexTouch Pen Generic drug: insulin  degludec Inject 35 Units into the skin 2 (two) times daily.         Discharge Instructions     Discharge instructions   Complete by: As directed    **IMPORTANT DISCHARGE INSTRUCTIONS**   From Dr.  Jonel: You were admitted for rectal bleeding and cramps. The gastroenterology team Dr. Suzann was consulted and evaluated your imaging and blood work  Based on the evidence available to us  now, we think this was from infectious colitis (Inflammation of the colon due to infection) The symptoms of colitis typically resolve slowly over about a week time.  You were treated with antibiotics in the hospital, and should take the equivalent Augmentin  for 4 more days (starting tonight)  If you have nausea, take ondansetron  as needed  If you have cramping, and don't want to take your home oxycodone , take dicyclomine /Bentyl  for cramps  Bentyl  is non-opiate, so it won't interact with oxycodone , meclizine  or Zofran  Avoid oxycodone  and clonazepam  at the same time.  Resume your other home medicines Sometimes, NSAIDs like Celebrex can prolong bleeding, so hold off on this until your bleeding stops  Go see your primary doctor in 1 week, have them  check blood levels Have them check your iron level in 6 weeks  Go see Dr. Suzann within 6 weeks for colonoscopy  To bring your iron level up, take ferrous sulfate  325 mg every other day   Increase activity slowly   Complete by: As directed        Discharge Exam: Filed Weights   02/11/24 1100  Weight: 79.8 kg    General: Pt is alert, awake, not in acute distress Cardiovascular: RRR, nl S1-S2, no murmurs appreciated.   No LE edema.   Respiratory: Normal respiratory rate and rhythm.  CTAB without rales or wheezes. Abdominal: Abdomen soft and non-tender.  No distension or HSM.   Neuro/Psych: Strength symmetric in upper and lower extremities.  Judgment and insight appear normal.   Condition at discharge: good  The results of significant diagnostics from this hospitalization (including imaging, microbiology, ancillary and laboratory) are listed below for reference.   Imaging Studies: US  Abdomen Limited RUQ (LIVER/GB) Result Date:  02/12/2024 CLINICAL DATA:  Right upper quadrant pain. EXAM: ULTRASOUND ABDOMEN LIMITED RIGHT UPPER QUADRANT COMPARISON:  CT 02/07/2024 FINDINGS: Gallbladder: Surgically absent. Common bile duct: Diameter: 6 mm, normal post cholecystectomy. Liver: No focal lesion identified. Within normal limits in parenchymal echogenicity. The intrahepatic biliary ductal dilatation on CT is not appreciated on the current exam. Portal vein is patent on color Doppler imaging with normal direction of blood flow towards the liver. Other: No right upper quadrant ascites. IMPRESSION: 1. No biliary dilatation post cholecystectomy demonstrated by ultrasound. 2. No explanation for right upper quadrant pain. Electronically Signed   By: Andrea Gasman M.D.   On: 02/12/2024 20:10   CT Angio Abd/Pel W and/or Wo Contrast Result Date: 02/07/2024 CLINICAL DATA:  Lower GI bleed EXAM: CTA ABDOMEN AND PELVIS WITHOUT AND WITH CONTRAST TECHNIQUE: Initially, a noncontrast CT of the abdomen and pelvis was performed. Subsequently, multidetector CT imaging of the abdomen and pelvis was performed using the standard protocol during bolus administration of intravenous contrast. Multiplanar reconstructed images and MIPs were obtained and reviewed to evaluate the vascular anatomy. RADIATION DOSE REDUCTION: This exam was performed according to the departmental dose-optimization program which includes automated exposure control, adjustment of the mA and/or kV according to patient size and/or use of iterative reconstruction technique. CONTRAST:  OMNIPAQUE  IOHEXOL  350 MG/ML SOLN COMPARISON:  Jul 12, 2023, Jul 09, 2023 FINDINGS: VASCULAR Aorta: No aortic aneurysm, intramural hematoma, or aortic dissection. Mixed density plaque scattered throughout the abdominal aorta. No hemodynamically significant stenosis. Celiac: Patent without acute thrombus, aneurysm, or dissection.No hemodynamically significant stenosis. SMA: Patent without acute thrombus,  aneurysm, or dissection.No hemodynamically significant stenosis. Renals: Patent without acute thrombus, aneurysm, or dissection.Mild narrowing of the proximal right renal artery from mixed density plaque. IMA: Patent without acute thrombus, aneurysm, or dissection.No hemodynamically significant stenosis. Inflow: Patent without acute thrombus, aneurysm, or dissection.Minimal scattered calcified plaque without hemodynamically significant stenosis. Proximal Outflow: The bilateral common femoral and visualized portions of the superficial and profunda femoral arteries are patent without acute thrombus, aneurysm, or dissection.No hemodynamically significant stenosis. Veins: The SMV, portal, and hepatic veins are patent without thrombus. The suprarenal IVC is also widely patent. Review of the MIP images confirms the above findings. NON-VASCULAR Lower chest: No focal airspace consolidation or pleural effusion.Fibrolinear scarring and reticulation again noted in both lung bases, unchanged. Minimal bronchiolectasis also present, unchanged. Hepatobiliary: No mass.Cholecystectomy.Mild dilation of the intrahepatic and extrahepatic bile ducts, likely related to the prior cholecystectomy. Pancreas: Mild parenchymal atrophy of the pancreatic parenchyma.  No mass or ductal dilation.No peripancreatic inflammation or fluid collection. Spleen: Normal size. No mass. Adrenals/Urinary Tract: No adrenal masses. No renal mass. No hydronephrosis or nephrolithiasis. The urinary bladder is distended without focal abnormality. Stomach/Bowel: Hyperdense ingested material within the stomach. No small bowel wall thickening or inflammation. No small bowel obstruction.The appendix was not visualized. No right lower quadrant or pericecal inflammatory changes to suggest acute appendicitis. Moderate to severe wall thickening throughout the descending and sigmoid colon with fairly diffuse pericolonic inflammation. Scattered colonic diverticulosis also  present. GI Bleed: No extravasation of contrast to suggest active GI bleeding. Lymphatic: No intraabdominal or pelvic lymphadenopathy. Reproductive: Age-related atrophy of the uterus and ovaries. No concerning adnexal mass.No free pelvic fluid. Other: No pneumoperitoneum or ascites. Musculoskeletal: No acute fracture or destructive lesion.Remote, left-sided rib fractures. Multilevel degenerative disc disease of the spine. Thoracic DISH. IMPRESSION: VASCULAR No aortic aneurysm, intramural hematoma, or aortic dissection. NON-VASCULAR Moderate to severe wall thickening throughout the descending and sigmoid colon with diffuse pericolonic inflammation, consistent with an infectious or inflammatory colitis. No active GI bleeding. Aortic Atherosclerosis (ICD10-I70.0). Electronically Signed   By: Rogelia Myers M.D.   On: 02/07/2024 12:32   DG Chest Port 1 View Result Date: 02/07/2024 EXAM: 1 VIEW(S) XRAY OF THE CHEST 02/07/2024 08:50:00 AM COMPARISON: 01/23/2024 CLINICAL HISTORY: GI bleed FINDINGS: LUNGS AND PLEURA: Low lung volumes. Left basilar linear atelectasis/scarring. No pleural effusion. No pneumothorax. HEART AND MEDIASTINUM: Aortic calcification. No acute abnormality of the cardiac and mediastinal silhouettes. BONES AND SOFT TISSUES: No acute osseous abnormality. IMPRESSION: 1. No acute cardiopulmonary abnormality. 2. Low lung volumes with left basilar linear atelectasis/scarring. Electronically signed by: Norleen Boxer MD 02/07/2024 09:25 AM EST RP Workstation: [id number]   DG Chest Portable 1 View Result Date: 01/23/2024 EXAM: 1 VIEW(S) XRAY OF THE CHEST 01/23/2024 12:37:00 PM COMPARISON: 06/10/2016 CLINICAL HISTORY: ?pna pna FINDINGS: LUNGS AND PLEURA: Minimal left lateral basilar subsegmental atelectasis or scarring is noted. No pleural effusion. No pneumothorax. HEART AND MEDIASTINUM: No acute abnormality of the cardiac and mediastinal silhouettes. BONES AND SOFT TISSUES: No acute osseous abnormality.  IMPRESSION: 1. Minimal left lateral basilar subsegmental atelectasis or scarring. Electronically signed by: Lynwood Seip MD 01/23/2024 01:27 PM EST RP Workstation: [id number]   CT Cervical Spine Wo Contrast Result Date: 01/23/2024 EXAM: CT CERVICAL SPINE WITHOUT CONTRAST 01/23/2024 10:25:00 AM TECHNIQUE: CT of the cervical spine was performed without the administration of intravenous contrast. Multiplanar reformatted images are provided for review. Automated exposure control, iterative reconstruction, and/or weight based adjustment of the mA/kV was utilized to reduce the radiation dose to as low as reasonably achievable. COMPARISON: CT head reported separately today. CLINICAL HISTORY: Female patient. Fall, found down. FINDINGS: CERVICAL SPINE: BONES AND ALIGNMENT: No acute traumatic injury identified in the cervical spine. No acute fracture or traumatic malalignment. Relatively preserved cervical lordosis. Mild visible upper thoracic scoliosis. Visible upper thoracic levels appear grossly intact. DEGENERATIVE CHANGES: Cervical spine degeneration including degenerative facet ankylosis on the left at C4-C5. Bulky and partially calcified ligamentous degenerative hypertrophy about the odontoid. SOFT TISSUES: No prevertebral soft tissue swelling. Calcified cervical carotid atherosclerosis. Otherwise negative visible non-contrast neck soft tissues. Carious posterior left mandible molar. Non-contrast visible upper chest remarkable for calcified coronary artery and aortic atherosclerosis. IMPRESSION: 1. No acute traumatic injury identified in the cervical spine. 2. 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 Electronically signed by: Helayne Hurst MD 01/23/2024 10:47 AM EST RP Workstation: [id number]   CT Lumbar Spine  Wo Contrast Result Date: 01/23/2024 EXAM: CT OF THE LUMBAR SPINE WITHOUT CONTRAST 01/23/2024 10:25:00 AM TECHNIQUE: CT of the lumbar spine was  performed without the administration of intravenous contrast. Multiplanar reformatted images are provided for review. Automated exposure control, iterative reconstruction, and/or weight based adjustment of the mA/kV was utilized to reduce the radiation dose to as low as reasonably achievable. COMPARISON: Lumbar spine CT 06/12/2023. CLINICAL HISTORY: 63 year old female status post fall, found down. FINDINGS: Normal lumbar segmentation. BONES AND ALIGNMENT: Chronic generalized osteopenia. Normal vertebral body heights. Stable vertebral height. Chronic grade 1 anterolisthesis of L4 on L5 is stable. Normal alignment otherwise. Partially visible hyperostosis in the lower thoracic spine. Partially visible healed chronic left posterior rib fractures (left 10th rib series 4 image 11). No acute fracture or suspicious bone lesion. SOFT TISSUES: Respiratory motion artifact, atelectasis and scarring at the visible costophrenic angles. Chronic cholecystectomy. Calcified abdominal aorta and iliac artery atherosclerosis. Distended urinary bladder. Otherwise negative visible non-contrast abdominal and pelvic viscera. No acute lumbar paraspinal soft tissue abnormality. DEGENERATIVE CHANGES: Capacious CT appearance of the spinal canal from the thoracolumbar junction to L3-L4. L4-L5: Chronic grade 1 anterolisthesis with circumferential disk osteophyte complex, bulky facet and ligamentum flavum hypertrophy. Mild epidural lipomatosis. Combined, moderate multifactorial spinal stenosis is suspected (sagittal image 27), stable. L5-S1: Chronic severe facet arthropathy greater on the right. Vacuum facet on that side as progressed. Chronic epidural lipomatosis. IMPRESSION: 1. No acute traumatic injury identified in the lumbar spine. 2. Chronic lumbar spine degeneration, especially lower lumbar facet arthropathy in the setting of grade 1 anterolisthesis L4 on L5. Electronically signed by: Helayne Hurst MD 01/23/2024 10:44 AM EST RP Workstation:  [id number]   CT Head Wo Contrast Result Date: 01/23/2024 EXAM: CT HEAD WITHOUT CONTRAST 01/23/2024 10:25:00 AM TECHNIQUE: CT of the head was performed without the administration of intravenous contrast. Automated exposure control, iterative reconstruction, and/or weight based adjustment of the mA/kV was utilized to reduce the radiation dose to as low as reasonably achievable. COMPARISON: Brain MRI 11/15/YYYY. Head CT 11/14/YYYY. CLINICAL HISTORY: 63 year old female status post fall at home, found down. Pain. FINDINGS: BRAIN AND VENTRICLES: No acute hemorrhage. No evidence of acute infarct. No hydrocephalus. No extra-axial collection. No mass effect or midline shift. Brain volume is stable, within normal limits for age. Chronic asymmetry of the left lateral ventricle occipital horn appears to be a normal variant. Chronic cerebral white matter disease more pronounced in the left hemisphere appears stable. No suspicious intracranial vascular hyperdensity. Calcified atherosclerosis at the skull base. ORBITS: No acute abnormality. SINUSES: Paranasal sinuses, tympanic cavities and mastoids remain well aerated. SOFT TISSUES AND SKULL: No acute soft tissue abnormality. No skull fracture. IMPRESSION: 1. No acute traumatic injury or acute intracranial abnormality identified. 2. Stable chronic cerebral white matter disease. Electronically signed by: Helayne Hurst MD 01/23/2024 10:40 AM EST RP Workstation: [id number]    Microbiology: Results for orders placed or performed during the hospital encounter of 02/07/24  Culture, blood (routine x 2)     Status: None   Collection Time: 02/07/24  1:15 PM   Specimen: BLOOD RIGHT FOREARM  Result Value Ref Range Status   Specimen Description BLOOD RIGHT FOREARM  Final   Special Requests   Final    BOTTLES DRAWN AEROBIC AND ANAEROBIC Blood Culture results may not be optimal due to an inadequate volume of blood received in culture bottles   Culture   Final    NO GROWTH 5  DAYS Performed at [facility], [address], [place], [place] [address]    Report Status 02/12/2024 FINAL  Final  Culture, blood (routine x 2)     Status: None   Collection Time: 02/07/24  8:22 PM   Specimen: BLOOD  Result Value Ref Range Status   Specimen Description BLOOD RIGHT ANTECUBITAL  Final   Special Requests   Final    BOTTLES DRAWN AEROBIC AND ANAEROBIC Blood Culture adequate volume   Culture   Final    NO GROWTH 5 DAYS Performed at [facility], [address], [place], [place] [address]    Report Status 02/12/2024 FINAL  Final  Gastrointestinal Panel by PCR , Stool     Status: None   Collection Time: 02/08/24  4:16 PM   Specimen: Stool  Result Value Ref Range Status   Campylobacter species NOT DETECTED NOT DETECTED Final   Plesimonas shigelloides NOT DETECTED NOT DETECTED Final   Salmonella species NOT DETECTED NOT DETECTED Final   Yersinia enterocolitica NOT DETECTED NOT DETECTED Final   Vibrio species NOT DETECTED NOT DETECTED Final   Vibrio cholerae NOT DETECTED NOT DETECTED Final   Enteroaggregative E coli (EAEC) NOT DETECTED NOT DETECTED Final   Enteropathogenic E coli (EPEC) NOT DETECTED NOT DETECTED Final   Enterotoxigenic E coli (ETEC) NOT DETECTED NOT DETECTED Final   Shiga like toxin producing E coli (STEC) NOT DETECTED NOT DETECTED Final   Shigella/Enteroinvasive E coli (EIEC) NOT DETECTED NOT DETECTED Final   Cryptosporidium NOT DETECTED NOT DETECTED Final   Cyclospora cayetanensis NOT DETECTED NOT DETECTED Final   Entamoeba histolytica NOT DETECTED NOT DETECTED Final   Giardia lamblia NOT DETECTED NOT DETECTED Final   Adenovirus F40/41 NOT DETECTED NOT DETECTED Final   Astrovirus NOT DETECTED NOT DETECTED Final   Norovirus GI/GII NOT DETECTED NOT DETECTED Final   Rotavirus A NOT DETECTED NOT DETECTED Final   Sapovirus (I, II, IV, and V) NOT DETECTED NOT DETECTED Final    Comment: Performed at [facility], [address]  [place], [place] [address]    Labs: CBC: Recent Labs  Lab 02/10/24 0347 02/11/24 0443 02/12/24 0309 02/13/24 0444 02/15/24 0727  WBC 11.3* 9.4 8.0 7.7 11.8*  NEUTROABS 7.1 4.9  --   --   --   HGB 10.5* 10.0* 9.7* 9.7* 10.6*  HCT 32.4* 31.1* 30.2* 30.6* 33.9*  MCV 80.2 81.2 80.3 83.6 84.1  PLT 358 376 345 339 433*   Basic Metabolic Panel: Recent Labs  Lab 02/09/24 0558 02/10/24 0347 02/11/24 0443 02/12/24 0309  NA 139 141 138 138  K 3.4* 3.1* 3.7 4.1  CL 103 103 101 101  CO2 27 30 27 29   GLUCOSE 95 62* 54* 213*  BUN 5* <5* 8 11  CREATININE 0.57 0.65 0.70 0.69  CALCIUM  8.7* 8.9 9.2 9.3  MG  --   --   --  1.8  PHOS  --   --   --  3.6   Liver Function Tests: Recent Labs  Lab 02/13/24 0444  AST 19  ALT 25  ALKPHOS 220*  BILITOT <0.2  PROT 6.0*  ALBUMIN 3.6   CBG: Recent Labs  Lab 02/14/24 0831 02/14/24 1119 02/14/24 1613 02/14/24 2055 02/15/24 0531  GLUCAP 216* 210* 308* 254* 164*    Discharge time spent: approximately 45 minutes spent on discharge counseling, evaluation of patient on day of discharge, and coordination of discharge planning with nursing, social work, pharmacy and case management  Signed: Lonni SHAUNNA Dalton, MD [facility]  02/15/2024 ° ° ° ° ° ° °  °"

## 2024-02-16 LAB — MITOCHONDRIAL ANTIBODIES: Mitochondrial M2 Ab, IgG: <20 U (ref 0.0–20.0)

## 2024-02-17 ENCOUNTER — Encounter: Payer: Self-pay | Admitting: Gastroenterology

## 2024-02-20 ENCOUNTER — Ambulatory Visit: Payer: Self-pay | Admitting: Pediatrics

## 2024-03-09 NOTE — Progress Notes (Unsigned)
 Angela Burch

## 2024-03-10 ENCOUNTER — Ambulatory Visit: Admitting: Gastroenterology

## 2024-05-11 ENCOUNTER — Ambulatory Visit: Admitting: Diagnostic Neuroimaging
